# Patient Record
Sex: Female | Born: 1965
Health system: Southern US, Community
[De-identification: ages and names within clinical notes are randomized; demographics above are authoritative.]

## PROBLEM LIST (undated history)

## (undated) DIAGNOSIS — M4310 Spondylolisthesis, site unspecified: Secondary | ICD-10-CM

## (undated) DIAGNOSIS — F329 Major depressive disorder, single episode, unspecified: Secondary | ICD-10-CM

## (undated) DIAGNOSIS — F32A Depression, unspecified: Secondary | ICD-10-CM

## (undated) DIAGNOSIS — R197 Diarrhea, unspecified: Secondary | ICD-10-CM

## (undated) DIAGNOSIS — G8929 Other chronic pain: Secondary | ICD-10-CM

## (undated) DIAGNOSIS — M545 Low back pain, unspecified: Secondary | ICD-10-CM

## (undated) DIAGNOSIS — F419 Anxiety disorder, unspecified: Secondary | ICD-10-CM

## (undated) DIAGNOSIS — M79605 Pain in left leg: Secondary | ICD-10-CM

## (undated) DIAGNOSIS — R748 Abnormal levels of other serum enzymes: Secondary | ICD-10-CM

## (undated) DIAGNOSIS — K589 Irritable bowel syndrome without diarrhea: Secondary | ICD-10-CM

## (undated) DIAGNOSIS — M810 Age-related osteoporosis without current pathological fracture: Secondary | ICD-10-CM

## (undated) DIAGNOSIS — M51369 Other intervertebral disc degeneration, lumbar region without mention of lumbar back pain or lower extremity pain: Secondary | ICD-10-CM

## (undated) DIAGNOSIS — M5136 Other intervertebral disc degeneration, lumbar region: Secondary | ICD-10-CM

## (undated) HISTORY — DX: Low back pain, unspecified: M54.50

## (undated) HISTORY — DX: Diarrhea, unspecified: R19.7

## (undated) HISTORY — PX: TOTAL ABDOMINAL HYSTERECTOMY: SHX209

## (undated) HISTORY — PX: OOPHORECTOMY: SHX86

## (undated) HISTORY — DX: Other intervertebral disc degeneration, lumbar region: M51.36

## (undated) HISTORY — DX: Other intervertebral disc degeneration, lumbar region without mention of lumbar back pain or lower extremity pain: M51.369

## (undated) HISTORY — DX: Other chronic pain: G89.29

## (undated) HISTORY — PX: CHOLECYSTECTOMY: SHX55

## (undated) HISTORY — DX: Age-related osteoporosis without current pathological fracture: M81.0

## (undated) HISTORY — DX: Abnormal levels of other serum enzymes: R74.8

## (undated) HISTORY — DX: Spondylolisthesis, site unspecified: M43.10

## (undated) HISTORY — PX: BREAST LUMPECTOMY: SHX2

---

## 1998-11-16 HISTORY — PX: BACK SURGERY: SHX140

## 2001-06-24 ENCOUNTER — Encounter: Payer: Self-pay | Admitting: Neurology

## 2001-06-24 ENCOUNTER — Encounter: Admission: RE | Admit: 2001-06-24 | Discharge: 2001-06-24 | Payer: Self-pay | Admitting: Neurology

## 2001-06-28 ENCOUNTER — Encounter: Payer: Self-pay | Admitting: Neurology

## 2001-06-28 ENCOUNTER — Encounter: Admission: RE | Admit: 2001-06-28 | Discharge: 2001-06-28 | Payer: Self-pay | Admitting: Neurology

## 2004-07-12 ENCOUNTER — Emergency Department (HOSPITAL_COMMUNITY): Admission: EM | Admit: 2004-07-12 | Discharge: 2004-07-12 | Payer: Self-pay | Admitting: Emergency Medicine

## 2006-03-25 ENCOUNTER — Ambulatory Visit (HOSPITAL_COMMUNITY): Admission: RE | Admit: 2006-03-25 | Discharge: 2006-03-25 | Payer: Self-pay | Admitting: Neurosurgery

## 2008-02-15 ENCOUNTER — Encounter: Payer: Self-pay | Admitting: Obstetrics & Gynecology

## 2008-02-15 ENCOUNTER — Ambulatory Visit (HOSPITAL_COMMUNITY): Admission: RE | Admit: 2008-02-15 | Discharge: 2008-02-15 | Payer: Self-pay | Admitting: Obstetrics & Gynecology

## 2009-09-02 ENCOUNTER — Ambulatory Visit: Payer: Self-pay | Admitting: Occupational Medicine

## 2009-09-02 LAB — CONVERTED CEMR LAB
Basophils Absolute: 0 10*3/uL
Basophils Relative: 0 %
Eosinophils Absolute: 0.2 10*3/uL
Eosinophils Relative: 2 %
HCT: 47 % — ABNORMAL HIGH
Hemoglobin: 15.9 g/dL — ABNORMAL HIGH
Lymphocytes Relative: 35 %
Lymphs Abs: 3.6 10*3/uL
MCHC: 33.8 g/dL
MCV: 99 fL
Monocytes Absolute: 0.2 10*3/uL
Monocytes Relative: 2 % — ABNORMAL LOW
Neutro Abs: 6.2 10*3/uL
Neutrophils Relative %: 61 %
Platelets: 183 10*3/uL
RBC: 4.75 M/uL
RDW: 13.2 %
WBC: 10.2 10*3/uL

## 2009-11-05 ENCOUNTER — Ambulatory Visit (HOSPITAL_COMMUNITY): Admission: RE | Admit: 2009-11-05 | Discharge: 2009-11-05 | Payer: Self-pay | Admitting: Occupational Medicine

## 2009-12-17 ENCOUNTER — Ambulatory Visit (HOSPITAL_COMMUNITY): Admission: RE | Admit: 2009-12-17 | Discharge: 2009-12-17 | Payer: Self-pay | Admitting: Neurology

## 2009-12-24 ENCOUNTER — Emergency Department (HOSPITAL_COMMUNITY): Admission: EM | Admit: 2009-12-24 | Discharge: 2009-12-25 | Payer: Self-pay | Admitting: Emergency Medicine

## 2010-08-10 ENCOUNTER — Emergency Department (HOSPITAL_COMMUNITY): Admission: EM | Admit: 2010-08-10 | Discharge: 2010-08-10 | Payer: Self-pay | Admitting: Emergency Medicine

## 2010-10-15 LAB — HM MAMMOGRAPHY

## 2010-11-18 ENCOUNTER — Inpatient Hospital Stay (HOSPITAL_COMMUNITY): Admission: EM | Admit: 2010-11-18 | Discharge: 2010-11-22 | Payer: Self-pay | Source: Home / Self Care

## 2010-11-19 LAB — DIFFERENTIAL
Basophils Absolute: 0 10*3/uL (ref 0.0–0.1)
Basophils Relative: 0 % (ref 0–1)
Eosinophils Absolute: 0.1 10*3/uL (ref 0.0–0.7)
Eosinophils Relative: 3 % (ref 0–5)
Lymphocytes Relative: 9 % — ABNORMAL LOW (ref 12–46)
Lymphs Abs: 0.4 10*3/uL — ABNORMAL LOW (ref 0.7–4.0)
Monocytes Absolute: 0.7 10*3/uL (ref 0.1–1.0)
Monocytes Relative: 17 % — ABNORMAL HIGH (ref 3–12)
Neutro Abs: 3 10*3/uL (ref 1.7–7.7)
Neutrophils Relative %: 72 % (ref 43–77)

## 2010-11-19 LAB — CBC
HCT: 34.7 % — ABNORMAL LOW (ref 36.0–46.0)
Hemoglobin: 11.7 g/dL — ABNORMAL LOW (ref 12.0–15.0)
MCH: 31.7 pg (ref 26.0–34.0)
MCHC: 33.7 g/dL (ref 30.0–36.0)
MCV: 94 fL (ref 78.0–100.0)
Platelets: 132 10*3/uL — ABNORMAL LOW (ref 150–400)
RBC: 3.69 MIL/uL — ABNORMAL LOW (ref 3.87–5.11)
RDW: 13.6 % (ref 11.5–15.5)
WBC: 4.2 10*3/uL (ref 4.0–10.5)

## 2010-11-19 LAB — IRON AND TIBC
Iron: 35 ug/dL — ABNORMAL LOW (ref 42–135)
Saturation Ratios: 13 % — ABNORMAL LOW (ref 20–55)
TIBC: 269 ug/dL (ref 250–470)
UIBC: 234 ug/dL

## 2010-11-19 LAB — URINALYSIS, ROUTINE W REFLEX MICROSCOPIC
Bilirubin Urine: NEGATIVE
Hemoglobin, Urine: NEGATIVE
Ketones, ur: NEGATIVE mg/dL
Nitrite: NEGATIVE
Protein, ur: NEGATIVE mg/dL
Specific Gravity, Urine: 1.005 (ref 1.005–1.030)
Urine Glucose, Fasting: NEGATIVE mg/dL
Urobilinogen, UA: 0.2 mg/dL (ref 0.0–1.0)
pH: 6 (ref 5.0–8.0)

## 2010-11-19 LAB — COMPREHENSIVE METABOLIC PANEL
ALT: 946 U/L — ABNORMAL HIGH (ref 0–35)
AST: 833 U/L — ABNORMAL HIGH (ref 0–37)
Albumin: 2.8 g/dL — ABNORMAL LOW (ref 3.5–5.2)
Alkaline Phosphatase: 95 U/L (ref 39–117)
BUN: 5 mg/dL — ABNORMAL LOW (ref 6–23)
CO2: 24 mEq/L (ref 19–32)
Calcium: 7.5 mg/dL — ABNORMAL LOW (ref 8.4–10.5)
Chloride: 111 mEq/L (ref 96–112)
Creatinine, Ser: 0.62 mg/dL (ref 0.4–1.2)
GFR calc Af Amer: >60 mL/min (ref >60)
GFR calc non Af Amer: >60 mL/min (ref >60)
Glucose, Bld: 82 mg/dL (ref 70–99)
Potassium: 3.9 mEq/L (ref 3.5–5.1)
Sodium: 141 mEq/L (ref 135–145)
Total Bilirubin: 1.9 mg/dL — ABNORMAL HIGH (ref 0.3–1.2)
Total Protein: 4.9 g/dL — ABNORMAL LOW (ref 6.0–8.3)

## 2010-11-19 LAB — VITAMIN B12: Vitamin B-12: 1134 pg/mL — ABNORMAL HIGH (ref 211–911)

## 2010-11-19 LAB — FERRITIN: Ferritin: 272 ng/mL (ref 10–291)

## 2010-11-19 LAB — PROTIME-INR
INR: 1.29 (ref 0.00–1.49)
Prothrombin Time: 16.3 seconds — ABNORMAL HIGH (ref 11.6–15.2)

## 2010-11-19 LAB — PHOSPHORUS: Phosphorus: 2.5 mg/dL (ref 2.3–4.6)

## 2010-11-19 LAB — MAGNESIUM: Magnesium: 1.5 mg/dL (ref 1.5–2.5)

## 2010-11-20 ENCOUNTER — Other Ambulatory Visit: Payer: Self-pay | Admitting: Internal Medicine

## 2010-11-20 LAB — DIFFERENTIAL
Basophils Absolute: 0 10*3/uL (ref 0.0–0.1)
Basophils Relative: 0 % (ref 0–1)
Eosinophils Absolute: 0 10*3/uL (ref 0.0–0.7)
Eosinophils Relative: 0 % (ref 0–5)
Lymphocytes Relative: 9 % — ABNORMAL LOW (ref 12–46)
Lymphs Abs: 0.6 10*3/uL — ABNORMAL LOW (ref 0.7–4.0)
Monocytes Absolute: 0.4 10*3/uL (ref 0.1–1.0)
Monocytes Relative: 7 % (ref 3–12)
Neutro Abs: 5.3 10*3/uL (ref 1.7–7.7)
Neutrophils Relative %: 84 % — ABNORMAL HIGH (ref 43–77)

## 2010-11-20 LAB — VANCOMYCIN, TROUGH: Vancomycin Tr: 6 ug/mL — ABNORMAL LOW (ref 10.0–20.0)

## 2010-11-20 LAB — COMPREHENSIVE METABOLIC PANEL
ALT: 655 U/L — ABNORMAL HIGH (ref 0–35)
AST: 271 U/L — ABNORMAL HIGH (ref 0–37)
Albumin: 3.1 g/dL — ABNORMAL LOW (ref 3.5–5.2)
Alkaline Phosphatase: 95 U/L (ref 39–117)
BUN: 4 mg/dL — ABNORMAL LOW (ref 6–23)
CO2: 19 mEq/L (ref 19–32)
Calcium: 8.1 mg/dL — ABNORMAL LOW (ref 8.4–10.5)
Chloride: 113 mEq/L — ABNORMAL HIGH (ref 96–112)
Creatinine, Ser: 0.62 mg/dL (ref 0.4–1.2)
GFR calc Af Amer: >60 mL/min (ref >60)
GFR calc non Af Amer: >60 mL/min (ref >60)
Glucose, Bld: 119 mg/dL — ABNORMAL HIGH (ref 70–99)
Potassium: 3.6 mEq/L (ref 3.5–5.1)
Sodium: 139 mEq/L (ref 135–145)
Total Bilirubin: 0.7 mg/dL (ref 0.3–1.2)
Total Protein: 5.7 g/dL — ABNORMAL LOW (ref 6.0–8.3)

## 2010-11-20 LAB — CARDIAC PANEL(CRET KIN+CKTOT+MB+TROPI)
CK, MB: 1.6 ng/mL (ref 0.3–4.0)
CK, MB: 1.5 ng/mL (ref 0.3–4.0)
CK, MB: 3.4 ng/mL (ref 0.3–4.0)
Relative Index: INVALID (ref 0.0–2.5)
Relative Index: INVALID (ref 0.0–2.5)
Relative Index: INVALID (ref 0.0–2.5)
Total CK: 42 U/L (ref 7–177)
Total CK: 35 U/L (ref 7–177)
Total CK: 45 U/L (ref 7–177)
Troponin I: 0.02 ng/mL (ref 0.00–0.06)
Troponin I: 0.01 ng/mL (ref 0.00–0.06)
Troponin I: 0.03 ng/mL (ref 0.00–0.06)

## 2010-11-20 LAB — HEPATITIS PANEL, ACUTE
HCV Ab: NEGATIVE
Hep A IgM: NEGATIVE
Hep B C IgM: NEGATIVE
Hepatitis B Surface Ag: NEGATIVE

## 2010-11-20 LAB — CBC
HCT: 38.2 % (ref 36.0–46.0)
Hemoglobin: 13.1 g/dL (ref 12.0–15.0)
MCH: 31.8 pg (ref 26.0–34.0)
MCHC: 34.3 g/dL (ref 30.0–36.0)
MCV: 92.7 fL (ref 78.0–100.0)
Platelets: 172 10*3/uL (ref 150–400)
RBC: 4.12 MIL/uL (ref 3.87–5.11)
RDW: 13.9 % (ref 11.5–15.5)
WBC: 6.3 10*3/uL (ref 4.0–10.5)

## 2010-11-20 LAB — URINE CULTURE
Colony Count: NO GROWTH
Culture  Setup Time: 201201041340
Culture: NO GROWTH

## 2010-11-20 LAB — FOLATE: Folate: 5.8 ng/mL

## 2010-11-21 LAB — DIFFERENTIAL
Basophils Absolute: 0 10*3/uL (ref 0.0–0.1)
Basophils Relative: 0 % (ref 0–1)
Eosinophils Absolute: 0.1 10*3/uL (ref 0.0–0.7)
Eosinophils Relative: 1 % (ref 0–5)
Lymphocytes Relative: 11 % — ABNORMAL LOW (ref 12–46)
Lymphs Abs: 1.1 10*3/uL (ref 0.7–4.0)
Monocytes Absolute: 1 10*3/uL (ref 0.1–1.0)
Monocytes Relative: 10 % (ref 3–12)
Neutro Abs: 7.6 10*3/uL (ref 1.7–7.7)
Neutrophils Relative %: 77 % (ref 43–77)

## 2010-11-21 LAB — COMPREHENSIVE METABOLIC PANEL
ALT: 422 U/L — ABNORMAL HIGH (ref 0–35)
AST: 81 U/L — ABNORMAL HIGH (ref 0–37)
Albumin: 3.2 g/dL — ABNORMAL LOW (ref 3.5–5.2)
Alkaline Phosphatase: 88 U/L (ref 39–117)
BUN: 2 mg/dL — ABNORMAL LOW (ref 6–23)
CO2: 25 mEq/L (ref 19–32)
Calcium: 8.3 mg/dL — ABNORMAL LOW (ref 8.4–10.5)
Chloride: 113 mEq/L — ABNORMAL HIGH (ref 96–112)
Creatinine, Ser: 0.61 mg/dL (ref 0.4–1.2)
GFR calc Af Amer: >60 mL/min (ref >60)
GFR calc non Af Amer: >60 mL/min (ref >60)
Glucose, Bld: 94 mg/dL (ref 70–99)
Potassium: 3.1 mEq/L — ABNORMAL LOW (ref 3.5–5.1)
Sodium: 143 mEq/L (ref 135–145)
Total Bilirubin: 1.1 mg/dL (ref 0.3–1.2)
Total Protein: 5.7 g/dL — ABNORMAL LOW (ref 6.0–8.3)

## 2010-11-21 LAB — CBC
HCT: 37.9 % (ref 36.0–46.0)
Hemoglobin: 13.2 g/dL (ref 12.0–15.0)
MCH: 31.7 pg (ref 26.0–34.0)
MCHC: 34.8 g/dL (ref 30.0–36.0)
MCV: 91.1 fL (ref 78.0–100.0)
Platelets: 182 10*3/uL (ref 150–400)
RBC: 4.16 MIL/uL (ref 3.87–5.11)
RDW: 13.6 % (ref 11.5–15.5)
WBC: 9.9 10*3/uL (ref 4.0–10.5)

## 2010-11-21 LAB — MAGNESIUM: Magnesium: 1.5 mg/dL (ref 1.5–2.5)

## 2010-12-01 LAB — COMPREHENSIVE METABOLIC PANEL
ALT: 292 U/L — ABNORMAL HIGH (ref 0–35)
AST: 38 U/L — ABNORMAL HIGH (ref 0–37)
Albumin: 2.8 g/dL — ABNORMAL LOW (ref 3.5–5.2)
Alkaline Phosphatase: 77 U/L (ref 39–117)
BUN: 4 mg/dL — ABNORMAL LOW (ref 6–23)
CO2: 28 mEq/L (ref 19–32)
Calcium: 8.6 mg/dL (ref 8.4–10.5)
Chloride: 107 mEq/L (ref 96–112)
Creatinine, Ser: 0.59 mg/dL (ref 0.4–1.2)
GFR calc Af Amer: >60 mL/min (ref >60)
GFR calc non Af Amer: >60 mL/min (ref >60)
Glucose, Bld: 85 mg/dL (ref 70–99)
Potassium: 3.4 mEq/L — ABNORMAL LOW (ref 3.5–5.1)
Sodium: 139 mEq/L (ref 135–145)
Total Bilirubin: 0.8 mg/dL (ref 0.3–1.2)
Total Protein: 5.1 g/dL — ABNORMAL LOW (ref 6.0–8.3)

## 2010-12-01 LAB — CBC
HCT: 36.4 % (ref 36.0–46.0)
Hemoglobin: 12.7 g/dL (ref 12.0–15.0)
MCH: 31.8 pg (ref 26.0–34.0)
MCHC: 34.9 g/dL (ref 30.0–36.0)
MCV: 91 fL (ref 78.0–100.0)
Platelets: 171 10*3/uL (ref 150–400)
RBC: 4 MIL/uL (ref 3.87–5.11)
RDW: 13.5 % (ref 11.5–15.5)
WBC: 6.3 10*3/uL (ref 4.0–10.5)

## 2010-12-01 LAB — MAGNESIUM: Magnesium: 1.7 mg/dL (ref 1.5–2.5)

## 2010-12-01 LAB — DIFFERENTIAL
Basophils Absolute: 0 10*3/uL (ref 0.0–0.1)
Basophils Relative: 0 % (ref 0–1)
Eosinophils Absolute: 0.2 10*3/uL (ref 0.0–0.7)
Eosinophils Relative: 4 % (ref 0–5)
Lymphocytes Relative: 27 % (ref 12–46)
Lymphs Abs: 1.7 10*3/uL (ref 0.7–4.0)
Monocytes Absolute: 0.7 10*3/uL (ref 0.1–1.0)
Monocytes Relative: 11 % (ref 3–12)
Neutro Abs: 3.7 10*3/uL (ref 1.7–7.7)
Neutrophils Relative %: 58 % (ref 43–77)

## 2010-12-08 ENCOUNTER — Ambulatory Visit
Admission: RE | Admit: 2010-12-08 | Discharge: 2010-12-08 | Payer: Self-pay | Source: Home / Self Care | Attending: Internal Medicine | Admitting: Internal Medicine

## 2010-12-12 NOTE — Discharge Summary (Addendum)
NAME:  Shelia Snyder, Shelia Snyder NO.:  [id number]  MEDICAL RECORD NO.:  [id number]          PATIENT TYPE:  INP  LOCATION:  A201                          FACILITY:  [facility]  PHYSICIAN:  Shelia Harvest, MD    DATE OF BIRTH:  1966-08-22  DATE OF ADMISSION:  11/18/2010 DATE OF DISCHARGE:  01/07/2012LH                         DISCHARGE SUMMARY-REFERRING   PRIMARY CARE PHYSICIAN:  Dr. Loney Snyder of [facility]  GASTROENTEROLOGIST:  Dr. Lionel Snyder  DISCHARGE DIAGNOSES: 1. Hypotension resolved. 2. Probable sepsis resolved. 3. Transaminitis likely secondary to shock liver plus or minus     choledocholithiasis. 4. Hypokalemia repleted. 5. Iron-deficiency anemia stable. 6. Gastroesophageal reflux disease. 7. Chest pain resolved. 8. Anxiety. 9. Status post cholecystectomy. 10.Chronic back pain. 11.Status post hysterectomy. 12.Status post laparoscopy. 13.Status post ERCP for common bile duct stone done in 2011 at     [facility], Dr. Karilyn Snyder. 14.Status post back surgery on L4-5 secondary to a ruptured disk.  DISCHARGE MEDICATIONS: 1. Colace 100 mg p.o. b.i.d. 2. Levaquin 500 mg p.o. daily x4 days. 3. Oxycodone 5 mg p.o. q.4 hours p.r.n. 4. Urso 500 mg p.o. b.i.d. to start in 1 week. 5. Ibuprofen 200 mg p.o. q.4 hours p.r.n. 6. Neurontin 600 mg p.o. t.i.d. 7. Paxil 20 mg p.o. daily. 8. Xanax 1 mg 1/2 to 1 tablet p.o. t.i.d. p.r.n.  DISPOSITION AND FOLLOWUP:  The patient will be discharged home.  The patient is to follow up with PCP in 1-2 weeks.  On followup, the patient will need a repeat CMET done to follow up on her liver function enzymes as well as electrolytes and renal function.  The patient's hydrocodone/acetaminophen was discontinued secondary to elevated LFTs and the patient has now been placed on oxycodone.  We will defer further management to her PCP.  The patient is to follow up with Dr. Karilyn Snyder in 2 weeks to a follow up on her transaminitis at  which point in time repeat LFTs will be done and any further workup per Dr. Karilyn Snyder will be decided at that time.  CONSULTATIONS DONE:  A GI consultation was done.  The patient was seen by Dr. Karilyn Snyder on November 19, 2010.  PROCEDURES PERFORMED: 1. An abdominal x-ray was done November 18, 2010, that showed prominent     stool in the distal half of the colon.  No additional acute     findings. 2. CT of the abdomen and pelvis done on November 18, 2010, showed mild     periportal edema within the liver which is nonspecific, small right     ovarian cyst 1.9 x 1.4 cm.  No additional intra-abdominal or     intrapelvic abnormalities. 3. Abdominal ultrasound done on November 18, 2010, showed no acute     abnormalities, upper limits of normal common bile duct measuring 10     mm, cholecystectomy. 4. An MRCP was done on November 19, 2010, which showed apparent interval     decrease in caliber of the common duct without internal focal     filling defect identified.  This could reflect a recently passed  stone, mild periportal edema persists, trace effusions with     associated compressive atelectasis. 5. A 2-D echo was done on November 20, 2010, that showed a normal left     ventricular cavity size.  Wall thickness was normal.  Systolic     function was normal.  EF of 55% to 60%.  Wall motion was normal.     There were no regional wall motion abnormalities.  Mild mitral     valvular regurgitation.  Atrial septum with a minimal patent     foramen ovale.  In the pericardium, there was a small right pleural     effusion.  BRIEF ADMISSION HISTORY AND PHYSICAL:  Shelia Snyder is a pleasant 44-year Caucasian female with history of cholecystectomy, chronic back pain, anxiety disorder, gastroesophageal reflux disease status post ERCP early last year per Dr. Renae Snyder, status post sphincterotomy per patient.  Comes in on the day of admission with complaints of nausea, vomiting, diarrhea ongoing for the past 4 days  also associated with some dizziness.  The patient stated that she had acute attacks of her liver enzymes going up and it usually presents like this but this time it is different because she also had a fever.  She initially thought she had a viral infection. Diarrhea was watery with no blood.  Denied any cough.  Symptoms were associated with diffuse abdominal pain which she states is worse when she stretches.  The patient denies taking large amounts of Tylenol.  She denied any other medications.  When she presented to the ED, she was found to be hypotensive with systolic blood pressure in the 70s.  She also had a transaminitis with a AST of 1640 and an alk phosphatase of 169, [place] of [address].  Her lipase was normal.  White count was 2.7.  Of note is that her liver function tests were normal in September.  She subsequently had a CT of the abdomen and pelvis as well as abdominal ultrasound which showed mild periportal edema within the liver which was nonspecific and also showed a small right ovarian cyst 1.9 x 1.4 cm. Abdominal ultrasound showed no acute abnormalities.  Common bile duct was 10 mm.  She is status post cholecystectomy in the ED.  The patient had received 3 liters of normal saline with a blood pressure improving into the 90s systolic.  She had also been given some ciprofloxacin, Motrin, Flagyl, morphine sulfate, and Zofran, and felt slightly better. For the rest of admission history and physical, please see H and P dictated per Dr. Venetia Snyder, job number [id number].  HOSPITAL COURSE: 1. Hypotension.  The patient was admitted and was found to be     hypotensive.  She was initially placed in the ICU.  Her hypotension     was felt to be likely multifactorial secondary to hypovolemia and     probable sepsis/infection as the patient did have a fever, nausea     or vomiting, abdominal pain, and elevated LFTs.  There was concern     of a possible ascending cholangitis.  The patient was placed  on IV     fluids.  The blood cultures were obtained which had no growth to     date by the day of discharge.  Urinalysis was also obtained which     was negative.  Chest x-ray was obtained which was negative for any     infiltrate.  Stool studies were ordered; however, the patient did     not  have any further diarrhea during the hospitalization.  The     patient was placed on IV fluids, also placed on empiric IV     vancomycin and Zosyn, and followed.  She was also placed on stress-     dose steroids of Solu-Cortef for her hypotension.  The patient was     monitored.  The patient's blood pressures slowly improved.  The     patient improved clinically.  During the hospitalization, her blood     pressure stabilized with systolics greater than 120s.  The IV Solu-     Cortef was subsequently discontinued, IV fluids were decreased, and     subsequently saline locked.  Her blood pressure remained stable.     She was subsequently transitioned to oral Levaquin and the patient     was tolerating p.o.  Her hypotension had resolved by day of     discharge and the patient was discharged home in stable and     improved condition. 2. Probable sepsis.  On admission, there was a question of whether the     patient may likely have probable sepsis with her presenting     symptoms of nausea and vomiting, abdominal pain, fever, elevated     transaminitis.  Urinalysis which was obtained was negative.  Chest     x-ray which was obtained was negative for any infiltrate.  Blood     cultures were pending at the time of discharge; however, had no     growth to date.  The patient was empirically placed on IV     vancomycin and Zosyn.  There was a question of ascending     cholangitis and, as such, a GI consultation was done.  The patient     was seen in consultation by Dr. Karilyn Snyder on November 19, 2010, where an     MRCP was subsequently ordered with the results of the MRCP as     stated above.  There was a question  of whether the patient might     have passed a stone.  The patient remained afebrile during the     hospitalization and ended up having a normalized white count.     Intravenous antibiotics were subsequently de-escalated.  Vancomycin     was discontinued.  Zosyn was subsequently switched to oral Levaquin     which the patient tolerated well.  The patient will be discharged     home on 4 more days of oral Levaquin to complete a 1-week course of     antibiotic therapy.  The patient will be discharged home in stable     and improved condition. 3. Transaminitis.  On admission, the patient was noted to have a     transaminitis.  Her alkaline phosphatase on day of admission was     169, her AST was 1640, her ALT was 1160.  It was felt that the     patient's transaminitis could likely be multifactorial secondary to     shock liver, secondary to her nausea, vomiting, and hypotension, as     well as a question of a possible ascending cholangitis.  The     patient was hydrated with IV fluids.  A GI consultation was done.     The patient was seen in consultation by Dr. Renae Snyder on November 19, 2010, at which time an MRCP was ordered.  MRCP with results as     stated above showed an  interval decrease in the caliber of the     common bile duct stone. Common bile ducts are without internal     focal filling defect.  It was felt the patient may have passed a     stone.  Her transaminitis/LFTs trended down during the     hospitalization with IV fluids and empiric antibiotics for     questionable possible ascending cholangitis.  This patient remained     afebrile for the rest of the hospitalization.  Her LFTs trended     down.  Her alk phosphatase went down from 169 and normalized at 77.     Her AST went down from 1640 down to 38 by the day of discharge.     Her ALT went down from 1160 down to 292 on day of discharge.  The     patient improved clinically.  Her nausea, vomiting, and abdominal     pain  improved during the hospitalization.  She was initially placed     on clear liquids and subsequently advanced as tolerated.  The     patient was able to tolerate a regular diet by day of discharge.     Abdominal pain had resolved and nausea and vomiting had also     resolved.  It was recommended per GI to start the patient on Urso     500 mg twice daily 1 week post discharge with a close followup with     Dr. Karilyn Snyder in 2 weeks at which point in time a comprehensive     metabolic profile will be obtained to follow up on the patient's     LFTs.  The patient improved clinically and symptomatically during     the hospitalization and will be discharged home in stable and     improved condition. 4. Hypokalemia.  During the hospitalization, the patient was noted to     be hypokalemic.  Her magnesium level was checked secondary to the     patient's hypokalemia and came back at 1.5.  The patient was given     some magnesium sulfate during this hospitalization and her     potassium was repleted.  The patient will need a followup BMET done     to follow up on her electrolytes. 5. Chest pain.  During the hospitalization, the patient was noted to     have chest pain.  It was felt this was likely secondary to anxiety.     EKG which was done did not have any significant ST-T wave changes.     The patient was given some IV Ativan with resolution in her     symptoms.  Cardiac enzymes were cycled which were negative x3.  2-D     echo which was obtained with results as stated above had a normal     EF with no wall motion abnormalities.  The patient did not have any     further chest pain during the rest of the hospitalization and will     be discharged home in stable and improved condition.  The rest of the patient's chronic medical issues remained stable throughout the hospitalization.  On the day of discharge, vital signs: Temperature 97.8, pulse of 65, respirations 18, blood pressure 120/75, satting  94% on room air.  DISCHARGE LABS:  CBC:  White count 6.3, hemoglobin 12.7, hematocrit 36.4, platelet count of 171, with an ANC of 3.7, magnesium level of 1.7. CMET on day of discharge:  Sodium  139, potassium 3.4, chloride 107, bicarb 28, glucose 85, BUN 4, creatinine 0.59, bilirubin of 0.8, alk phosphatase 77, AST 38, ALT 292, protein of 5.1, albumin of 2.8, and a calcium of 8.6.  It has been a pleasure taking care of Ms. Calmes.     Shelia Harvest, MD     DT/MEDQ  D:  11/22/2010  T:  11/22/2010  Job:  [id number]  cc:   Shelia Snyder, M.D. Fax: [phone]  Dr. Loney Snyder, [facility]  Electronically Signed by Shelia Harvest MD on 12/12/2010 12:17:25 PM

## 2011-01-26 LAB — RAPID URINE DRUG SCREEN, HOSP PERFORMED
Amphetamines: NEGATIVE — AB
Barbiturates: NEGATIVE — AB
Benzodiazepines: POSITIVE — AB
Cocaine: NEGATIVE — AB
Opiates: POSITIVE — AB
Tetrahydrocannabinol: NEGATIVE — AB

## 2011-01-26 LAB — URINE CULTURE
Colony Count: NO GROWTH
Culture  Setup Time: 201201040118
Culture: NO GROWTH

## 2011-01-26 LAB — CULTURE, BLOOD (ROUTINE X 2)
Culture: NO GROWTH
Culture: NO GROWTH

## 2011-01-26 LAB — CBC
MCV: 91.2 fL (ref 78.0–100.0)
Platelets: 186 10*3/uL (ref 150–400)
RBC: 5.01 MIL/uL (ref 3.87–5.11)
RDW: 13 % (ref 11.5–15.5)
WBC: 2.7 10*3/uL — ABNORMAL LOW (ref 4.0–10.5)

## 2011-01-26 LAB — URINE MICROSCOPIC-ADD ON

## 2011-01-26 LAB — BLOOD GAS, ARTERIAL
Acid-base deficit: 2.6 mmol/L — ABNORMAL HIGH (ref 0.0–2.0)
Bicarbonate: 23.4 mEq/L (ref 20.0–24.0)
O2 Content: 3 L/min
O2 Saturation: 97.6 %
Patient temperature: 37
TCO2: 21.4 mmol/L (ref 0–100)
pCO2 arterial: 53.5 mmHg — ABNORMAL HIGH (ref 35.0–45.0)
pH, Arterial: 7.263 — ABNORMAL LOW (ref 7.350–7.400)
pO2, Arterial: 116 mmHg — ABNORMAL HIGH (ref 80.0–100.0)

## 2011-01-26 LAB — URINALYSIS, ROUTINE W REFLEX MICROSCOPIC
Glucose, UA: NEGATIVE mg/dL
Nitrite: NEGATIVE
Specific Gravity, Urine: 1.015 (ref 1.005–1.030)
Urobilinogen, UA: 0.2 mg/dL (ref 0.0–1.0)
pH: 8.5 — ABNORMAL HIGH (ref 5.0–8.0)

## 2011-01-26 LAB — BASIC METABOLIC PANEL
BUN: 11 mg/dL (ref 6–23)
Glucose, Bld: 93 mg/dL (ref 70–99)

## 2011-01-26 LAB — LACTIC ACID, PLASMA: Lactic Acid, Venous: 2.4 mmol/L — ABNORMAL HIGH (ref 0.5–2.2)

## 2011-01-26 LAB — APTT: aPTT: 27 seconds (ref 24–37)

## 2011-01-26 LAB — HEPATIC FUNCTION PANEL
Albumin: 4.2 g/dL (ref 3.5–5.2)
Alkaline Phosphatase: 169 U/L — ABNORMAL HIGH (ref 39–117)
Total Protein: 6.9 g/dL (ref 6.0–8.3)

## 2011-01-26 LAB — MRSA PCR SCREENING: MRSA by PCR: NEGATIVE

## 2011-01-26 LAB — PROTIME-INR
INR: 1.14 (ref 0.00–1.49)
Prothrombin Time: 14.8 seconds (ref 11.6–15.2)

## 2011-01-29 LAB — COMPREHENSIVE METABOLIC PANEL
Alkaline Phosphatase: 51 U/L (ref 39–117)
CO2: 28 mEq/L (ref 19–32)
Calcium: 9.2 mg/dL (ref 8.4–10.5)
Chloride: 104 mEq/L (ref 96–112)
Creatinine, Ser: 0.68 mg/dL (ref 0.4–1.2)
GFR calc Af Amer: >60 mL/min (ref >60)
GFR calc non Af Amer: >60 mL/min (ref >60)
Potassium: 4.1 mEq/L (ref 3.5–5.1)
Sodium: 138 mEq/L (ref 135–145)
Total Bilirubin: 0.4 mg/dL (ref 0.3–1.2)

## 2011-01-29 LAB — URINALYSIS, ROUTINE W REFLEX MICROSCOPIC
Bilirubin Urine: NEGATIVE
Glucose, UA: NEGATIVE mg/dL
Hgb urine dipstick: NEGATIVE
Nitrite: NEGATIVE
Specific Gravity, Urine: <1.005 — ABNORMAL LOW (ref 1.005–1.030)

## 2011-01-29 LAB — CBC
Hemoglobin: 14.1 g/dL (ref 12.0–15.0)
MCH: 32 pg (ref 26.0–34.0)
MCHC: 33.9 g/dL (ref 30.0–36.0)

## 2011-02-21 ENCOUNTER — Emergency Department (HOSPITAL_COMMUNITY)
Admission: EM | Admit: 2011-02-21 | Discharge: 2011-02-21 | Disposition: A | Discharge status: Home / Self Care | Payer: 59 | Attending: Emergency Medicine | Admitting: Emergency Medicine

## 2011-02-21 DIAGNOSIS — Z79899 Other long term (current) drug therapy: Secondary | ICD-10-CM | POA: Insufficient documentation

## 2011-02-21 DIAGNOSIS — L259 Unspecified contact dermatitis, unspecified cause: Secondary | ICD-10-CM | POA: Insufficient documentation

## 2011-02-21 DIAGNOSIS — L299 Pruritus, unspecified: Secondary | ICD-10-CM | POA: Insufficient documentation

## 2011-03-31 NOTE — Op Note (Signed)
NAME:  Shelia Snyder, Shelia Snyder                  ACCOUNT NO.:  [id number]   MEDICAL RECORD NO.:  [id number]          PATIENT TYPE:  AMB   LOCATION:  DAY                           FACILITY:  [facility]   PHYSICIAN:  Lazaro Arms, M.D.   DATE OF BIRTH:  November 29, 1965   DATE OF PROCEDURE:  02/15/2008  DATE OF DISCHARGE:                               OPERATIVE REPORT   PREOPERATIVE DIAGNOSIS:  Persistent right Bartholin cyst.   POSTOPERATIVE DIAGNOSIS:  Persistent right Bartholin cyst.   PROCEDURE:  Excision of right Bartholin cyst.   SURGEON:  Dr. Lazaro Arms.   ANESTHESIA:  General endotracheal anesthesia.   FINDINGS:  The patient has had a right Bartholin cyst for some time.  It  has been infected several times, treated with antibiotics.  It is  persistent.  It is not infected at the present time.  She wants it  excised.  This occasionally drains and causes her pain.   DESCRIPTION OF PROCEDURE:  The patient was taken to the operating room  and placed in the supine position where she underwent general  endotracheal anesthesia.  Placed in the lithotomy position.  Prepped and  draped in the usual sterile fashion.  An incision was made on the right  vulva.  It was a pretty deep Bartholin cyst.  It was not really pointing  towards the vagina at all, so I made the incision on the vulvar side.  I  dissected down to the cyst.  I used blunt dissection to get around the  cyst and remove it intact in total.  The course of the perineal artery  was severed and had to be sutured with two figure-of-eight sutures.  The  bed was essentially dry at this point.  The bed was then closed in three  layers, each layer being deep and bringing it more superficial.  The  skin was closed using #3-0 Monocryl on the vulvar side.  There was a  small rent in the vaginal side of the vulva and that was also closed  with #3-0 Monocryl.  It was hemostatic.  There was no swelling.  There  was no feeling of filling up of blood into  the retroperitoneal space for  the rectovaginal space.   The patient tolerated the procedure well.  She experienced about 150 mL  of blood loss.  She was taken to the recovery room in good stable  condition.  All counts were correct.      Lazaro Arms, M.D.  Electronically Signed     LHE/MEDQ  D:  02/15/2008  T:  02/15/2008  Job:  [id number]

## 2011-08-10 LAB — COMPREHENSIVE METABOLIC PANEL
ALT: 27
Albumin: 4.3
CO2: 31
Calcium: 9.7
Chloride: 101
Creatinine, Ser: 0.81
GFR calc non Af Amer: >60
Sodium: 138
Total Bilirubin: 0.5

## 2011-08-10 LAB — DIFFERENTIAL
Basophils Absolute: 0
Basophils Relative: 0
Eosinophils Relative: 3
Lymphocytes Relative: 14
Monocytes Absolute: 0.8
Monocytes Relative: 8
Neutro Abs: 7.5

## 2011-08-10 LAB — URINALYSIS, ROUTINE W REFLEX MICROSCOPIC
Ketones, ur: NEGATIVE
Protein, ur: NEGATIVE
Specific Gravity, Urine: <1.005 — ABNORMAL LOW
Urobilinogen, UA: 0.2

## 2011-08-10 LAB — CBC
MCHC: 34.7
MCV: 94.6

## 2011-09-01 ENCOUNTER — Encounter (INDEPENDENT_AMBULATORY_CARE_PROVIDER_SITE_OTHER): Payer: Self-pay | Admitting: Internal Medicine

## 2011-09-01 ENCOUNTER — Encounter (HOSPITAL_COMMUNITY): Payer: Self-pay | Admitting: *Deleted

## 2011-09-01 ENCOUNTER — Encounter (HOSPITAL_COMMUNITY): Payer: Self-pay

## 2011-09-01 ENCOUNTER — Ambulatory Visit (INDEPENDENT_AMBULATORY_CARE_PROVIDER_SITE_OTHER): Payer: 59 | Admitting: Internal Medicine

## 2011-09-01 ENCOUNTER — Inpatient Hospital Stay (HOSPITAL_COMMUNITY)
Admission: EM | Admit: 2011-09-01 | Discharge: 2011-09-05 | DRG: 440 | Disposition: A | Discharge status: Home / Self Care | Payer: 59 | Attending: Internal Medicine | Admitting: Internal Medicine

## 2011-09-01 VITALS — BP 106/60 | HR 96 | Temp 97.8°F | Ht 66.0 in | Wt 153.9 lb

## 2011-09-01 DIAGNOSIS — R197 Diarrhea, unspecified: Secondary | ICD-10-CM

## 2011-09-01 DIAGNOSIS — R945 Abnormal results of liver function studies: Secondary | ICD-10-CM

## 2011-09-01 DIAGNOSIS — R748 Abnormal levels of other serum enzymes: Secondary | ICD-10-CM

## 2011-09-01 DIAGNOSIS — R7402 Elevation of levels of lactic acid dehydrogenase (LDH): Secondary | ICD-10-CM | POA: Diagnosis present

## 2011-09-01 DIAGNOSIS — D696 Thrombocytopenia, unspecified: Secondary | ICD-10-CM | POA: Diagnosis not present

## 2011-09-01 DIAGNOSIS — E860 Dehydration: Secondary | ICD-10-CM | POA: Diagnosis present

## 2011-09-01 DIAGNOSIS — E876 Hypokalemia: Secondary | ICD-10-CM | POA: Diagnosis present

## 2011-09-01 DIAGNOSIS — F411 Generalized anxiety disorder: Secondary | ICD-10-CM | POA: Diagnosis present

## 2011-09-01 DIAGNOSIS — R109 Unspecified abdominal pain: Secondary | ICD-10-CM | POA: Diagnosis present

## 2011-09-01 DIAGNOSIS — R7401 Elevation of levels of liver transaminase levels: Secondary | ICD-10-CM | POA: Diagnosis present

## 2011-09-01 DIAGNOSIS — R031 Nonspecific low blood-pressure reading: Secondary | ICD-10-CM | POA: Diagnosis present

## 2011-09-01 DIAGNOSIS — K859 Acute pancreatitis without necrosis or infection, unspecified: Principal | ICD-10-CM | POA: Diagnosis present

## 2011-09-01 DIAGNOSIS — K589 Irritable bowel syndrome without diarrhea: Secondary | ICD-10-CM | POA: Diagnosis present

## 2011-09-01 DIAGNOSIS — R7989 Other specified abnormal findings of blood chemistry: Secondary | ICD-10-CM | POA: Diagnosis present

## 2011-09-01 DIAGNOSIS — F419 Anxiety disorder, unspecified: Secondary | ICD-10-CM | POA: Diagnosis present

## 2011-09-01 HISTORY — DX: Pain in left leg: M79.605

## 2011-09-01 HISTORY — DX: Anxiety disorder, unspecified: F41.9

## 2011-09-01 HISTORY — DX: Depression, unspecified: F32.A

## 2011-09-01 HISTORY — DX: Major depressive disorder, single episode, unspecified: F32.9

## 2011-09-01 HISTORY — DX: Irritable bowel syndrome without diarrhea: K58.9

## 2011-09-01 LAB — URINALYSIS, ROUTINE W REFLEX MICROSCOPIC
Leukocytes, UA: NEGATIVE
Nitrite: NEGATIVE
Specific Gravity, Urine: 1.025 (ref 1.005–1.030)
Urobilinogen, UA: 0.2 mg/dL (ref 0.0–1.0)

## 2011-09-01 LAB — PHOSPHORUS: Phosphorus: 2.6 mg/dL (ref 2.3–4.6)

## 2011-09-01 LAB — HEPATIC FUNCTION PANEL
ALT: 425 U/L — ABNORMAL HIGH (ref 0–35)
Bilirubin, Direct: 0.2 mg/dL (ref 0.0–0.3)
Indirect Bilirubin: 0.5 mg/dL (ref 0.3–0.9)
Total Bilirubin: 0.7 mg/dL (ref 0.3–1.2)

## 2011-09-01 LAB — CBC
Hemoglobin: 13.8 g/dL (ref 12.0–15.0)
MCHC: 32.9 g/dL (ref 30.0–36.0)
Platelets: 162 10*3/uL (ref 150–400)

## 2011-09-01 LAB — URINE MICROSCOPIC-ADD ON

## 2011-09-01 LAB — DIFFERENTIAL
Basophils Absolute: 0 10*3/uL (ref 0.0–0.1)
Basophils Relative: 0 % (ref 0–1)
Eosinophils Absolute: 0 10*3/uL (ref 0.0–0.7)
Monocytes Relative: 2 % — ABNORMAL LOW (ref 3–12)
Neutro Abs: 4.5 10*3/uL (ref 1.7–7.7)
Neutrophils Relative %: 95 % — ABNORMAL HIGH (ref 43–77)

## 2011-09-01 LAB — CREATININE, SERUM: GFR calc non Af Amer: >90 mL/min (ref >90)

## 2011-09-01 LAB — BASIC METABOLIC PANEL
BUN: 8 mg/dL (ref 6–23)
Calcium: 9.4 mg/dL (ref 8.4–10.5)
GFR calc Af Amer: >90 mL/min (ref >90)
GFR calc non Af Amer: >90 mL/min (ref >90)
Potassium: 3.1 mEq/L — ABNORMAL LOW (ref 3.5–5.1)
Sodium: 141 mEq/L (ref 135–145)

## 2011-09-01 MED ORDER — SODIUM CHLORIDE 0.9 % IV BOLUS (SEPSIS)
1000.0000 mL | Freq: Once | INTRAVENOUS | Status: AC
  Administered 2011-09-01: 1000 mL via INTRAVENOUS

## 2011-09-01 MED ORDER — PAROXETINE HCL 20 MG PO TABS
20.0000 mg | ORAL_TABLET | Freq: Every day | ORAL | Status: DC
  Administered 2011-09-01 – 2011-09-05 (×5): 20 mg via ORAL
  Filled 2011-09-01 (×5): qty 1

## 2011-09-01 MED ORDER — HYDROMORPHONE HCL 1 MG/ML IJ SOLN
1.0000 mg | INTRAMUSCULAR | Status: DC | PRN
  Filled 2011-09-01: qty 1

## 2011-09-01 MED ORDER — ONDANSETRON HCL 4 MG PO TABS: 4.0000 mg | ORAL_TABLET | Freq: Four times a day (QID) | ORAL | Status: DC | PRN

## 2011-09-01 MED ORDER — ONDANSETRON HCL 4 MG/2ML IJ SOLN: 4.0000 mg | Freq: Three times a day (TID) | INTRAMUSCULAR | Status: DC | PRN

## 2011-09-01 MED ORDER — HYDROMORPHONE HCL 1 MG/ML IJ SOLN
1.0000 mg | Freq: Once | INTRAMUSCULAR | Status: AC
  Administered 2011-09-01: 1 mg via INTRAVENOUS

## 2011-09-01 MED ORDER — HYDROMORPHONE HCL 1 MG/ML IJ SOLN
1.0000 mg | INTRAMUSCULAR | Status: DC | PRN
  Administered 2011-09-01 – 2011-09-02 (×3): 1 mg via INTRAVENOUS
  Administered 2011-09-03: 2 mg via INTRAVENOUS
  Administered 2011-09-04 (×2): 1 mg via INTRAVENOUS
  Filled 2011-09-01: qty 1
  Filled 2011-09-01: qty 2
  Filled 2011-09-01 (×4): qty 1

## 2011-09-01 MED ORDER — KCL IN DEXTROSE-NACL 40-5-0.9 MEQ/L-%-% IV SOLN
INTRAVENOUS | Status: DC
  Administered 2011-09-01: 1000 mL via INTRAVENOUS
  Filled 2011-09-01 (×5): qty 1000

## 2011-09-01 MED ORDER — ENOXAPARIN SODIUM 40 MG/0.4ML ~~LOC~~ SOLN
40.0000 mg | SUBCUTANEOUS | Status: DC
Start: 2011-09-01 — End: 2011-09-05
  Administered 2011-09-01 – 2011-09-04 (×4): 40 mg via SUBCUTANEOUS
  Filled 2011-09-01 (×4): qty 0.4

## 2011-09-01 MED ORDER — ONDANSETRON HCL 4 MG/2ML IJ SOLN
4.0000 mg | Freq: Four times a day (QID) | INTRAMUSCULAR | Status: DC | PRN
  Administered 2011-09-04: 4 mg via INTRAVENOUS
  Filled 2011-09-01: qty 2

## 2011-09-01 MED ORDER — DIPHENHYDRAMINE HCL 50 MG/ML IJ SOLN
25.0000 mg | Freq: Once | INTRAMUSCULAR | Status: AC
Start: 2011-09-01 — End: 2011-09-01
  Administered 2011-09-01: 25 mg via INTRAVENOUS
  Filled 2011-09-01: qty 1

## 2011-09-01 MED ORDER — HYDROMORPHONE HCL 1 MG/ML IJ SOLN
0.5000 mg | Freq: Once | INTRAMUSCULAR | Status: AC
  Administered 2011-09-01: 0.5 mg via INTRAVENOUS
  Filled 2011-09-01: qty 1

## 2011-09-01 MED ORDER — ONDANSETRON HCL 4 MG/2ML IJ SOLN
4.0000 mg | Freq: Once | INTRAMUSCULAR | Status: AC
  Administered 2011-09-01: 4 mg via INTRAVENOUS
  Filled 2011-09-01: qty 2

## 2011-09-01 MED ORDER — SODIUM CHLORIDE 0.9 % IV SOLN: 999.0000 mL | INTRAVENOUS | Status: AC

## 2011-09-01 NOTE — Progress Notes (Signed)
Subjective:     Patient ID: Shelia Snyder, female   DOB: 09-18-1966, 45 y.o.   MRN: [id number]  [facility] presents today with rt upper quadrant which started a couple of hours ago. She has had diarrhea for about 3 weeks.  She is having  3-6 stools a day.  Stools are loose and watery and  Brownish yellow in color.  She has not had an appetite for a couple of weeks. She c/o nausea.  She has been tomato soup and crackers. She has a history of elevated transaminases. Her visit was in January of this year. She was admitted to [facility] in January with nausea, vomiting, diarrhea and she also had elevated transaminases.   She had an [place] which was negative for choledocholithiasis. She has had 2 ERCPs in the past, most recently about a year ago and one about  6 1/2 yrs ago.  The first ERCP was done at [facility] and the second one was by Dr. Karilyn Cota at [facility]. Her stools studies were negative but she was empirically treated with Cipro and Flagyl. During her hospitalization her transaminases came down abut were not normal. She also underwent an MRCP w/wo CM in January of this year which revealed apparent interval decrease in caliber of the common duct without internal focal filing defect identified. This could reflect a recently passed stone. Mild periportal edema persists. 1/202/212 ALP 75, AAST 20, totl bil 0.6, ALT 26. Review of Systems see hpi     Objective:   Physical Exam Alert and oriented. Skin warm and dry. Oral mucosa is dry.  Natural teeth in good condition. Sclera anicteric, conjunctivae is pink. Thyroid not enlarged. No cervical lymphadenopathy. Lungs clear. Heart regular rate and rhythm.  Abdomen is soft. Bowel sounds are positive. No hepatomegaly. No abdominal masses felt. Tenderness rt upper quadrant. No edema to lower extremities. Patient is alert and oriented.     Assessment:    Diarrhea, possible dehydration. Her heart rate is up. She has hx of elevated transaminases when this occurs. I discussed this  case with Dr. Claudie Leach.     Plan:     Patient to go directly to the ED for evaluation.

## 2011-09-01 NOTE — ED Notes (Signed)
Pt states feeling better at this time. Advised of room assignment & will be calling report & moving her up to room asap.

## 2011-09-01 NOTE — ED Notes (Signed)
Pt c/o RUQ abdominal pain, nausea, diarrhea x 3 weeks. Pt has not urinated since this am at 0830. Pt has been unable to drink or eat much. Sent from Dr. Patty Sermons office.

## 2011-09-01 NOTE — Patient Instructions (Signed)
Go directly to the ED for evaluation. She will probably need admission thru the hospitalist.

## 2011-09-01 NOTE — ED Provider Notes (Signed)
History     CSN: [id number] Arrival date & time: 09/01/2011  4:29 PM   First MD Initiated Contact with Patient 09/01/11 1618      Chief Complaint  Patient presents with  . Abdominal Pain    (Consider location/radiation/quality/duration/timing/severity/associated sxs/prior treatment) Patient is a 45 y.o. female presenting with abdominal pain. The history is provided by the patient.  Abdominal Pain The primary symptoms of the illness include abdominal pain, fatigue, nausea and diarrhea. The primary symptoms of the illness do not include fever, shortness of breath, vomiting, dysuria, vaginal discharge or vaginal bleeding. The current episode started 2 days ago. The onset of the illness was gradual. The problem has been gradually worsening.  The abdominal pain began 2 days ago. The pain came on gradually. The abdominal pain has been gradually worsening since its onset. The abdominal pain is located in the RUQ. The abdominal pain radiates to the periumbilical region. The severity of the abdominal pain is 10/10. The abdominal pain is relieved by nothing.  The diarrhea began 6 to 7 days ago. The diarrhea is watery. The diarrhea occurs 5 to 10 times per day.  The patient states that she believes she is currently not pregnant. The patient has not had a change in bowel habit. Additional symptoms associated with the illness include anorexia. Symptoms associated with the illness do not include chills, diaphoresis, constipation, urgency, frequency or back pain. Significant associated medical issues include liver disease.    Past Medical History  Diagnosis Date  . Elevated liver enzymes   . Diarrhea     Past Surgical History  Procedure Date  . Total abdominal hysterectomy   . Cholecystectomy   . Back surgery   . Breast lumpectomy     bernign    History reviewed. No pertinent family history.  History  Substance Use Topics  . Smoking status: Never Smoker   . Smokeless tobacco: Not on file    . Alcohol Use: No    OB History    Grav Para Term Preterm Abortions TAB SAB Ect Mult Living                  Review of Systems  Constitutional: Positive for fatigue. Negative for fever, chills and diaphoresis.  Respiratory: Negative for cough, chest tightness and shortness of breath.   Cardiovascular: Negative for chest pain and palpitations.  Gastrointestinal: Positive for nausea, abdominal pain, diarrhea and anorexia. Negative for vomiting and constipation.  Genitourinary: Negative for dysuria, urgency, frequency, vaginal bleeding and vaginal discharge.  Musculoskeletal: Negative for back pain.  Neurological: Positive for dizziness, weakness and light-headedness.    Allergies  Sulfa antibiotics  Home Medications   Current Outpatient Rx  Name Route Sig Dispense Refill  . ALPRAZOLAM 1 MG PO TABS Oral Take 1 mg by mouth 3 (three) times daily as needed.      Marland Kitchen DICYCLOMINE HCL 10 MG PO CAPS Oral Take 10 mg by mouth 4 (four) times daily -  before meals and at bedtime.      Marland Kitchen DIPHENOXYLATE-ATROPINE 2.5-0.025 MG PO TABS Oral Take 1 tablet by mouth 4 (four) times daily as needed.      Marland Kitchen GABAPENTIN 600 MG PO TABS Oral Take 600 mg by mouth 3 (three) times daily.      Marland Kitchen HYDROCODONE-ACETAMINOPHEN 5-500 MG PO CAPS Oral Take 1 capsule by mouth every 6 (six) hours as needed.      Marland Kitchen PAROXETINE HCL 20 MG PO TABS Oral Take 20 mg by  mouth every morning.        BP 123/82  Pulse 117  Temp(Src) 97.5 F (36.4 C) (Oral)  Resp 18  Ht 5\' 6"  (1.676 m)  Wt 150 lb (68.04 kg)  BMI 24.21 kg/m2  SpO2 99%  Physical Exam  Constitutional: She is oriented to person, place, and time. She appears well-developed and well-nourished.       Uncomfortable appearng  HENT:  Mouth/Throat: Mucous membranes are dry.  Cardiovascular: Normal rate, regular rhythm and normal heart sounds.   Abdominal: Soft. Normal appearance. She exhibits no distension and no ascites. There is tenderness in the right upper quadrant  and periumbilical area. There is no rigidity, no rebound and no guarding.  Neurological: She is alert and oriented to person, place, and time.  Skin: Skin is warm, dry and intact.    ED Course  Procedures (including critical care time)  Labs Reviewed  DIFFERENTIAL - Abnormal; Notable for the following:    Neutrophils Relative 95 (*)    Lymphocytes Relative 2 (*)    Lymphs Abs 0.1 (*)    Monocytes Relative 2 (*)    All other components within normal limits  CBC  HEPATIC FUNCTION PANEL  LIPASE, BLOOD  BASIC METABOLIC PANEL  URINALYSIS, ROUTINE W REFLEX MICROSCOPIC   No results found.   No diagnosis found.  Results for orders placed during the hospital encounter of 09/01/11  HEPATIC FUNCTION PANEL      Component Value Range   Total Protein 6.3  6.0 - 8.3 (g/dL)   Albumin 3.8  3.5 - 5.2 (g/dL)   AST 409 (*) 0 - 37 (U/L)   ALT 425 (*) 0 - 35 (U/L)   Alkaline Phosphatase 91  39 - 117 (U/L)   Total Bilirubin 0.7  0.3 - 1.2 (mg/dL)   Bilirubin, Direct 0.2  0.0 - 0.3 (mg/dL)   Indirect Bilirubin 0.5  0.3 - 0.9 (mg/dL)  LIPASE, BLOOD      Component Value Range   Lipase 81 (*) 11 - 59 (U/L)  BASIC METABOLIC PANEL      Component Value Range   Sodium 141  135 - 145 (mEq/L)   Potassium 3.1 (*) 3.5 - 5.1 (mEq/L)   Chloride 105  96 - 112 (mEq/L)   CO2 26  19 - 32 (mEq/L)   Glucose, Bld 130 (*) 70 - 99 (mg/dL)   BUN 8  6 - 23 (mg/dL)   Creatinine, Ser 8.11  0.50 - 1.10 (mg/dL)   Calcium 9.4  8.4 - 91.4 (mg/dL)   GFR calc non Af Amer >90  >90 (mL/min)   GFR calc Af Amer >90  >90 (mL/min)  CBC      Component Value Range   WBC 4.8  4.0 - 10.5 (K/uL)   RBC 4.43  3.87 - 5.11 (MIL/uL)   Hemoglobin 13.8  12.0 - 15.0 (g/dL)   HCT 78.2  95.6 - 21.3 (%)   MCV 94.6  78.0 - 100.0 (fL)   MCH 31.2  26.0 - 34.0 (pg)   MCHC 32.9  30.0 - 36.0 (g/dL)   RDW 08.6  57.8 - 46.9 (%)   Platelets 162  150 - 400 (K/uL)  DIFFERENTIAL      Component Value Range   Neutrophils Relative 95 (*) 43 -  77 (%)   Neutro Abs 4.5  1.7 - 7.7 (K/uL)   Lymphocytes Relative 2 (*) 12 - 46 (%)   Lymphs Abs 0.1 (*) 0.7 - 4.0 (K/uL)  Monocytes Relative 2 (*) 3 - 12 (%)   Monocytes Absolute 0.1  0.1 - 1.0 (K/uL)   Eosinophils Relative 1  0 - 5 (%)   Eosinophils Absolute 0.0  0.0 - 0.7 (K/uL)   Basophils Relative 0  0 - 1 (%)   Basophils Absolute 0.0  0.0 - 0.1 (K/uL)   No results found.   IV fluids started, labs pending. Pt appears dehydrated, however, comfortable at the moment. Pain medications and anti emetics ordrered.  Spoke with hospitalist, will admit for rehydration and further evaluation. Pt feeling slightly improved, pain managemble. Refuses pain medications at the moment. Will admit to regular bed. US abdomen requested by the hospitalist, but unable to get it due to late int he day. Will admit.  6:16 PM   MDM          Lottie Mussel, PA 09/01/11 1817  I have discussed this patient with a mid-level provider. The patient will be admitted for rehydration and additional evaluation and management.  Gerhard Munch, MD 09/02/11 2013

## 2011-09-01 NOTE — H&P (Addendum)
PCP:   No primary provider on file.   Chief Complaint: Abdominal pain, diarrhea, and elevation of liver function tests   HPI: Shelia Snyder is an 45 y.o. female with history of elevated liver function tests of unclear etiology [facility]  Recommend[facility] of Dr Renae Fickle of GI  for abdominal pain. Her history dates to back to several episodes of abdominal pain.  She did have elevation of her liver function tests before in the thousands.  She had a couple of ERCPs, along with EGD and reportedly colonoscopy as well.  She has a cholecystectomy also. This time around she has diarrhea for about 3-4 days with lower t lower upper quadrant pain,  nausea and vomiting. She appears to be dehydrated, and she has elevation of her liver function tests to 700s.  She denies any black stool bloody stool, chest pain,  shortness of breath,  fever or chills. Past Medical History  Diagnosis Date  . Elevated liver enzymes   . Diarrhea     Past Surgical History  Procedure Date  . Total abdominal hysterectomy   . Cholecystectomy   . Back surgery   . Breast lumpectomy     bernign    Medications:  HOME MEDS: Prior to Admission medications   Medication Sig Start Date End Date Taking? Authorizing Provider  ALPRAZolam Prudy Feeler) 1 MG tablet Take 1 mg by mouth at bedtime as needed. For anxiety   Yes Historical Provider, MD  gabapentin (NEURONTIN) 600 MG tablet Take 600 mg by mouth 3 (three) times daily.     Yes Historical Provider, MD  hydrocodone-acetaminophen (LORCET-HD) 5-500 MG per capsule Take 1 capsule by mouth every 6 (six) hours as needed. For pain   Yes Historical Provider, MD  PARoxetine (PAXIL) 20 MG tablet Take 20 mg by mouth every morning.     Yes Historical Provider, MD  dicyclomine (BENTYL) 10 MG capsule Take 10 mg by mouth 4 (four) times daily -  before meals and at bedtime.      Historical Provider, MD  diphenoxylate-atropine (LOMOTIL) 2.5-0.025 MG per tablet Take 1 tablet by  mouth 4 (four) times daily as needed.     Historical Provider, MD    PRIOR TO [facility] Medications Prior to Admission  Medication Dose Route Frequency Provider Last Rate Last Dose  . 0.9 %  sodium chloride infusion  999 mL Intravenous Continuous Tatyana A Kirichenko, PA      . dextrose 5 % and 0.9 % NaCl with KCl 40 mEq/L infusion   Intravenous Continuous [place] Siren      . diphenhydrAMINE (BENADRYL) injection 25 mg  25 mg Intravenous Once Gerhard Munch, MD   25 mg at 09/01/11 1857  . enoxaparin (LOVENOX) injection 40 mg  40 mg Subcutaneous Q24H Houston Siren      . HYDROmorphone (DILAUDID) injection 0.5 mg  0.5 mg Intravenous Once Tatyana A Kirichenko, PA   0.5 mg at 09/01/11 1657  . HYDROmorphone (DILAUDID) injection 1 mg  1 mg Intravenous Once Tatyana A Kirichenko, PA   1 mg at 09/01/11 1828  . HYDROmorphone (DILAUDID) injection 1-2 mg  1-2 mg Intravenous Q3H PRN [place] Siren      . ondansetron (ZOFRAN) injection 4 mg  4 mg Intravenous Once Tatyana A Kirichenko, PA   4 mg at 09/01/11 1655  . ondansetron (ZOFRAN) tablet 4 mg  4 mg Oral Q6H PRN Houston Siren       Or  . ondansetron (  ZOFRAN) injection 4 mg  4 mg Intravenous Q6H PRN Houston Siren      . PARoxetine (PAXIL) tablet 20 mg  20 mg Oral Daily Houston Siren      . sodium chloride 0.9 % bolus 1,000 mL  1,000 mL Intravenous Once Tatyana A Kirichenko, PA   1,000 mL at 09/01/11 1658  . DISCONTD: HYDROmorphone (DILAUDID) injection 1 mg  1 mg Intravenous Q4H PRN Tatyana A Kirichenko, PA      . DISCONTD: ondansetron (ZOFRAN) injection 4 mg  4 mg Intravenous Q8H PRN Tatyana A Kirichenko, PA       No current outpatient prescriptions on file as of 09/01/2011.    Allergies:  Allergies  Allergen Reactions  . Sulfa Antibiotics     Social History:   reports that she has never smoked. She does not have any smokeless tobacco history on file. She reports that she drinks about 1.2 ounces of alcohol per week. She reports that she does not use illicit  drugs.  Family History: History reviewed. No pertinent family history.  Rewiew of Systems:  The patient denies anorexia, fever, weight loss,, vision loss, decreased hearing, hoarseness, chest pain, syncope, dyspnea on exertion, peripheral edema, balance deficits, hemoptysis,  melena, hematochezia, severe indigestion/heartburn, hematuria, incontinence, genital sores, muscle weakness, suspicious skin lesions, transient blindness, difficulty walking, depression, unusual weight change, abnormal bleeding, enlarged lymph nodes, angioedema, and breast masses.  Physical Exam: Filed Vitals:   09/01/11 1624 09/01/11 1821 09/01/11 2002  BP: 123/82 108/72 90/62  Pulse: 117 102 96  Temp: 97.5 F (36.4 C)  97.6 F (36.4 C)  TempSrc: Oral    Resp: 18 20 16   Height: 5\' 6"  (1.676 m)  5\' 6"  (1.676 m)  Weight: 68.04 kg (150 lb)  69.9 kg (154 lb 1.6 oz)  SpO2: 99% 95% 91%   Blood pressure 90/62, pulse 96, temperature 97.6 F (36.4 C), temperature source Oral, resp. rate 16, height 5\' 6"  (1.676 m), weight 69.9 kg (154 lb 1.6 oz), SpO2 91.00%.  GEN: Pleasant person lying in bed in no acute distress; cooperative with exam PSYCH: He is alert and oriented x4; does not appear anxious does not appear depressed; affect is normal HEENT: Mucous membranes pink and anicteric; PERRLA; EOM intact; no cervical lymphadenopathy nor thyromegaly or carotid bruit; no JVD; Breasts:: Not examined CHEST WALL: No tenderness CHEST: Normal respiration, clear to auscultation bilaterally HEART: Regular rate and rhythm; no murmurs rubs or gallops BACK: No kyphosis or scoliosis; no CVA tenderness ABDOMEN: Obese, tender in the epigastric area and RUQ ; no masses, no organomegaly, normal abdominal bowel sounds; no pannus; no intertriginous candida. Rectal Exam: Not done EXTREMITIES: No bone or joint deformity; age-appropriate arthropathy of the hands and knees; no edema; no ulcerations. Genitalia: not examined PULSES: 2+ and  symmetric SKIN: Normal hydration no rash or ulceration CNS: Cranial nerves 2-12 grossly intact no focal neurologic deficit   Labs & Imaging Results for orders placed during the hospital encounter of 09/01/11 (from the past 48 hour(s))  HEPATIC FUNCTION PANEL     Status: Abnormal   Collection Time   09/01/11  4:42 PM      Component Value Range Comment   Total Protein 6.3  6.0 - 8.3 (g/dL)    Albumin 3.8  3.5 - 5.2 (g/dL)    AST 782 (*) 0 - 37 (U/L)    ALT 425 (*) 0 - 35 (U/L)    Alkaline Phosphatase 91  39 - 117 (U/L)  Total Bilirubin 0.7  0.3 - 1.2 (mg/dL)    Bilirubin, Direct 0.2  0.0 - 0.3 (mg/dL)    Indirect Bilirubin 0.5  0.3 - 0.9 (mg/dL)   LIPASE, BLOOD     Status: Abnormal   Collection Time   09/01/11  4:42 PM      Component Value Range Comment   Lipase 81 (*) 11 - 59 (U/L)   BASIC METABOLIC PANEL     Status: Abnormal   Collection Time   09/01/11  4:42 PM      Component Value Range Comment   Sodium 141  135 - 145 (mEq/L)    Potassium 3.1 (*) 3.5 - 5.1 (mEq/L)    Chloride 105  96 - 112 (mEq/L)    CO2 26  19 - 32 (mEq/L)    Glucose, Bld 130 (*) 70 - 99 (mg/dL)    BUN 8  6 - 23 (mg/dL)    Creatinine, Ser 1.61  0.50 - 1.10 (mg/dL)    Calcium 9.4  8.4 - 10.5 (mg/dL)    GFR calc non Af Amer >90  >90 (mL/min)    GFR calc Af Amer >90  >90 (mL/min)   CBC     Status: Normal   Collection Time   09/01/11  4:42 PM      Component Value Range Comment   WBC 4.8  4.0 - 10.5 (K/uL)    RBC 4.43  3.87 - 5.11 (MIL/uL)    Hemoglobin 13.8  12.0 - 15.0 (g/dL)    HCT 09.6  04.5 - 40.9 (%)    MCV 94.6  78.0 - 100.0 (fL)    MCH 31.2  26.0 - 34.0 (pg)    MCHC 32.9  30.0 - 36.0 (g/dL)    RDW 81.1  91.4 - 78.2 (%)    Platelets 162  150 - 400 (K/uL)   DIFFERENTIAL     Status: Abnormal   Collection Time   09/01/11  4:42 PM      Component Value Range Comment   Neutrophils Relative 95 (*) 43 - 77 (%)    Neutro Abs 4.5  1.7 - 7.7 (K/uL)    Lymphocytes Relative 2 (*) 12 - 46 (%)     Lymphs Abs 0.1 (*) 0.7 - 4.0 (K/uL)    Monocytes Relative 2 (*) 3 - 12 (%)    Monocytes Absolute 0.1  0.1 - 1.0 (K/uL)    Eosinophils Relative 1  0 - 5 (%)    Eosinophils Absolute 0.0  0.0 - 0.7 (K/uL)    Basophils Relative 0  0 - 1 (%)    Basophils Absolute 0.0  0.0 - 0.1 (K/uL)   URINALYSIS, ROUTINE W REFLEX MICROSCOPIC     Status: Abnormal   Collection Time   09/01/11  6:25 PM      Component Value Range Comment   Color, Urine AMBER (*) YELLOW  BIOCHEMICALS MAY BE AFFECTED BY COLOR   Appearance CLEAR  CLEAR     Specific Gravity, Urine 1.025  1.005 - 1.030     pH 5.5  5.0 - 8.0     Glucose, UA NEGATIVE  NEGATIVE (mg/dL)    Hgb urine dipstick TRACE (*) NEGATIVE     Bilirubin Urine NEGATIVE  NEGATIVE     Ketones, ur NEGATIVE  NEGATIVE (mg/dL)    Protein, ur TRACE (*) NEGATIVE (mg/dL)    Urobilinogen, UA 0.2  0.0 - 1.0 (mg/dL)    Nitrite NEGATIVE  NEGATIVE  Leukocytes, UA NEGATIVE  NEGATIVE    URINE MICROSCOPIC-ADD ON     Status: Abnormal   Collection Time   09/01/11  6:25 PM      Component Value Range Comment   Squamous Epithelial / LPF FEW (*) RARE     WBC, UA 0-2  <3 (WBC/hpf)    RBC / HPF 0-2  <3 (RBC/hpf)    Bacteria, UA FEW (*) RARE     No results found.    Assessment Present on Admission:  .Diarrhea Abdominal pain Elevated liver function tests Dehydration  PLAN: #1 diarrhea:   unclear as to etiology but we'll make her n.p.o. and give her intravenous fluids. We'll send stool for Giardia and O&P C&S.  Though less likely will send her stool for C. difficile also.  #2 abdominal pain: GI has already known of her admission and will see her in consultation. I wonder whether or not she has sphincter of Oddi dysfunction.  Will give her Dilaudid for her pain at this time.  #3 elevation of liver function tests: We'll defer further workup to GI.  #4 dehydration she also has low potassium and we will give her potassium supplement and her IV fluid as well.    Other  plans as per orders.   Ginia Rudell 09/01/2011, 9:47 PM    She also has a murmur, and I will get an ECHO of her heart.

## 2011-09-01 NOTE — ED Notes (Signed)
Patient c/o itching. Patient states "I usually itch when I have pain medication." EDP made aware, order given.

## 2011-09-02 ENCOUNTER — Inpatient Hospital Stay (HOSPITAL_COMMUNITY): Payer: 59

## 2011-09-02 DIAGNOSIS — R011 Cardiac murmur, unspecified: Secondary | ICD-10-CM

## 2011-09-02 DIAGNOSIS — F419 Anxiety disorder, unspecified: Secondary | ICD-10-CM | POA: Diagnosis present

## 2011-09-02 LAB — BASIC METABOLIC PANEL
BUN: 13 mg/dL (ref 6–23)
Chloride: 104 mEq/L (ref 96–112)
GFR calc Af Amer: >90 mL/min (ref >90)
Potassium: 4 mEq/L (ref 3.5–5.1)
Sodium: 140 mEq/L (ref 135–145)

## 2011-09-02 LAB — HEPATIC FUNCTION PANEL
AST: 1107 U/L — ABNORMAL HIGH (ref 0–37)
Albumin: 3.3 g/dL — ABNORMAL LOW (ref 3.5–5.2)
Total Bilirubin: 2 mg/dL — ABNORMAL HIGH (ref 0.3–1.2)
Total Protein: 5.6 g/dL — ABNORMAL LOW (ref 6.0–8.3)

## 2011-09-02 LAB — CBC
HCT: 40.4 % (ref 36.0–46.0)
Hemoglobin: 13.4 g/dL (ref 12.0–15.0)
MCHC: 33.2 g/dL (ref 30.0–36.0)
RBC: 4.22 MIL/uL (ref 3.87–5.11)

## 2011-09-02 LAB — AMYLASE: Amylase: 61 U/L (ref 0–105)

## 2011-09-02 MED ORDER — DIPHENHYDRAMINE HCL 25 MG PO CAPS
50.0000 mg | ORAL_CAPSULE | Freq: Four times a day (QID) | ORAL | Status: DC | PRN
  Administered 2011-09-02 – 2011-09-03 (×2): 50 mg via ORAL
  Filled 2011-09-02 (×3): qty 2

## 2011-09-02 MED ORDER — SODIUM CHLORIDE 0.9 % IV SOLN
INTRAVENOUS | Status: DC
  Administered 2011-09-02: 17:00:00 via INTRAVENOUS
  Administered 2011-09-03: 1000 mL via INTRAVENOUS

## 2011-09-02 MED ORDER — MAGNESIUM SULFATE 40 MG/ML IJ SOLN
2.0000 g | Freq: Once | INTRAMUSCULAR | Status: AC
  Administered 2011-09-02: 2 g via INTRAVENOUS
  Filled 2011-09-02: qty 50

## 2011-09-02 MED ORDER — KCL IN DEXTROSE-NACL 40-5-0.45 MEQ/L-%-% IV SOLN
INTRAVENOUS | Status: AC
  Filled 2011-09-02: qty 1000

## 2011-09-02 MED ORDER — GADOBENATE DIMEGLUMINE 529 MG/ML IV SOLN
14.0000 mL | Freq: Once | INTRAVENOUS | Status: AC | PRN
  Administered 2011-09-02: 14 mL via INTRAVENOUS

## 2011-09-02 MED ORDER — LORAZEPAM 2 MG/ML IJ SOLN
1.0000 mg | Freq: Four times a day (QID) | INTRAMUSCULAR | Status: DC
  Administered 2011-09-02 – 2011-09-03 (×3): 1 mg via INTRAVENOUS
  Filled 2011-09-02 (×3): qty 1

## 2011-09-02 MED ORDER — LORAZEPAM 2 MG/ML IJ SOLN
1.0000 mg | Freq: Once | INTRAMUSCULAR | Status: AC
  Administered 2011-09-02: 1 mg via INTRAVENOUS
  Filled 2011-09-02: qty 1

## 2011-09-02 NOTE — Progress Notes (Signed)
Chart reviewed.  Subjective: Feels no better. Complains of headache/frontal sinus pressure. Nausea. Right upper quadrant pain. Has not had any diarrhea since being here. Feels anxious. Her husband is wondering whether she may be withdrawing from Xanax. She has been unable to take her Neurontin, Paxil, or Xanax for several days due to the nausea.  Objective: Vital signs in last 24 hours: Filed Vitals:   09/01/11 1624 09/01/11 1821 09/01/11 2002 09/02/11 0537  BP: 123/82 108/72 90/62 95/63   Pulse: 117 102 96 103  Temp: 97.5 F (36.4 C)  97.6 F (36.4 C) 98.4 F (36.9 C)  TempSrc: Oral  Oral   Resp: 18 20 16 16   Height: 5\' 6"  (1.676 m)  5\' 6"  (1.676 m)   Weight: 68.04 kg (150 lb)  69.9 kg (154 lb 1.6 oz)   SpO2: 99% 95% 91% 90%   Weight change:   Intake/Output Summary (Last 24 hours) at 09/02/11 1541 Last data filed at 09/02/11 0857  Gross per 24 hour  Intake      0 ml  Output      0 ml  Net      0 ml   General: Uncomfortable appearing. Groggy. HEENT: No sinus tenderness. Dry mucous membranes. Lungs clear to auscultation bilaterally without wheeze rhonchi or rales Abdomen soft, nondistended, normal bowel sounds, right upper quadrant is tender Extremities no clubbing cyanosis or edema  Lab Results: Basic Metabolic Panel:  Lab 09/02/11 1610 09/01/11 2114 09/01/11 1642  NA 140 -- 141  K 4.0 -- 3.1*  CL 104 -- 105  CO2 31 -- 26  GLUCOSE 117* -- 130*  BUN 13 -- 8  CREATININE 0.68 0.63 --  CALCIUM 8.4 -- 9.4  MG -- 1.4* --  PHOS -- 2.6 --   Liver Function Tests:  Lab 09/01/11 1642  AST 750*  ALT 425*  ALKPHOS 91  BILITOT 0.7  PROT 6.3  ALBUMIN 3.8    Lab 09/02/11 1208 09/01/11 1642  LIPASE -- 81*  AMYLASE 61 --   No results found for this basename: AMMONIA:2 in the last 168 hours CBC:  Lab 09/02/11 0547 09/01/11 1642  WBC 8.7 4.8  NEUTROABS -- 4.5  HGB 13.4 13.8  HCT 40.4 41.9  MCV 95.7 94.6  PLT 148* 162   Cardiac Enzymes: No results found for  this basename: CKTOTAL:3,CKMB:3,CKMBINDEX:3,TROPONINI:3 in the last 168 hours BNP: No results found for this basename: POCBNP:3 in the last 168 hours D-Dimer: No results found for this basename: DDIMER:2 in the last 168 hours CBG: No results found for this basename: GLUCAP:6 in the last 168 hours Hemoglobin A1C: No results found for this basename: HGBA1C in the last 168 hours Fasting Lipid Panel: No results found for this basename: CHOL,HDL,LDLCALC,TRIG,CHOLHDL,LDLDIRECT in the last 960 hours Thyroid Function Tests: No results found for this basename: TSH,T4TOTAL,FREET4,T3FREE,THYROIDAB in the last 168 hours Anemia Panel: No results found for this basename: VITAMINB12,FOLATE,FERRITIN,TIBC,IRON,RETICCTPCT in the last 168 hours   Micro Results: No results found for this or any previous visit (from the past 240 hour(s)). Studies/Results: US Abdomen Complete  09/02/2011  *RADIOLOGY REPORT*  Clinical Data: Elevated LFTs.  Abdominal pain.  ABDOMEN ULTRASOUND  Technique:  Complete abdominal ultrasound examination was performed including evaluation of the liver, gallbladder, bile ducts, pancreas, kidneys, spleen, IVC, and abdominal aorta.  Comparison: Abdominal MRI from 11/19/2010.  Abdominal ultrasound from 11/18/2010.  Findings:  Gallbladder:  Surgically absent  Common Bile Duct:  9 mm in diameter.  This is stable comparing back  to the previous ultrasound exam.  Liver:  Normal.  No focal parenchymal abnormality.  No biliary dilation.  IVC:  Normal.  Pancreas:  Normal.  Spleen:  Normal.  Right kidney:  11.1 cm in long axis.  Normal.  Left kidney:  11.6 cm in long axis.  Normal.  Abdominal Aorta:  No aneurysm.  IMPRESSION: Stable exam.  Status post cholecystectomy.  There is mild distention of the extrahepatic common duct which is commonly seen after cholecystectomy.  The appearance is stable since 11/18/2010.  Original Report Authenticated By: ERIC A. MANSELL, M.D.   Scheduled Meds:   .  diphenhydrAMINE  25 mg Intravenous Once  . enoxaparin  40 mg Subcutaneous Q24H  . HYDROmorphone  0.5 mg Intravenous Once  .  HYDROmorphone (DILAUDID) injection  1 mg Intravenous Once  . LORazepam  1 mg Intravenous Once  . LORazepam  1 mg Intravenous Q6H  . ondansetron  4 mg Intravenous Once  . PARoxetine  20 mg Oral Daily  . sodium chloride  1,000 mL Intravenous Once   Continuous Infusions:   . sodium chloride    . dextrose 5 % and 0.9 % NaCl with KCl 40 mEq/L 1,000 mL (09/01/11 2200)   PRN Meds:.diphenhydrAMINE, gadobenate dimeglumine, HYDROmorphone, ondansetron (ZOFRAN) IV, ondansetron, DISCONTD:  HYDROmorphone (DILAUDID) injection, DISCONTD: ondansetron (ZOFRAN) IV  Assessment/Plan: Principal Problem:  *Abdominal pain Active Problems:  LFT elevation  Dehydration  Diarrhea  Anxiety  hypomagnesemia Resolved hypokalemia  MRCP is pending. I will repeat her liver function tests and lipase. Replete magnesium intravenously. Try sips of liquids. Rule out viral hepatitis. Will give IV Ativan as needed.  LOS: 1 day   Lourdes Manning L 09/02/2011, 3:41 PM

## 2011-09-02 NOTE — Consult Note (Addendum)
Reason for Consult nausea, diarrhe Referring Physician: Hospitalist Shelia Snyder is an 45 y.o. female.  HPI Shelia Snyder is a 45 yr old white female newly admitted with diarrhea and rt upper quadrant pain. She was seen in our office yesterday for rt upper quadrant pain which started a few hours before coming in.  She had had diarrhea for about 3 weeks. She says she is having 3-6 stools a day. Her stools are loose and watery. Stools are brownish yellow in color.  She tells me she has not had an appetite for a couple of weeks.  She also c/o nausea. Her husband stated that she has been eating tomato soup and crackers. Shelia Snyder has a hx of elevated transaminases.  In January of this year she was admitted to [facility] with nausea, vomiting, diarrhea and elevated transaminasess.  She underwent an [place] which was negative for choledocholithiasis.  She has had 2 ERCP's in the past per Dr. Patty Sermons notes. One was done at [facility] and the second one was by Dr. Karilyn Cota at [facility]. Also in January she underwent an MRCP w/wo contrast which revealed apparent interval decrease in caliber of the common duct without internal focal filling defect identified. This could reflect a recently passes stone. Mild periportal edema persists.   Today, she continues to have rt upper quadrant pain. She is receiving pain meds IV q 8 hrs. She has not had a BM since admission.   Her AST 750 and ALT 425.  She is scheduled for an US abdomen and MRCP this pm.   Past Medical History  Diagnosis Date  . Elevated liver enzymes   . Diarrhea     Past Surgical History  Procedure Date  . Total abdominal hysterectomy   . Cholecystectomy   . Back surgery   . Breast lumpectomy     bernign    History reviewed. No pertinent family history.  Social History:  reports that she has never smoked. She does not have any smokeless tobacco history on file. She reports that she drinks about 1.2 ounces of alcohol per week. She reports that she does not use illicit  drugs.  Allergies:  Allergies  Allergen Reactions  . Sulfa Antibiotics     Medications: I have reviewed the patient's current medications.  Results for orders placed during the hospital encounter of 09/01/11 (from the past 48 hour(s))  HEPATIC FUNCTION PANEL     Status: Abnormal   Collection Time   09/01/11  4:42 PM      Component Value Range Comment   Total Protein 6.3  6.0 - 8.3 (g/dL)    Albumin 3.8  3.5 - 5.2 (g/dL)    AST 409 (*) 0 - 37 (U/L)    ALT 425 (*) 0 - 35 (U/L)    Alkaline Phosphatase 91  39 - 117 (U/L)    Total Bilirubin 0.7  0.3 - 1.2 (mg/dL)    Bilirubin, Direct 0.2  0.0 - 0.3 (mg/dL)    Indirect Bilirubin 0.5  0.3 - 0.9 (mg/dL)   LIPASE, BLOOD     Status: Abnormal   Collection Time   09/01/11  4:42 PM      Component Value Range Comment   Lipase 81 (*) 11 - 59 (U/L)   BASIC METABOLIC PANEL     Status: Abnormal   Collection Time   09/01/11  4:42 PM      Component Value Range Comment   Sodium 141  135 - 145 (mEq/L)    Potassium  3.1 (*) 3.5 - 5.1 (mEq/L)    Chloride 105  96 - 112 (mEq/L)    CO2 26  19 - 32 (mEq/L)    Glucose, Bld 130 (*) 70 - 99 (mg/dL)    BUN 8  6 - 23 (mg/dL)    Creatinine, Ser 9.81  0.50 - 1.10 (mg/dL)    Calcium 9.4  8.4 - 10.5 (mg/dL)    GFR calc non Af Amer >90  >90 (mL/min)    GFR calc Af Amer >90  >90 (mL/min)   CBC     Status: Normal   Collection Time   09/01/11  4:42 PM      Component Value Range Comment   WBC 4.8  4.0 - 10.5 (K/uL)    RBC 4.43  3.87 - 5.11 (MIL/uL)    Hemoglobin 13.8  12.0 - 15.0 (g/dL)    HCT 19.1  47.8 - 29.5 (%)    MCV 94.6  78.0 - 100.0 (fL)    MCH 31.2  26.0 - 34.0 (pg)    MCHC 32.9  30.0 - 36.0 (g/dL)    RDW 62.1  30.8 - 65.7 (%)    Platelets 162  150 - 400 (K/uL)   DIFFERENTIAL     Status: Abnormal   Collection Time   09/01/11  4:42 PM      Component Value Range Comment   Neutrophils Relative 95 (*) 43 - 77 (%)    Neutro Abs 4.5  1.7 - 7.7 (K/uL)    Lymphocytes Relative 2 (*) 12 - 46 (%)     Lymphs Abs 0.1 (*) 0.7 - 4.0 (K/uL)    Monocytes Relative 2 (*) 3 - 12 (%)    Monocytes Absolute 0.1  0.1 - 1.0 (K/uL)    Eosinophils Relative 1  0 - 5 (%)    Eosinophils Absolute 0.0  0.0 - 0.7 (K/uL)    Basophils Relative 0  0 - 1 (%)    Basophils Absolute 0.0  0.0 - 0.1 (K/uL)   URINALYSIS, ROUTINE W REFLEX MICROSCOPIC     Status: Abnormal   Collection Time   09/01/11  6:25 PM      Component Value Range Comment   Color, Urine AMBER (*) YELLOW  BIOCHEMICALS MAY BE AFFECTED BY COLOR   Appearance CLEAR  CLEAR     Specific Gravity, Urine 1.025  1.005 - 1.030     pH 5.5  5.0 - 8.0     Glucose, UA NEGATIVE  NEGATIVE (mg/dL)    Hgb urine dipstick TRACE (*) NEGATIVE     Bilirubin Urine NEGATIVE  NEGATIVE     Ketones, ur NEGATIVE  NEGATIVE (mg/dL)    Protein, ur TRACE (*) NEGATIVE (mg/dL)    Urobilinogen, UA 0.2  0.0 - 1.0 (mg/dL)    Nitrite NEGATIVE  NEGATIVE     Leukocytes, UA NEGATIVE  NEGATIVE    URINE MICROSCOPIC-ADD ON     Status: Abnormal   Collection Time   09/01/11  6:25 PM      Component Value Range Comment   Squamous Epithelial / LPF FEW (*) RARE     WBC, UA 0-2  <3 (WBC/hpf)    RBC / HPF 0-2  <3 (RBC/hpf)    Bacteria, UA FEW (*) RARE    CREATININE, SERUM     Status: Normal   Collection Time   09/01/11  9:14 PM      Component Value Range Comment   Creatinine, Ser 0.63  0.50 -  1.10 (mg/dL)    GFR calc non Af Amer >90  >90 (mL/min)    GFR calc Af Amer >90  >90 (mL/min)   MAGNESIUM     Status: Abnormal   Collection Time   09/01/11  9:14 PM      Component Value Range Comment   Magnesium 1.4 (*) 1.5 - 2.5 (mg/dL)   PHOSPHORUS     Status: Normal   Collection Time   09/01/11  9:14 PM      Component Value Range Comment   Phosphorus 2.6  2.3 - 4.6 (mg/dL)   BASIC METABOLIC PANEL     Status: Abnormal   Collection Time   09/02/11  5:47 AM      Component Value Range Comment   Sodium 140  135 - 145 (mEq/L)    Potassium 4.0  3.5 - 5.1 (mEq/L) DELTA CHECK NOTED   Chloride  104  96 - 112 (mEq/L)    CO2 31  19 - 32 (mEq/L)    Glucose, Bld 117 (*) 70 - 99 (mg/dL)    BUN 13  6 - 23 (mg/dL)    Creatinine, Ser 9.60  0.50 - 1.10 (mg/dL)    Calcium 8.4  8.4 - 10.5 (mg/dL)    GFR calc non Af Amer >90  >90 (mL/min)    GFR calc Af Amer >90  >90 (mL/min)   CBC     Status: Abnormal   Collection Time   09/02/11  5:47 AM      Component Value Range Comment   WBC 8.7  4.0 - 10.5 (K/uL)    RBC 4.22  3.87 - 5.11 (MIL/uL)    Hemoglobin 13.4  12.0 - 15.0 (g/dL)    HCT 45.4  09.8 - 11.9 (%)    MCV 95.7  78.0 - 100.0 (fL)    MCH 31.8  26.0 - 34.0 (pg)    MCHC 33.2  30.0 - 36.0 (g/dL)    RDW 14.7  82.9 - 56.2 (%)    Platelets 148 (*) 150 - 400 (K/uL)     No results found.  ROS see hpi  Blood pressure 95/63, pulse 103, temperature 98.4 F (36.9 C), temperature source Oral, resp. rate 16, height 5\' 6"  (1.676 m), weight 154 lb 1.6 oz (69.9 kg), SpO2 90.00%. Physical ExamAlert and oriented.  Easily arousable.  Skin warm and dry. Oral mucosa is moist. Natural teeth in good condition. Sclera anicteric, conjunctivae is pink. Thyroid not enlarged. No cervical lymphadenopathy. Lungs clear. Heart regular rate and rhythm.  Abdomen is soft. Bowel sounds are positive. No hepatomegaly. No abdominal masses felt.. Tenderness rt upper and lower quadrant at the umblicus.  No edema to lower extremities. Patient is alert and oriented.   Assessment/Plan: Nausea, diarrhea, elevated transaminases.  US abdomen and MRCP are pending for today.  Possible stone in CBD.  Will discuss with Dr. Karilyn Cota.  She may possible need an ERCP with Spincterotomy.   SETZER,TERRI W 09/02/2011, 11:54 AM     Patient interviewed and examined;details in Ms. Setzer,s note. Patient known to me from evaluation in 2011 when she had her 2nd ERCP with sphincterotomy. First ERCP was 7 years ago at [facility]. More recently she was admitted past January of acute illness with hypotension fever or  diarrhea as well as elevated transaminases. She was felt to have sepsis. There was also question of recurrent choledocholithiasis. She had ultrasound which revealed CBD of 10 mm followed by MRCP which was negative for  choledocholithiasis. It was felt that she could have had ischemic liver injury or may have passed a small stone. She was seen in the office in her LFTs on 12/05/2010 are within normal limits. Now she comes in with three-week history of nonbloody diarrhea, 2 day history of right-sided abdominal pain; when seen in the office yesterday she was complaining of right upper quadrant pain this evening she complains of right lower quadrant abdominal pain. She says she was doing quite well up until her symptoms started 3 weeks ago. No history of travel or recent antibiotic use. She was hypotensive yesterday blood pressure has been normal today. Her transaminases have gone up. Dr. Lendell Caprice has requested the water markers for acute hepatitis. Serum lipase was elevated She had ultrasound yesterday which revealed CBD diameter of 9 mm essentially unchanged from January 2012 study. She had MRCP today which is negative for choledocholithiasis. This study does suggest mild changes of pancreatitis. MRCP reveals heterogeneous appearance to liver Assessment Patient's acute illness cannot be explained on the basis of biliary colic or biliary pancreatitis. Her diarrhea preceded abdominal pain for several days. Given her presentation I would be more concerned ischemic hepatic injury and heterogeneous appearance on MRCP may favor this possibility. Since she does not have choledocholithiasis and her illness does not suggest cholangitis there is no indication or ERCP at the present time. Our approach may change depending on her clinical course. Recommendations Stool studies for fecal lactoferrin, bacterial cultures, C. difficile by PCR and O&P. Repeat LFTs in a.m. Patient will be reevaluated on 09/03/2011. Patient,s  condition was reviewed with Dr. Lendell Caprice earlier today.

## 2011-09-02 NOTE — Progress Notes (Signed)
*  PRELIMINARY RESULTS* Echocardiogram has been performed.  Conrad Sibley 09/02/2011, 10:34 AM

## 2011-09-03 LAB — COMPREHENSIVE METABOLIC PANEL
ALT: 571 U/L — ABNORMAL HIGH (ref 0–35)
Alkaline Phosphatase: 137 U/L — ABNORMAL HIGH (ref 39–117)
BUN: 13 mg/dL (ref 6–23)
CO2: 27 mEq/L (ref 19–32)
Chloride: 105 mEq/L (ref 96–112)
GFR calc Af Amer: >90 mL/min (ref >90)
GFR calc non Af Amer: >90 mL/min (ref >90)
Glucose, Bld: 84 mg/dL (ref 70–99)
Potassium: 4 mEq/L (ref 3.5–5.1)
Sodium: 138 mEq/L (ref 135–145)
Total Bilirubin: 2.1 mg/dL — ABNORMAL HIGH (ref 0.3–1.2)

## 2011-09-03 LAB — LIPASE, BLOOD: Lipase: 17 U/L (ref 11–59)

## 2011-09-03 MED ORDER — SODIUM CHLORIDE 0.9 % IJ SOLN
INTRAMUSCULAR | Status: AC
  Administered 2011-09-03: 10:00:00
  Filled 2011-09-03: qty 10

## 2011-09-03 MED ORDER — ALPRAZOLAM 1 MG PO TABS
1.0000 mg | ORAL_TABLET | Freq: Two times a day (BID) | ORAL | Status: DC | PRN
  Administered 2011-09-04: 1 mg via ORAL
  Filled 2011-09-03: qty 1

## 2011-09-03 MED ORDER — GABAPENTIN 600 MG PO TABS
600.0000 mg | ORAL_TABLET | Freq: Three times a day (TID) | ORAL | Status: DC
  Filled 2011-09-03 (×3): qty 1

## 2011-09-03 MED ORDER — GABAPENTIN 300 MG PO CAPS
600.0000 mg | ORAL_CAPSULE | Freq: Three times a day (TID) | ORAL | Status: DC
  Administered 2011-09-03 – 2011-09-05 (×6): 600 mg via ORAL
  Filled 2011-09-03 (×2): qty 2
  Filled 2011-09-03: qty 1
  Filled 2011-09-03 (×3): qty 2

## 2011-09-03 MED ORDER — DICYCLOMINE HCL 10 MG PO CAPS
10.0000 mg | ORAL_CAPSULE | Freq: Three times a day (TID) | ORAL | Status: DC
  Administered 2011-09-03 – 2011-09-05 (×7): 10 mg via ORAL
  Filled 2011-09-03 (×7): qty 1

## 2011-09-03 NOTE — Progress Notes (Signed)
Subjective; patient feels much better; she has not experienced diarrhea today therefore stool studies have not been done. She complains of mild pain in right upper and right lower quadrant. She states she has diarrhea every day unless she took her Lomotil; while on the Motrin and her pain medications she gets constipated and may go 3 days without a bowel movement. Her appetite is back. She denies recent travel or antibiotic use. Objective BP 131/80  Pulse 73  Temp(Src) 98.1 F (36.7 C) (Oral)  Resp 18  Ht 5\' 6"  (1.676 m)  Wt 163 lb 9.3 oz (74.2 kg)  BMI 26.40 kg/m2  SpO2 97% She appears to be in no acute distress. Her abdomen is symmetrical bowel sounds are normal on palpation soft abdomen with mild tenderness in right lower and upper quadrant. No organomegaly or masses. Lab data Total bilirubin is 2.1, AP 137, AST 322, ALT 571, albumin is 3.0 Lipase 17. Viral markers are pending. Assessment #1. Acute on chronic diarrhea. She hasn't had any bowel movements today. Via a chronic diarrhea may be due to IBS but her acute episodes which appear to be incapacitating cannot be blamed on IBS. She had similar episode back in January when she was hospitalized but she also had fever and other issues. She may need colonoscopy at some point would be nice to document his stool studies are negative. #2. Elevated transaminases. Significant drop in AST and ALT in the last 24 hours. Negative ultrasound and MRCP for choledocholithiasis or dilated biliary system. Last ERCP was in August 2010 and no stone was found in the bile duct it was presumed that she had passed a stone she had same scenario 7 years ago when she was at [facility]. At this point I'm not convinced that she needs ERCP. It is quite possible that episode in January and this one are secondary secondary to dehydration and ischemic injury. Only thing that would be difficult to explain would be pancreatitis. Recommendations Agree  with advancing her diet. Repeat LFTs in a.m. Colonoscopy either during this admission or as an outpatient.

## 2011-09-03 NOTE — Progress Notes (Signed)
Subjective: Feels better today. Headache is gone. Pain is improved. Has had no further diarrhea. No nausea. Tolerating clear liquids. Has not had pain or nausea medication since yesterday.  Objective: Vital signs in last 24 hours: Filed Vitals:   09/02/11 1553 09/02/11 2159 09/03/11 0602 09/03/11 0654  BP: 116/77 111/69 126/79   Pulse: 88 85 75   Temp: 98.1 F (36.7 C) 98.4 F (36.9 C) 99 F (37.2 C)   TempSrc: Oral Oral Oral   Resp: 20 18 18    Height:      Weight:    74.2 kg (163 lb 9.3 oz)  SpO2: 96% 94% 94%    Weight change: 6.16 kg (13 lb 9.3 oz)  Intake/Output Summary (Last 24 hours) at 09/03/11 1408 Last data filed at 09/03/11 0900  Gross per 24 hour  Intake    610 ml  Output      0 ml  Net    610 ml   General: Asleep. Arousable. More comfortable. HEENT: Moist mucous membranes. Lungs clear to auscultation bilaterally without wheeze rhonchi or rales Abdomen soft, nondistended, normal bowel sounds, right upper quadrant is tender Extremities no clubbing cyanosis or edema  Lab Results: Basic Metabolic Panel:  Lab 09/03/11 1610 09/02/11 0547 09/01/11 2114  NA 138 140 --  K 4.0 4.0 --  CL 105 104 --  CO2 27 31 --  GLUCOSE 84 117* --  BUN 13 13 --  CREATININE 0.61 0.68 --  CALCIUM 8.5 8.4 --  MG -- -- 1.4*  PHOS -- -- 2.6   Liver Function Tests:  Lab 09/03/11 0528 09/02/11 0540  AST 322* 1107*  ALT 571* 995*  ALKPHOS 137* 124*  BILITOT 2.1* 2.0*  PROT 5.6* 5.6*  ALBUMIN 3.0* 3.3*    Lab 09/03/11 0528 09/02/11 1208 09/01/11 1642  LIPASE 17 -- 81*  AMYLASE -- 61 --   No results found for this basename: AMMONIA:2 in the last 168 hours CBC:  Lab 09/02/11 0547 09/01/11 1642  WBC 8.7 4.8  NEUTROABS -- 4.5  HGB 13.4 13.8  HCT 40.4 41.9  MCV 95.7 94.6  PLT 148* 162   Cardiac Enzymes: No results found for this basename: CKTOTAL:3,CKMB:3,CKMBINDEX:3,TROPONINI:3 in the last 168 hours BNP: No results found for this basename: POCBNP:3 in the last  168 hours D-Dimer: No results found for this basename: DDIMER:2 in the last 168 hours CBG: No results found for this basename: GLUCAP:6 in the last 168 hours Hemoglobin A1C: No results found for this basename: HGBA1C in the last 168 hours Fasting Lipid Panel: No results found for this basename: CHOL,HDL,LDLCALC,TRIG,CHOLHDL,LDLDIRECT in the last 960 hours Thyroid Function Tests: No results found for this basename: TSH,T4TOTAL,FREET4,T3FREE,THYROIDAB in the last 168 hours Anemia Panel: No results found for this basename: VITAMINB12,FOLATE,FERRITIN,TIBC,IRON,RETICCTPCT in the last 168 hours   Micro Results: No results found for this or any previous visit (from the past 240 hour(s)). Studies/Results: US Abdomen Complete  09/02/2011  *RADIOLOGY REPORT*  Clinical Data: Elevated LFTs.  Abdominal pain.  ABDOMEN ULTRASOUND  Technique:  Complete abdominal ultrasound examination was performed including evaluation of the liver, gallbladder, bile ducts, pancreas, kidneys, spleen, IVC, and abdominal aorta.  Comparison: Abdominal MRI from 11/19/2010.  Abdominal ultrasound from 11/18/2010.  Findings:  Gallbladder:  Surgically absent  Common Bile Duct:  9 mm in diameter.  This is stable comparing back to the previous ultrasound exam.  Liver:  Normal.  No focal parenchymal abnormality.  No biliary dilation.  IVC:  Normal.  Pancreas:  Normal.  Spleen:  Normal.  Right kidney:  11.1 cm in long axis.  Normal.  Left kidney:  11.6 cm in long axis.  Normal.  Abdominal Aorta:  No aneurysm.  IMPRESSION: Stable exam.  Status post cholecystectomy.  There is mild distention of the extrahepatic common duct which is commonly seen after cholecystectomy.  The appearance is stable since 11/18/2010.  Original Report Authenticated By: ERIC A. MANSELL, M.D.   Mr 3d Recon At Scanner  09/02/2011  *RADIOLOGY REPORT*  Clinical Data:  Biliary colic.  Elevated liver function tests. Prior cholecystectomy 11/19/2010.  MRI ABDOMEN  WITHOUT AND WITH CONTRAST (INCLUDING MRCP)  Technique:  Multiplanar multisequence MR imaging of the abdomen was performed both before and after the administration of intravenous contrast. Heavily T2-weighted images of the biliary and pancreatic ducts were obtained, and three-dimensional MRCP images were rendered by post processing.  Contrast: 14mL MULTIHANCE GADOBENATE DIMEGLUMINE 529 MG/ML IV SOLN  Comparison:  None  Findings:  Likely physiologic trace bilateral pleural fluid. Normal heart size.  Hepatomegaly, 19.3 cm cranial caudal.  No steatosis.  No intrahepatic biliary ductal dilatation.  Arterial phase images demonstrate heterogeneous enhancement throughout the liver .  Portal venous and delayed enhancement is symmetric.  Normal spleen, stomach, adrenal glands, kidneys.  Mild edema surrounds the pancreatic head and uncinate process as well as the adjacent descending.  Example image 31 of series 5.  Pancreas enhances normally, without pancreatic ductal dilatation. Surrounding vasculature is patent.  Cholecystectomy without biliary ductal dilatation.  The common duct measures maximally 6 mm on image 25 of series 7.  Normal tapering distally.  No post cholecystectomy fluid collection.  No abdominal adenopathy or ascites.  IMPRESSION:  1.  No biliary ductal dilatation or common duct stone. 2.  Edema adjacent the pancreatic head and adjacent second portion of the duodenum.  Given elevated and amylase level yesterday, likely indicative of uncomplicated pancreatitis.  A component of duodenitis cannot be excluded. 3.  Heterogeneous arterial enhancement within the liver.  This is nonspecific.  Given the elevated liver function tests, acute or subacute hepatitis would be a consideration.  Original Report Authenticated By: Consuello Bossier, M.D.   Mr Abd W/wo Cm/mrcp  09/02/2011  *RADIOLOGY REPORT*  Clinical Data:  Biliary colic.  Elevated liver function tests. Prior cholecystectomy 11/19/2010.  MRI ABDOMEN WITHOUT AND  WITH CONTRAST (INCLUDING MRCP)  Technique:  Multiplanar multisequence MR imaging of the abdomen was performed both before and after the administration of intravenous contrast. Heavily T2-weighted images of the biliary and pancreatic ducts were obtained, and three-dimensional MRCP images were rendered by post processing.  Contrast: 14mL MULTIHANCE GADOBENATE DIMEGLUMINE 529 MG/ML IV SOLN  Comparison:  None  Findings:  Likely physiologic trace bilateral pleural fluid. Normal heart size.  Hepatomegaly, 19.3 cm cranial caudal.  No steatosis.  No intrahepatic biliary ductal dilatation.  Arterial phase images demonstrate heterogeneous enhancement throughout the liver .  Portal venous and delayed enhancement is symmetric.  Normal spleen, stomach, adrenal glands, kidneys.  Mild edema surrounds the pancreatic head and uncinate process as well as the adjacent descending.  Example image 31 of series 5.  Pancreas enhances normally, without pancreatic ductal dilatation. Surrounding vasculature is patent.  Cholecystectomy without biliary ductal dilatation.  The common duct measures maximally 6 mm on image 25 of series 7.  Normal tapering distally.  No post cholecystectomy fluid collection.  No abdominal adenopathy or ascites.  IMPRESSION:  1.  No biliary ductal dilatation or common duct stone. 2.  Edema adjacent the pancreatic head and adjacent second portion of the duodenum.  Given elevated and amylase level yesterday, likely indicative of uncomplicated pancreatitis.  A component of duodenitis cannot be excluded. 3.  Heterogeneous arterial enhancement within the liver.  This is nonspecific.  Given the elevated liver function tests, acute or subacute hepatitis would be a consideration.  Original Report Authenticated By: Consuello Bossier, M.D.   Scheduled Meds:    . dicyclomine  10 mg Oral TID AC & HS  . enoxaparin  40 mg Subcutaneous Q24H  . gabapentin  600 mg Oral TID  . LORazepam  1 mg Intravenous Once  . magnesium  sulfate infusion  2 g Intravenous Once  . PARoxetine  20 mg Oral Daily  . sodium chloride      . DISCONTD: LORazepam  1 mg Intravenous Q6H   Continuous Infusions:    . sodium chloride 1,000 mL (09/03/11 0249)  . DISCONTD: dextrose 5 % and 0.9 % NaCl with KCl 40 mEq/L 1,000 mL (09/01/11 2200)   PRN Meds:.ALPRAZolam, diphenhydrAMINE, gadobenate dimeglumine, HYDROmorphone, ondansetron (ZOFRAN) IV, ondansetron  Assessment/Plan: Principal Problem:  *Abdominal pain Active Problems:  LFT elevation  Dehydration  Diarrhea  Anxiety   The patient's symptoms and liver function tests are resolving. Appreciate GI input. I will advance her diet. Resume home medications. Decrease IV fluids. Repeat liver function tests in the morning.   LOS: 2 days   Amirra Herling L 09/03/2011, 2:08 PM

## 2011-09-04 ENCOUNTER — Encounter (HOSPITAL_COMMUNITY): Payer: Self-pay | Admitting: Internal Medicine

## 2011-09-04 DIAGNOSIS — E876 Hypokalemia: Secondary | ICD-10-CM | POA: Diagnosis present

## 2011-09-04 DIAGNOSIS — K589 Irritable bowel syndrome without diarrhea: Secondary | ICD-10-CM

## 2011-09-04 HISTORY — DX: Irritable bowel syndrome without diarrhea: K58.9

## 2011-09-04 LAB — HEPATIC FUNCTION PANEL
AST: 93 U/L — ABNORMAL HIGH (ref 0–37)
Alkaline Phosphatase: 132 U/L — ABNORMAL HIGH (ref 39–117)
Bilirubin, Direct: 0.4 mg/dL — ABNORMAL HIGH (ref 0.0–0.3)
Total Bilirubin: 0.7 mg/dL (ref 0.3–1.2)

## 2011-09-04 LAB — CLOSTRIDIUM DIFFICILE BY PCR: Toxigenic C. Difficile by PCR: NEGATIVE

## 2011-09-04 LAB — HEPATITIS PANEL, ACUTE
HCV Ab: NEGATIVE
Hep A IgM: NEGATIVE

## 2011-09-04 MED ORDER — PEG 3350-KCL-NABCB-NACL-NASULF 236 G PO SOLR
4000.0000 mL | Freq: Once | ORAL | Status: AC
  Administered 2011-09-04: 4000 mL via ORAL
  Filled 2011-09-04: qty 4000

## 2011-09-04 NOTE — Progress Notes (Signed)
Patient states she feels much better. She had one stool during the night which was unformed and mushy. She has soreness in right side of her abdomen but no sharp pain. She states she has a good appetite and a meal tray is clean. She would like to have a colonoscopy during this admission. Stool studies are still incomplete but I doubt that she has C. difficile colitis. She is afebrile Her abdomen is flat and soft with mild tenderness in right upper quadrant. She is not tender at RLQ this  Morning. Lab data Bilirubin 0.4, AP 132, AST 93, ALT 371, and albumin is 2.9. Assessment 1. acute on chronic diarrhea much improved. She is presumed to have irritable bowel syndrome this diagnosis in the explain her chronic symptoms and not acute or recurrent flareups. Need to rule out collagenous or microscopic colitis 2. elevated transaminases with continued improvement. No evidence of biliary obstruction or choledocholithiasis. This abnormality may well be due to ischemic hepatitis in the setting of dehydration and hypotension. Recommendations Diagnostic colonoscopy to be performed in a.m. She will be prepped with [facility] this afternoon. Discussed with Dr. Sherrie Mustache who is her hospitalist for the weekend.

## 2011-09-04 NOTE — Progress Notes (Signed)
Subjective: She had a soft stool last night. She has less abdominal pain.  Objective: Vital signs in last 24 hours: Filed Vitals:   09/03/11 0654 09/03/11 1446 09/03/11 2221 09/04/11 0500  BP:  131/80 124/80 150/101  Pulse:  73 62 79  Temp:  98.1 F (36.7 C) 98.2 F (36.8 C) 97.6 F (36.4 C)  TempSrc:   Oral Oral  Resp:  18 20 20   Height:      Weight: 74.2 kg (163 lb 9.3 oz)     SpO2:  97% 97% 93%    Intake/Output Summary (Last 24 hours) at 09/04/11 1557 Last data filed at 09/04/11 1500  Gross per 24 hour  Intake   1700 ml  Output   1900 ml  Net   -200 ml    Weight change:   Exam: Lungs: CTA bilaterally. Heart: S1 S2, no M/G/R.\ Abdomen:Positive bowel sounds, nontender, nondistended. Extremities: No pedal edema.  Lab Results: Basic Metabolic Panel:  Basename 09/03/11 0528 09/02/11 0547 09/01/11 2114  NA 138 140 --  K 4.0 4.0 --  CL 105 104 --  CO2 27 31 --  GLUCOSE 84 117* --  BUN 13 13 --  CREATININE 0.61 0.68 --  CALCIUM 8.5 8.4 --  MG -- -- 1.4*  PHOS -- -- 2.6   Liver Function Tests:  [facility] 09/04/11 0429 09/03/11 0528  AST 93* 322*  ALT 371* 571*  ALKPHOS 132* 137*  BILITOT 0.7 2.1*  PROT 5.1* 5.6*  ALBUMIN 2.9* 3.0*    Basename 09/03/11 0528 09/02/11 1208 09/01/11 1642  LIPASE 17 -- 81*  AMYLASE -- 61 --   No results found for this basename: AMMONIA:2 in the last 72 hours CBC:  Basename 09/02/11 0547 09/01/11 1642  WBC 8.7 4.8  NEUTROABS -- 4.5  HGB 13.4 13.8  HCT 40.4 41.9  MCV 95.7 94.6  PLT 148* 162   Cardiac Enzymes: No results found for this basename: CKTOTAL:3,CKMB:3,CKMBINDEX:3,TROPONINI:3 in the last 72 hours BNP: No results found for this basename: POCBNP:3 in the last 72 hours D-Dimer: No results found for this basename: DDIMER:2 in the last 72 hours CBG: No results found for this basename: GLUCAP:6 in the last 72 hours Hemoglobin A1C: No results found for this basename: HGBA1C in the last 72 hours Fasting Lipid  Panel: No results found for this basename: CHOL,HDL,LDLCALC,TRIG,CHOLHDL,LDLDIRECT in the last 72 hours Thyroid Function Tests: No results found for this basename: TSH,T4TOTAL,FREET4,T3FREE,THYROIDAB in the last 72 hours Anemia Panel: No results found for this basename: VITAMINB12,FOLATE,FERRITIN,TIBC,IRON,RETICCTPCT in the last 72 hours   Alcohol Level: No results found for this basename: ETH:2 in the last 72 hours     Micro: No results found for this or any previous visit (from the past 240 hour(s)).  Studies/Results: No results found.  Medications: I have reviewed the patient's current medications.  Assessment: Principal Problem:  *Abdominal pain Active Problems:  Diarrhea  Dehydration  LFT elevation  Anxiety  IBS (irritable bowel syndrome)  Hypokalemia  The patient is symptomatically better. Dr. Karilyn Cota believes the patient's elevated LFTs may be secondary to ischemic hepatitis in the setting of dehydration and hypotension. Her LFTs are coming down. Her viral hepatitis panel is negative. Her MRCP showed no ductal stones, but there was a suggestion of pancreatitis. Her lipase has normalized. Stool studies are pending. Hypokalemia has been corrected. Her blood pressure is up today; this will be monitored.  Plan:  Colonoscopy planned tomorrow.    LOS: 3 days   Chrishawn Boley 09/04/2011,  3:57 PM

## 2011-09-05 ENCOUNTER — Encounter (HOSPITAL_COMMUNITY)
Admission: EM | Disposition: A | Discharge status: Home / Self Care | Payer: Self-pay | Source: Home / Self Care | Attending: Internal Medicine

## 2011-09-05 ENCOUNTER — Other Ambulatory Visit (INDEPENDENT_AMBULATORY_CARE_PROVIDER_SITE_OTHER): Payer: Self-pay | Admitting: Internal Medicine

## 2011-09-05 ENCOUNTER — Encounter (HOSPITAL_COMMUNITY): Payer: Self-pay | Admitting: *Deleted

## 2011-09-05 HISTORY — PX: COLONOSCOPY: SHX5424

## 2011-09-05 SURGERY — COLONOSCOPY
Anesthesia: Moderate Sedation

## 2011-09-05 MED ORDER — ONDANSETRON HCL 4 MG PO TABS: 4.0000 mg | ORAL_TABLET | Freq: Three times a day (TID) | ORAL | Status: AC | PRN

## 2011-09-05 MED ORDER — SODIUM CHLORIDE 0.9 % IJ SOLN
INTRAMUSCULAR | Status: AC
  Filled 2011-09-05: qty 10

## 2011-09-05 MED ORDER — MEPERIDINE HCL 50 MG/ML IJ SOLN
INTRAMUSCULAR | Status: DC | PRN
  Administered 2011-09-05 (×3): 25 mg

## 2011-09-05 MED ORDER — HYDROCODONE-ACETAMINOPHEN 5-500 MG PO CAPS: 1.0000 | ORAL_CAPSULE | Freq: Four times a day (QID) | ORAL | Status: DC | PRN

## 2011-09-05 MED ORDER — MEPERIDINE HCL 50 MG/ML IJ SOLN
INTRAMUSCULAR | Status: AC
  Filled 2011-09-05: qty 1

## 2011-09-05 MED ORDER — MIDAZOLAM HCL 5 MG/5ML IJ SOLN
INTRAMUSCULAR | Status: AC
  Filled 2011-09-05: qty 5

## 2011-09-05 MED ORDER — DICYCLOMINE HCL 10 MG PO CAPS: ORAL_CAPSULE | ORAL | Status: DC

## 2011-09-05 MED ORDER — MIDAZOLAM HCL 5 MG/5ML IJ SOLN
INTRAMUSCULAR | Status: DC | PRN
  Administered 2011-09-05 (×2): 2 mg via INTRAVENOUS
  Administered 2011-09-05: 3 mg via INTRAVENOUS
  Administered 2011-09-05 (×4): 2 mg via INTRAVENOUS

## 2011-09-05 MED ORDER — DIPHENOXYLATE-ATROPINE 2.5-0.025 MG PO TABS: 1.0000 | ORAL_TABLET | Freq: Four times a day (QID) | ORAL | Status: DC | PRN

## 2011-09-05 MED ORDER — PROMETHAZINE HCL 25 MG/ML IJ SOLN
INTRAMUSCULAR | Status: DC | PRN
  Administered 2011-09-05 (×2): 12.5 mg via INTRAVENOUS

## 2011-09-05 MED ORDER — MIDAZOLAM HCL 5 MG/5ML IJ SOLN
INTRAMUSCULAR | Status: AC
  Filled 2011-09-05: qty 10

## 2011-09-05 MED ORDER — PROMETHAZINE HCL 25 MG/ML IJ SOLN
INTRAMUSCULAR | Status: AC
  Filled 2011-09-05: qty 1

## 2011-09-05 NOTE — Op Note (Signed)
COLONOSCOPY PROCEDURE REPORT  PATIENT:  Shelia Snyder  MR#:  [id number] Birthdate:  February 11, 1966, 45 y.o., female Endoscopist:  Dr. Malissa Hippo, MD Referred By:  Dr. Ernestine Conrad, M.D. Procedure Date: 09/05/2011  Procedure:   Colonoscopy  Indications:  Patient is 45 year old Caucasian female with acute on chronic diarrhea. Her stool C. difficile by PCR is negative. She is undergoing diagnostic colonoscopy. Patient has been using Lomotil for over 6 months.  Informed Consent: Procedure and risks were reviewed with the patient and informed consent was obtained. Medications:  Demerol 75 mg IV Versed 15 mg IV Promethazine when he 5 mg IV in diluted form.  Description of procedure:  After a digital rectal exam was performed, that colonoscope was advanced from the anus through the rectum and colon to the area of the cecum, ileocecal valve and appendiceal orifice. The cecum was deeply intubated. These structures were well-seen and photographed for the record. From the level of the cecum and ileocecal valve, the scope was slowly and cautiously withdrawn. The mucosal surfaces were carefully surveyed utilizing scope tip to flexion to facilitate fold flattening as needed. The scope was pulled down into the rectum where a thorough exam including retroflexion was performed. Terminal ileum was also examined at least 15 cm  Findings:  Prep was satisfactory  Normal terminal ileal mucosa. Normal mucosa of the colon and rectum. Normal anorectal junction. Random  biopsies taken from sigmoid colon looking for microscopic or collagenous colitis   Therapeutic/Diagnostic Maneuvers Performed:  See above  Complications:  None  Cecal Withdrawal Time:  12 minutes  Impression:  Normal terminal ileoscopy. Normal colonoscopy. Random biopsies taken from mucosa of sigmoid colon looking for collagenous or microscopic colitis.  Recommendations:  Resume usual medications and diet. She can go home this afternoon  and we'll plan to see her back in 2 weeks at which time she'll have LFTs repeated.  Shelia Snyder U  09/05/2011 8:47 AM  CC: Dr. Ernestine Conrad, MD,.

## 2011-09-05 NOTE — Progress Notes (Signed)
Discharge summary: a/o.vss. Saline lock removed. Up ad lib. No complaints of distress. Discharge instuctions given. Prescriptions given. Pt verbalized understanding of instructions. Left floor via wheelchair with nursing staff and family member.

## 2011-09-06 LAB — FECAL LACTOFERRIN, QUANT

## 2011-09-07 DIAGNOSIS — D696 Thrombocytopenia, unspecified: Secondary | ICD-10-CM | POA: Diagnosis not present

## 2011-09-07 NOTE — Discharge Summary (Signed)
Physician Discharge Summary  Shelia Snyder MRN: [id number] DOB/AGE: [birth year] 45 y.o.  PCP:  GILionel December, MD   Admit date: 09/01/2011 Discharge date: 09/05/2011  Discharge Diagnoses:  1. Abdominal pain and acute on chronic diarrhea. Etiology possibly secondary to acute pancreatitis and irritable bowel syndrome. 2. Elevated liver transaminases. Her AST was 1107 on admission and 93 at the time of discharge. Her ALT was 995 on admission and 371 at the time of discharge. Her direct bilirubin was 1.1 on admission and 0.4 at the time of discharge. Her total bilirubin was 2.0 on admission and 0.7 at the time of discharge. The etiology of her elevated liver transaminases was possibly secondary to a combination of dehydration, transient hypotension, and ischemic injury. The ultrasound of her abdomen and MRCP were negative for choledocholithiasis. 3. History of ERCP in August of 2010 at [facility] which did not reveal any stones in the bile duct, however, it was presumed that there was a past stone. The same scenario occurred 7 years prior at [facility] per evaluation by ERCP then. 4. Colonoscopy per Dr. Karilyn Cota on 09/05/2011. The results revealed a normal exam. Biopsies were taken from the sigmoid colon looking for microscopic or collagenous colitis. 5. Hypomagnesemia, status post IV repletion. 6. Hypokalemia, status post repletion. 7. Mild thrombocytopenia. Her platelet count was 148 on admission. 8. Chronic anxiety.    Discharge Medication List as of 09/05/2011  2:05 PM    START taking these medications   Details  ondansetron (ZOFRAN) 4 MG tablet Take 1 tablet (4 mg total) by mouth every 8 (eight) hours as needed for nausea., Starting 09/05/2011, Until Sat 09/12/11, Print      CONTINUE these medications which have CHANGED   Details  dicyclomine (BENTYL) 10 MG capsule TAKE ONE TABLET 3 TIMES DAILY BEFORE BREAKFAST, LUNCH, AND DINNER., Print    diphenoxylate-atropine  (LOMOTIL) 2.5-0.025 MG per tablet Take 1 tablet by mouth 4 (four) times daily as needed. For diarrhea, Starting 09/05/2011, Until Discontinued, Print    hydrocodone-acetaminophen (LORCET-HD) 5-500 MG per capsule Take 1 capsule by mouth every 6 (six) hours as needed. For pain, Starting 09/05/2011, Until Discontinued, Print      CONTINUE these medications which have NOT CHANGED   Details  ALPRAZolam (XANAX) 1 MG tablet Take 1 mg by mouth at bedtime as needed. For anxiety, Until Discontinued, Historical Med    gabapentin (NEURONTIN) 600 MG tablet Take 600 mg by mouth 3 (three) times daily.  , Until Discontinued, Historical Med    PARoxetine (PAXIL) 20 MG tablet Take 20 mg by mouth every morning.  , Until Discontinued, Historical Med        Discharge Condition: Stable and improved.  Disposition: Home or Self Care   Consults:Najeeb Rehman, MD   Significant Diagnostic Studies: US Abdomen Complete  09/02/2011  *RADIOLOGY REPORT*  Clinical Data: Elevated LFTs.  Abdominal pain.  ABDOMEN ULTRASOUND  Technique:  Complete abdominal ultrasound examination was performed including evaluation of the liver, gallbladder, bile ducts, pancreas, kidneys, spleen, IVC, and abdominal aorta.  Comparison: Abdominal MRI from 11/19/2010.  Abdominal ultrasound from 11/18/2010.  Findings:  Gallbladder:  Surgically absent  Common Bile Duct:  9 mm in diameter.  This is stable comparing back to the previous ultrasound exam.  Liver:  Normal.  No focal parenchymal abnormality.  No biliary dilation.  IVC:  Normal.  Pancreas:  Normal.  Spleen:  Normal.  Right kidney:  11.1 cm in long axis.  Normal.  Left kidney:  11.6 cm in long axis.  Normal.  Abdominal Aorta:  No aneurysm.  IMPRESSION: Stable exam.  Status post cholecystectomy.  There is mild distention of the extrahepatic common duct which is commonly seen after cholecystectomy.  The appearance is stable since 11/18/2010.  Original Report Authenticated By: ERIC A. MANSELL,  M.D.   Mr 3d Recon At Scanner  09/02/2011  *RADIOLOGY REPORT*  Clinical Data:  Biliary colic.  Elevated liver function tests. Prior cholecystectomy 11/19/2010.  MRI ABDOMEN WITHOUT AND WITH CONTRAST (INCLUDING MRCP)  Technique:  Multiplanar multisequence MR imaging of the abdomen was performed both before and after the administration of intravenous contrast. Heavily T2-weighted images of the biliary and pancreatic ducts were obtained, and three-dimensional MRCP images were rendered by post processing.  Contrast: 14mL MULTIHANCE GADOBENATE DIMEGLUMINE 529 MG/ML IV SOLN  Comparison:  None  Findings:  Likely physiologic trace bilateral pleural fluid. Normal heart size.  Hepatomegaly, 19.3 cm cranial caudal.  No steatosis.  No intrahepatic biliary ductal dilatation.  Arterial phase images demonstrate heterogeneous enhancement throughout the liver .  Portal venous and delayed enhancement is symmetric.  Normal spleen, stomach, adrenal glands, kidneys.  Mild edema surrounds the pancreatic head and uncinate process as well as the adjacent descending.  Example image 31 of series 5.  Pancreas enhances normally, without pancreatic ductal dilatation. Surrounding vasculature is patent.  Cholecystectomy without biliary ductal dilatation.  The common duct measures maximally 6 mm on image 25 of series 7.  Normal tapering distally.  No post cholecystectomy fluid collection.  No abdominal adenopathy or ascites.  IMPRESSION:  1.  No biliary ductal dilatation or common duct stone. 2.  Edema adjacent the pancreatic head and adjacent second portion of the duodenum.  Given elevated and amylase level yesterday, likely indicative of uncomplicated pancreatitis.  A component of duodenitis cannot be excluded. 3.  Heterogeneous arterial enhancement within the liver.  This is nonspecific.  Given the elevated liver function tests, acute or subacute hepatitis would be a consideration.  Original Report Authenticated By: Consuello Bossier, M.D.    Mr Abd W/wo Cm/mrcp  09/02/2011  *RADIOLOGY REPORT*  Clinical Data:  Biliary colic.  Elevated liver function tests. Prior cholecystectomy 11/19/2010.  MRI ABDOMEN WITHOUT AND WITH CONTRAST (INCLUDING MRCP)  Technique:  Multiplanar multisequence MR imaging of the abdomen was performed both before and after the administration of intravenous contrast. Heavily T2-weighted images of the biliary and pancreatic ducts were obtained, and three-dimensional MRCP images were rendered by post processing.  Contrast: 14mL MULTIHANCE GADOBENATE DIMEGLUMINE 529 MG/ML IV SOLN  Comparison:  None  Findings:  Likely physiologic trace bilateral pleural fluid. Normal heart size.  Hepatomegaly, 19.3 cm cranial caudal.  No steatosis.  No intrahepatic biliary ductal dilatation.  Arterial phase images demonstrate heterogeneous enhancement throughout the liver .  Portal venous and delayed enhancement is symmetric.  Normal spleen, stomach, adrenal glands, kidneys.  Mild edema surrounds the pancreatic head and uncinate process as well as the adjacent descending.  Example image 31 of series 5.  Pancreas enhances normally, without pancreatic ductal dilatation. Surrounding vasculature is patent.  Cholecystectomy without biliary ductal dilatation.  The common duct measures maximally 6 mm on image 25 of series 7.  Normal tapering distally.  No post cholecystectomy fluid collection.  No abdominal adenopathy or ascites.  IMPRESSION:  1.  No biliary ductal dilatation or common duct stone. 2.  Edema adjacent the pancreatic head and adjacent second portion of the duodenum.  Given elevated and amylase  level yesterday, likely indicative of uncomplicated pancreatitis.  A component of duodenitis cannot be excluded. 3.  Heterogeneous arterial enhancement within the liver.  This is nonspecific.  Given the elevated liver function tests, acute or subacute hepatitis would be a consideration.  Original Report Authenticated By: Consuello Bossier, M.D.    ECHO:Study Conclusions  - Left ventricle: The cavity size was normal. Wall thickness was normal. Systolic function was normal. The estimated ejection fraction was in the range of 55% to 60%. Wall motion was normal; there were no regional wall motion abnormalities. The study is not technically sufficient to allow evaluation of LV diastolic function. - Mitral valve: Trivial regurgitation. - Atrial septum: The septum was thickened. No definite defect or patent foramen ovale was identified. - Tricuspid valve: Trivial regurgitation. - Pericardium, extracardiac: There was no pericardial effusion. Transthoracic echocardiography. M-mode, complete 2D, spectral Doppler, and color Doppler. Height: Height: 167.6cm. Height: 66in. Weight: Weight: 69.9kg. Weight: 153.7lb. Body mass index: BMI: 24.9kg/m^2. Body surface area: BSA: 1.33m^2. Patient status: Inpatient.     Microbiology: Recent Results (from the past 240 hour(s))  CLOSTRIDIUM DIFFICILE BY PCR     Status: Normal   Collection Time   09/04/11 10:00 PM      Component Value Range Status Comment   C difficile by pcr NEGATIVE  NEGATIVE  Final      Labs: No results found for this or any previous visit (from the past 48 hour(s)).   HPI : The patient is a 45 year old woman with a past medical history significant for cholecystectomy, relatively negative ERCPs in the past, but clinically, it was felt that she may have passed a common bile duct stone, and history of elevated liver transaminases. She presented to the emergency department on 09/01/2011 with a chief complaint of right upper quadrant pain and multiple loose and watery stools. In the emergency department, she was noted to be afebrile and mildly tachycardic. Her urinalysis was negative for infection. Her lipase was elevated at 81. Her serum potassium was low at 3.1. She was admitted for further evaluation and management.  HOSPITAL COURSE: The patient was started on IV fluids with  potassium chloride added for IV fluid hydration and repletion of her potassium chloride. Bentyl and Lomotil were temporarily discontinued. Symptomatic treatment was started with as needed antiemetics, as needed analgesics, and IV Protonix. Gastroenterologist, Dr. Karilyn Cota, was consulted. An ultrasound and MRCP as well as stool studies were ordered. The results of the ultrasound and MRCP are located above. In essence, there was no evidence of choledocholithiasis. There were findings suggestive of pancreatitis and a heterogeneous appearance of the liver. The stool studies revealed a negative C. difficile PCR and negative fecal lactoferrin.  A viral hepatitis panel was ordered as well. It was negative.  Her lipase normalized. Her liver transaminases began to decrease, but did not completely normalized at the time of discharge. A blood magnesium level was assessed and it was low at 1.4. She was given intravenous magnesium sulfate. Her serum potassium normalized following supplementation.  Her symptoms subsided and subsequently the diarrhea completely resolved. Therefore, her diet was advanced from a clear liquid diet to a full liquid diet. She tolerated it well.  Dr. Karilyn Cota decided to perform a colonoscopy to assess for microscopic colitis and/or collagenous colitis. The colonoscopy was unremarkable. Biopsies were obtained. The results were pending at the time of discharge.  Following the colonoscopy, the patient was able to tolerate solid foods. I discussed the patient's medication regimen for discharge with  Dr. Karilyn Cota. He recommended that the patient be restarted on Bentyl 3 times daily and Lomotil as needed.    Discharge Exam:  Blood pressure 98/75, pulse 74, temperature 99.1 F (37.3 C), temperature source Oral, resp. rate 14, height 5\' 6"  (1.676 m), weight 68.04 kg (150 lb), SpO2 97.00%.  Lungs: Clear to auscultation bilaterally. Heart: S1, S2, no murmurs rubs or gallops. Abdomen: Positive bowel  sounds, nontender, nondistended. Extremities: No pedal edema.   Discharge Orders    Future Orders Please Complete By Expires   Diet - low sodium heart healthy      Diet general      Increase activity slowly      Discharge instructions      Comments:   Call Dr. Patty Sermons office if you have not heard from them in 2 weeks.      Follow-up Information    Follow up with REHMAN,NAJEEB U, MD. (His office will call with a follow up appointment.)    Contact information:   [address] [place] [address] [phone]          Signed: Maryellen Dowdle 09/07/2011, 6:27 PM

## 2011-09-11 ENCOUNTER — Telehealth (INDEPENDENT_AMBULATORY_CARE_PROVIDER_SITE_OTHER): Payer: Self-pay | Admitting: *Deleted

## 2011-09-11 ENCOUNTER — Encounter (HOSPITAL_COMMUNITY): Payer: Self-pay | Admitting: Internal Medicine

## 2011-09-11 ENCOUNTER — Encounter (INDEPENDENT_AMBULATORY_CARE_PROVIDER_SITE_OTHER): Payer: Self-pay | Admitting: *Deleted

## 2011-09-11 NOTE — Telephone Encounter (Signed)
Per Dr. Karilyn Cota the patient will need LFT prior to office visit in 2 weeks. Forwarded to [employer] as Lorain Childes. Lab order faxed to [facility].

## 2011-09-11 NOTE — Telephone Encounter (Signed)
appt 09/21/11 @ 2:30, letter mailed to patient

## 2011-09-21 ENCOUNTER — Ambulatory Visit (INDEPENDENT_AMBULATORY_CARE_PROVIDER_SITE_OTHER): Payer: 59 | Admitting: Internal Medicine

## 2011-09-23 ENCOUNTER — Encounter (INDEPENDENT_AMBULATORY_CARE_PROVIDER_SITE_OTHER): Payer: Self-pay | Admitting: *Deleted

## 2011-12-21 ENCOUNTER — Telehealth (INDEPENDENT_AMBULATORY_CARE_PROVIDER_SITE_OTHER): Payer: Self-pay | Admitting: *Deleted

## 2011-12-21 NOTE — Telephone Encounter (Signed)
LM wanting Shelia Snyder to please give her a call. "I am having pain over my liver." The return phone number is [phone].

## 2011-12-22 NOTE — Telephone Encounter (Signed)
She says she is better. She started taking Cipro. She would like to hold off on labs right now.

## 2013-04-27 LAB — HM PAP SMEAR: HM Pap smear: NEGATIVE

## 2013-05-09 ENCOUNTER — Telehealth (INDEPENDENT_AMBULATORY_CARE_PROVIDER_SITE_OTHER): Payer: Self-pay | Admitting: *Deleted

## 2013-05-09 NOTE — Telephone Encounter (Signed)
LM stating she is having IBS, diarrhea again. Would like to see if Camelia Eng would please call her something in to [facility]. Please return her call at [phone].

## 2013-05-09 NOTE — Telephone Encounter (Signed)
Apt has been scheduled for 05/11/13 at 4 pm with Dorene Ar, NP.

## 2013-05-09 NOTE — Telephone Encounter (Signed)
Have not seen since 2012. Needs OV. Please call

## 2013-05-11 ENCOUNTER — Ambulatory Visit (INDEPENDENT_AMBULATORY_CARE_PROVIDER_SITE_OTHER): Payer: 59 | Admitting: Internal Medicine

## 2013-05-25 ENCOUNTER — Inpatient Hospital Stay (HOSPITAL_COMMUNITY)
Admission: EM | Admit: 2013-05-25 | Discharge: 2013-05-28 | DRG: 813 | Disposition: A | Discharge status: Home / Self Care | Payer: BC Managed Care – PPO | Attending: Family Medicine | Admitting: Family Medicine

## 2013-05-25 ENCOUNTER — Encounter (HOSPITAL_COMMUNITY): Payer: Self-pay

## 2013-05-25 DIAGNOSIS — R112 Nausea with vomiting, unspecified: Secondary | ICD-10-CM

## 2013-05-25 DIAGNOSIS — K5289 Other specified noninfective gastroenteritis and colitis: Principal | ICD-10-CM | POA: Diagnosis present

## 2013-05-25 DIAGNOSIS — G9340 Encephalopathy, unspecified: Secondary | ICD-10-CM

## 2013-05-25 DIAGNOSIS — E876 Hypokalemia: Secondary | ICD-10-CM | POA: Diagnosis present

## 2013-05-25 DIAGNOSIS — E860 Dehydration: Secondary | ICD-10-CM | POA: Diagnosis present

## 2013-05-25 DIAGNOSIS — R7989 Other specified abnormal findings of blood chemistry: Secondary | ICD-10-CM | POA: Diagnosis present

## 2013-05-25 DIAGNOSIS — D6959 Other secondary thrombocytopenia: Secondary | ICD-10-CM | POA: Diagnosis present

## 2013-05-25 DIAGNOSIS — R109 Unspecified abdominal pain: Secondary | ICD-10-CM | POA: Diagnosis present

## 2013-05-25 DIAGNOSIS — K589 Irritable bowel syndrome without diarrhea: Secondary | ICD-10-CM | POA: Diagnosis present

## 2013-05-25 DIAGNOSIS — R748 Abnormal levels of other serum enzymes: Secondary | ICD-10-CM | POA: Diagnosis present

## 2013-05-25 DIAGNOSIS — Z87891 Personal history of nicotine dependence: Secondary | ICD-10-CM

## 2013-05-25 DIAGNOSIS — R197 Diarrhea, unspecified: Secondary | ICD-10-CM | POA: Diagnosis present

## 2013-05-25 DIAGNOSIS — D72819 Decreased white blood cell count, unspecified: Secondary | ICD-10-CM | POA: Diagnosis present

## 2013-05-25 DIAGNOSIS — T40605A Adverse effect of unspecified narcotics, initial encounter: Secondary | ICD-10-CM | POA: Diagnosis present

## 2013-05-25 DIAGNOSIS — R7402 Elevation of levels of lactic acid dehydrogenase (LDH): Secondary | ICD-10-CM | POA: Diagnosis present

## 2013-05-25 DIAGNOSIS — R000 Tachycardia, unspecified: Secondary | ICD-10-CM | POA: Diagnosis present

## 2013-05-25 DIAGNOSIS — F411 Generalized anxiety disorder: Secondary | ICD-10-CM | POA: Diagnosis present

## 2013-05-25 DIAGNOSIS — F3289 Other specified depressive episodes: Secondary | ICD-10-CM | POA: Diagnosis present

## 2013-05-25 DIAGNOSIS — Z90710 Acquired absence of both cervix and uterus: Secondary | ICD-10-CM

## 2013-05-25 DIAGNOSIS — K529 Noninfective gastroenteritis and colitis, unspecified: Secondary | ICD-10-CM

## 2013-05-25 DIAGNOSIS — Z9089 Acquired absence of other organs: Secondary | ICD-10-CM

## 2013-05-25 DIAGNOSIS — R7401 Elevation of levels of liver transaminase levels: Secondary | ICD-10-CM | POA: Diagnosis present

## 2013-05-25 DIAGNOSIS — F329 Major depressive disorder, single episode, unspecified: Secondary | ICD-10-CM | POA: Diagnosis present

## 2013-05-25 DIAGNOSIS — F419 Anxiety disorder, unspecified: Secondary | ICD-10-CM | POA: Diagnosis present

## 2013-05-25 NOTE — ED Notes (Signed)
Right lower abd paiin onset tonight, states she feels she is dehydrated and reports that she has common bile duct disease and feels that her liver enzymes might be elevated.  Pt also c/o urinary retention.

## 2013-05-26 ENCOUNTER — Emergency Department (HOSPITAL_COMMUNITY): Payer: BC Managed Care – PPO

## 2013-05-26 ENCOUNTER — Encounter (HOSPITAL_COMMUNITY): Payer: Self-pay

## 2013-05-26 ENCOUNTER — Telehealth (INDEPENDENT_AMBULATORY_CARE_PROVIDER_SITE_OTHER): Payer: Self-pay | Admitting: *Deleted

## 2013-05-26 DIAGNOSIS — G9340 Encephalopathy, unspecified: Secondary | ICD-10-CM

## 2013-05-26 DIAGNOSIS — R112 Nausea with vomiting, unspecified: Secondary | ICD-10-CM

## 2013-05-26 DIAGNOSIS — K529 Noninfective gastroenteritis and colitis, unspecified: Secondary | ICD-10-CM

## 2013-05-26 DIAGNOSIS — R740 Nonspecific elevation of levels of transaminase and lactic acid dehydrogenase [LDH]: Secondary | ICD-10-CM

## 2013-05-26 DIAGNOSIS — K5289 Other specified noninfective gastroenteritis and colitis: Principal | ICD-10-CM

## 2013-05-26 DIAGNOSIS — R197 Diarrhea, unspecified: Secondary | ICD-10-CM

## 2013-05-26 LAB — CBC WITH DIFFERENTIAL/PLATELET
Eosinophils Absolute: 0 10*3/uL (ref 0.0–0.7)
Eosinophils Relative: 1 % (ref 0–5)
HCT: 42.8 % (ref 36.0–46.0)
Lymphocytes Relative: 14 % (ref 12–46)
Lymphs Abs: 0.2 10*3/uL — ABNORMAL LOW (ref 0.7–4.0)
MCH: 31.3 pg (ref 26.0–34.0)
MCV: 91.8 fL (ref 78.0–100.0)
Monocytes Absolute: 0 10*3/uL — ABNORMAL LOW (ref 0.1–1.0)
Monocytes Relative: 1 % — ABNORMAL LOW (ref 3–12)
Platelets: 212 10*3/uL (ref 150–400)
RBC: 4.66 MIL/uL (ref 3.87–5.11)

## 2013-05-26 LAB — COMPREHENSIVE METABOLIC PANEL
BUN: 12 mg/dL (ref 6–23)
CO2: 30 mEq/L (ref 19–32)
Calcium: 9.9 mg/dL (ref 8.4–10.5)
Chloride: 99 mEq/L (ref 96–112)
Creatinine, Ser: 0.8 mg/dL (ref 0.50–1.10)
GFR calc Af Amer: >90 mL/min (ref >90)
GFR calc non Af Amer: 85 mL/min — ABNORMAL LOW (ref >90)
Glucose, Bld: 89 mg/dL (ref 70–99)
Total Bilirubin: 0.4 mg/dL (ref 0.3–1.2)

## 2013-05-26 LAB — URINALYSIS, ROUTINE W REFLEX MICROSCOPIC
Hgb urine dipstick: NEGATIVE
Nitrite: NEGATIVE
Protein, ur: NEGATIVE mg/dL
Specific Gravity, Urine: <1.005 — ABNORMAL LOW (ref 1.005–1.030)
Urobilinogen, UA: 0.2 mg/dL (ref 0.0–1.0)

## 2013-05-26 LAB — MRSA PCR SCREENING: MRSA by PCR: NEGATIVE

## 2013-05-26 LAB — ACETAMINOPHEN LEVEL: Acetaminophen (Tylenol), Serum: <15 ug/mL (ref 10–30)

## 2013-05-26 LAB — LIPASE, BLOOD: Lipase: 47 U/L (ref 11–59)

## 2013-05-26 MED ORDER — ONDANSETRON HCL 4 MG/2ML IJ SOLN
8.0000 mg | Freq: Once | INTRAMUSCULAR | Status: AC
  Administered 2013-05-26: 8 mg via INTRAVENOUS
  Filled 2013-05-26: qty 4

## 2013-05-26 MED ORDER — ONDANSETRON HCL 4 MG PO TABS: 4.0000 mg | ORAL_TABLET | Freq: Four times a day (QID) | ORAL | Status: DC | PRN

## 2013-05-26 MED ORDER — HYDROMORPHONE HCL PF 2 MG/ML IJ SOLN
2.0000 mg | Freq: Once | INTRAMUSCULAR | Status: AC
  Administered 2013-05-26: 2 mg via INTRAVENOUS
  Filled 2013-05-26: qty 1

## 2013-05-26 MED ORDER — SODIUM CHLORIDE 0.9 % IV BOLUS (SEPSIS)
1000.0000 mL | Freq: Once | INTRAVENOUS | Status: AC
  Administered 2013-05-26: 1000 mL via INTRAVENOUS

## 2013-05-26 MED ORDER — IOHEXOL 300 MG/ML  SOLN
50.0000 mL | Freq: Once | INTRAMUSCULAR | Status: AC | PRN
  Administered 2013-05-26: 50 mL via ORAL

## 2013-05-26 MED ORDER — LACTATED RINGERS IV BOLUS (SEPSIS)
1000.0000 mL | Freq: Once | INTRAVENOUS | Status: AC
  Administered 2013-05-26: 1000 mL via INTRAVENOUS

## 2013-05-26 MED ORDER — NALOXONE HCL 0.4 MG/ML IJ SOLN
INTRAMUSCULAR | Status: AC
  Filled 2013-05-26: qty 2

## 2013-05-26 MED ORDER — IOHEXOL 300 MG/ML  SOLN
100.0000 mL | Freq: Once | INTRAMUSCULAR | Status: AC | PRN
  Administered 2013-05-26: 100 mL via INTRAVENOUS

## 2013-05-26 MED ORDER — PROMETHAZINE HCL 25 MG/ML IJ SOLN
25.0000 mg | Freq: Once | INTRAMUSCULAR | Status: AC
  Administered 2013-05-26: 25 mg via INTRAVENOUS
  Filled 2013-05-26: qty 1

## 2013-05-26 MED ORDER — HYDROMORPHONE HCL PF 1 MG/ML IJ SOLN
1.0000 mg | Freq: Once | INTRAMUSCULAR | Status: AC
  Administered 2013-05-26: 1 mg via INTRAVENOUS
  Filled 2013-05-26: qty 1

## 2013-05-26 MED ORDER — DICYCLOMINE HCL 10 MG PO CAPS
10.0000 mg | ORAL_CAPSULE | Freq: Once | ORAL | Status: AC
  Administered 2013-05-26: 10 mg via ORAL
  Filled 2013-05-26: qty 1

## 2013-05-26 MED ORDER — POTASSIUM CHLORIDE IN NACL 40-0.9 MEQ/L-% IV SOLN
INTRAVENOUS | Status: DC
  Administered 2013-05-26 (×2): via INTRAVENOUS

## 2013-05-26 MED ORDER — ACETAMINOPHEN 650 MG RE SUPP: 650.0000 mg | Freq: Four times a day (QID) | RECTAL | Status: DC | PRN

## 2013-05-26 MED ORDER — LOPERAMIDE HCL 2 MG PO CAPS
4.0000 mg | ORAL_CAPSULE | ORAL | Status: DC | PRN
  Administered 2013-05-26 (×2): 4 mg via ORAL
  Filled 2013-05-26 (×2): qty 2

## 2013-05-26 MED ORDER — NALOXONE HCL 0.4 MG/ML IJ SOLN
0.4000 mg | Freq: Once | INTRAMUSCULAR | Status: AC
  Administered 2013-05-26: 0.4 mg via INTRAVENOUS

## 2013-05-26 MED ORDER — SODIUM CHLORIDE 0.9 % IJ SOLN
3.0000 mL | Freq: Two times a day (BID) | INTRAMUSCULAR | Status: DC
  Administered 2013-05-27: 3 mL via INTRAVENOUS

## 2013-05-26 MED ORDER — ALUM & MAG HYDROXIDE-SIMETH 200-200-20 MG/5ML PO SUSP: 30.0000 mL | Freq: Four times a day (QID) | ORAL | Status: DC | PRN

## 2013-05-26 MED ORDER — CITALOPRAM HYDROBROMIDE 20 MG PO TABS
20.0000 mg | ORAL_TABLET | Freq: Every day | ORAL | Status: DC
  Administered 2013-05-26 – 2013-05-28 (×3): 20 mg via ORAL
  Filled 2013-05-26 (×3): qty 1

## 2013-05-26 MED ORDER — DICYCLOMINE HCL 10 MG PO CAPS
10.0000 mg | ORAL_CAPSULE | Freq: Three times a day (TID) | ORAL | Status: DC
  Administered 2013-05-26 – 2013-05-28 (×7): 10 mg via ORAL
  Filled 2013-05-26 (×22): qty 1

## 2013-05-26 MED ORDER — ACETAMINOPHEN 325 MG PO TABS
650.0000 mg | ORAL_TABLET | Freq: Four times a day (QID) | ORAL | Status: DC | PRN
  Administered 2013-05-26 – 2013-05-27 (×2): 650 mg via ORAL
  Filled 2013-05-26 (×2): qty 2

## 2013-05-26 MED ORDER — GABAPENTIN 300 MG PO CAPS
600.0000 mg | ORAL_CAPSULE | Freq: Three times a day (TID) | ORAL | Status: DC
  Administered 2013-05-26 – 2013-05-28 (×7): 600 mg via ORAL
  Filled 2013-05-26 (×2): qty 2
  Filled 2013-05-26: qty 1
  Filled 2013-05-26 (×4): qty 2
  Filled 2013-05-26: qty 1

## 2013-05-26 MED ORDER — DEXTROSE IN LACTATED RINGERS 5 % IV SOLN
INTRAVENOUS | Status: DC
  Administered 2013-05-26: 03:00:00 via INTRAVENOUS

## 2013-05-26 MED ORDER — ONDANSETRON HCL 4 MG/2ML IJ SOLN: 4.0000 mg | Freq: Four times a day (QID) | INTRAMUSCULAR | Status: DC | PRN

## 2013-05-26 NOTE — Progress Notes (Signed)
PT'S SBP REMAINS AROUND 85/. HR 83, R 14-16. O2 SAT 99% ON O2 AT 2L/MIN VIA [place]. DR GOODRICH TEXTED THIS INFO AND HE CALLED W/   CALLED TO CHECK ON PT. NEW ORDER WRITTEN.

## 2013-05-26 NOTE — ED Notes (Signed)
Patient medicated per order for continued pain.

## 2013-05-26 NOTE — ED Notes (Signed)
Patient medicated per order for continued nausea.  Patient ambulatory to bathroom again; states she is having diarrhea.

## 2013-05-26 NOTE — ED Notes (Signed)
Patient lying on stretcher with eyes closed.  Respirations even and unlabored; equal rise and fall of chest noted.  Patient arouses to verbal stimuli.

## 2013-05-26 NOTE — ED Notes (Signed)
0440-Dr. Bonk at bedside.  Order given for Narcon 0.4mg  IV.   Medication given and patient is now responding.  Patient remains tachycardic with rate of 123.  Pupils are now 3 and PERL.  Patient answering questions.  C/o feeling cold.  Patient given warm blanket.

## 2013-05-26 NOTE — ED Notes (Signed)
Patient states pain is not getting any better after receiving Dilaudid.  Patient ambulatory to bathroom to obtain urine specimen.

## 2013-05-26 NOTE — ED Notes (Signed)
Patient in bathroom at this time.

## 2013-05-26 NOTE — ED Notes (Signed)
0420-patient on her knees in the bathroom with stool on her, the floor and commode.  Patient with pinpointed pupils and slow respirations.  Patient taken back to room and cleaned.  Patient placed on cardiac monitor; patient is tachycardic with rate of 117.  Patient placed on O2 at 2L//min via [place].  Patient will arouse to verbal stimuli, but continues to have decreased respirations even with stimulation.

## 2013-05-26 NOTE — ED Provider Notes (Signed)
History    CSN: [id number] Arrival date & time 05/25/13  2327  First MD Initiated Contact with Patient 05/26/13 0009     Chief Complaint  Patient presents with  . Abdominal Pain   HPI Shelia Snyder is a 47 y.o. female presents with epigastric pain.  Patient has a history of irritable bowel syndrome and gallstone pancreatitis according to her, this pain has been increasing over the last few days, but nausea has been increasing as well, she's been vomiting, the pain is sharp, stabbing, constant, intermittently worse, radiates to the back, she's had subjective fevers. She says she's had no color change in her stools or urine, no white or chalky stools, no hematochezia or melena. She has had increase in diarrhea. Denies any chest pain, shortness of breath, recent illness, rash, myalgias, arthralgias.  Past Medical History  Diagnosis Date  . Elevated liver enzymes   . Diarrhea   . IBS (irritable bowel syndrome) 09/04/2011  . Left leg pain     related to L4-L5 ruptured disc  . Depression   . Anxiety    Past Surgical History  Procedure Laterality Date  . Total abdominal hysterectomy    . Cholecystectomy    . Back surgery    . Breast lumpectomy      benign  . Colonoscopy  09/05/2011    Procedure: COLONOSCOPY;  Surgeon: Malissa Hippo, MD;  Location: [facility];  Service: Endoscopy;  Laterality: N/A;   Family History  Problem Relation Age of Onset  . Colon cancer Other    History  Substance Use Topics  . Smoking status: Former Smoker -- 1.00 packs/day for 19 years  . Smokeless tobacco: Not on file  . Alcohol Use: No   OB History   Grav Para Term Preterm Abortions TAB SAB Ect Mult Living                 Review of Systems At least 10pt or greater review of systems completed and are negative except where specified in the HPI.  Allergies  Sulfa antibiotics  Home Medications   Current Outpatient Rx  Name  Route  Sig  Dispense  Refill  . ALPRAZolam (XANAX) 1 MG tablet    Oral   Take 1 mg by mouth at bedtime as needed. For anxiety         . citalopram (CELEXA) 20 MG tablet   Oral   Take 20 mg by mouth daily.         Marland Kitchen dicyclomine (BENTYL) 10 MG capsule      TAKE ONE TABLET 3 TIMES DAILY BEFORE BREAKFAST, LUNCH, AND DINNER.   90 capsule   0   . diphenoxylate-atropine (LOMOTIL) 2.5-0.025 MG per tablet   Oral   Take 1 tablet by mouth 4 (four) times daily as needed. For diarrhea   30 tablet   0   . gabapentin (NEURONTIN) 600 MG tablet   Oral   Take 600 mg by mouth 3 (three) times daily.           . hydrocodone-acetaminophen (LORCET-HD) 5-500 MG per capsule   Oral   Take 1 capsule by mouth every 6 (six) hours as needed. For pain   30 capsule   0   . HYDROcodone-acetaminophen (NORCO/VICODIN) 5-325 MG per tablet   Oral   Take 1 tablet by mouth every 6 (six) hours as needed for pain.         Marland Kitchen PARoxetine (PAXIL) 20 MG tablet  Oral   Take 20 mg by mouth every morning.            BP 128/80  Pulse 116  Temp(Src) 97.9 F (36.6 C) (Oral)  Resp 22  SpO2 100% Physical Exam  Nursing notes reviewed.  Electronic medical record reviewed. VITAL SIGNS:   Filed Vitals:   05/25/13 2347  BP: 128/80  Pulse: 116  Temp: 97.9 F (36.6 C)  TempSrc: Oral  Resp: 22  SpO2: 100%   CONSTITUTIONAL: Awake, oriented, well-developed, appears non-toxic but uncomfortable HENT: Atraumatic, normocephalic, oral mucosa pink and moist, airway patent. Nares patent without drainage. External ears normal. EYES: Conjunctiva clear, EOMI, PERRLA NECK: Trachea midline, non-tender, supple CARDIOVASCULAR: Normal heart rate, Normal rhythm, No murmurs, rubs, gallops PULMONARY/CHEST: Clear to auscultation, no rhonchi, wheezes, or rales. Symmetrical breath sounds. Non-tender. ABDOMINAL: Non-distended, soft, tenderness to palpation in the epigastrium without rebound or guarding.  BS normal. NEUROLOGIC: Non-focal, moving all four extremities, no gross sensory or  motor deficits. EXTREMITIES: No clubbing, cyanosis, or edema SKIN: Warm, Dry, No erythema, No rash  ED Course  Procedures (including critical care time) Labs Reviewed  CBC WITH DIFFERENTIAL - Abnormal; Notable for the following:    WBC 1.5 (*)    Neutrophils Relative % 84 (*)    Neutro Abs 1.3 (*)    Lymphs Abs 0.2 (*)    Monocytes Relative 1 (*)    Monocytes Absolute 0.0 (*)    All other components within normal limits  LIPASE, BLOOD  COMPREHENSIVE METABOLIC PANEL  URINALYSIS, ROUTINE W REFLEX MICROSCOPIC   Ct Abdomen Pelvis W Contrast  05/26/2013   *RADIOLOGY REPORT*  Clinical Data: Right upper quadrant pain and diarrhea.  History of common bile ducts disease.  CT ABDOMEN AND PELVIS WITH CONTRAST  Technique:  Multidetector CT imaging of the abdomen and pelvis was performed following the standard protocol during bolus administration of intravenous contrast.  Contrast: 50mL OMNIPAQUE IOHEXOL 300 MG/ML  SOLN, OMNIPAQUE IOHEXOL 300 MG/ML  SOLN  Comparison: CT abdomen and pelvis 11/18/2010.  MRI abdomen 09/02/2011.  Findings: Calcified granuloma in the left lung base.  Lungs are otherwise clear.  Surgical absence of the gallbladder.  Mild bile duct dilatation which is probably postoperative.  Periportal edema is demonstrated as previously.  This is nonspecific and can represent normal variation or may be associated with inflammatory process.  The pancreas, spleen, adrenal glands, kidneys, abdominal aorta, inferior vena cava, and retroperitoneal lymph nodes are unremarkable.  The stomach and small bowel are not abnormally distended and no wall thickening is appreciated.  Diffusely stool filled colon without distension or wall thickening.  No free air or free fluid in the abdomen.  Abdominal wall musculature appears intact.  Pelvis:  The appendix is normal.  The bladder wall is not thickened.  The uterus is surgically absent.  No abnormal adnexal masses.  No significant pelvic lymphadenopathy.   Stool filled rectosigmoid colon without diverticulitis.  No free or loculated pelvic fluid collections.  Normal alignment of the lumbar vertebrae.  Mild degenerative changes.  IMPRESSION: Nonspecific diffuse periportal edema is stable since previous study.  Surgical absence of the gallbladder.  Diffusely stool filled colon without evidence of obstruction.   Original Report Authenticated By: Burman Nieves, M.D.   1. Abdominal pain   2. Diarrhea   3. LFT elevation   4. Leukopenia     MDM  Patient presenting with severe epigastric and abdominal pain. Patient also has nausea vomiting and diarrhea, symptoms have been  prolonged but worsened over the last couple of days. Patient is concerned she may have pancreatitis or gallstone pancreatitis.  Patient does have history of irritable bowel syndrome as well as chronic diarrhea, however this seems to have worsened.   Labs are fairly unremarkable, patient has long history of elevated LFTs, AST and ALT are slightly elevated however, lipase is normal, no obstruction is evident, bilirubin is unremarkable. Patient's white blood cell count is low 1.5 giving the suggestion that she could possibly have a viral illness. No history of chronic viral illness, immunosuppression, or leukopenia inducing medications.  CT is unremarkable and unchanged from prior, no new findings.   05/26/2013 5:00 AM Patient reevaluated, patient is somnolent, mildly tachycardic.  Patient was found in the restroom soiled with diarrhea.  Patient was found with her medications that she takes at home, there is a mix of medicines, bottle is labeled with Xanax. Unclear whether not the patient took any of them.  Patient given 0.4 mg of Narcan with improvement of respirations, patient is easily aroused to voice alone. Pt did receive 25 mg of Phenergan, however I do not expect this level somnolence with Phenergan even in synergy with other pain medicine as she was alert and responsive prior to going  into the bathroom. It is possible she is having an adverse reaction to the Phenergan with increased somnolence and some tachycardia secondary to anticholinergic activity-We'll continue to monitor  05/26/2013 7:01 AM Patient reevaluated, patient is still somnolent, tachycardic, repeat fluid bolus.  Fluid in it patient for dehydration secondary to nausea vomiting and diarrhea-I think this is likely secondary to acute gastroenteritis in light of her leukopenia versus exacerbation of her IBS.  Discussed with Dr. Irene Limbo at 07 20 for admission   Jones Skene, MD 05/26/13 [phone]

## 2013-05-26 NOTE — Telephone Encounter (Signed)
Shelia Snyder, Patient is going to be admitted. I will go see her when we are consulted.

## 2013-05-26 NOTE — Consult Note (Signed)
Reason for Consult:elevated transaminases Referring Physician: Hospitalist  Shelia Snyder is an 47 y.o. female.  HPI:  Admitted thru the ED early this am with N,V, and diarrhea.  She received 2 doses Dilaudid IV and apparently went to the BR and was found lying in the floor covered in stool. She received Narcan in the ED. She was admitted to [facility] for depressed respirations for observation. She tells me she has been sick for 2 months or more with nausea, vomiting, and diarrhea. Low grade fever was associated with her symptoms.  She also hasrt mid abdominal pain which radiated into her back. She says she has lost about 12 pounds since becoming sick. Admission weight 153. In 16109 weight 148.  No recent antibiotics. She usually takes 1-2 Xanax's a day.  Hx of elevated liver enzymes.  Her appetite has not been good. She has been having 3-4 loose stools a day for the past 2 month. No melena or right red rectal bleeding. She had an   appointment at our office a couple of weeks ago but skipped her appt. She apparently felt better and was also taking care of her children.    .  07/01/09 ERCP Dr. Karilyn Cota [facility]: for sudden onset of rt upper quadrant pain, elevated transaminases, fever, and chills and suspected to have cholangitis.  Final diagnosis: Mildly dilated biliary system without filling defects or stones or stricture. Short cystic duct remnant without filling defects. Biliary sphincterotomy was slightly extended and stone balloon extractor passed across the duct and ampulla. It is likely she passed a stone again.  October 2005: ERCP with spincterotomy and [facility]: No stones were found and it was felt that she must have passed it spontaneously. Status post cholecystectomy 20 over 20 yrs ago.   Acute Hepatitis Panel has been negative in the past (2012)   Past Medical History  Diagnosis Date  . Elevated liver enzymes   . Diarrhea   . IBS (irritable bowel syndrome) 09/04/2011  . Left leg pain      related to L4-L5 ruptured disc  . Depression   . Anxiety     Past Surgical History  Procedure Laterality Date  . Total abdominal hysterectomy    . Cholecystectomy    . Back surgery    . Breast lumpectomy      benign  . Colonoscopy  09/05/2011    Procedure: COLONOSCOPY;  Surgeon: Malissa Hippo, MD;  Location: [facility];  Service: Endoscopy;  Laterality: N/A;    Family History  Problem Relation Age of Onset  . Colon cancer Other     Social History:  reports that she has quit smoking. She does not have any smokeless tobacco history on file. She reports that she does not drink alcohol or use illicit drugs.  Allergies:  Allergies  Allergen Reactions  . Sulfa Antibiotics     Medications: I have reviewed the patient's current medications.  Results for orders placed during the hospital encounter of 05/25/13 (from the past 48 hour(s))  CBC WITH DIFFERENTIAL     Status: Abnormal   Collection Time    05/26/13 12:41 AM      Result Value Range   WBC 1.5 (*) 4.0 - 10.5 K/uL   RBC 4.66  3.87 - 5.11 MIL/uL   Hemoglobin 14.6  12.0 - 15.0 g/dL   HCT 60.4  54.0 - 98.1 %   MCV 91.8  78.0 - 100.0 fL   MCH 31.3  26.0 - 34.0 pg  MCHC 34.1  30.0 - 36.0 g/dL   RDW 96.2  95.2 - 84.1 %   Platelets 212  150 - 400 K/uL   Neutrophils Relative % 84 (*) 43 - 77 %   Neutro Abs 1.3 (*) 1.7 - 7.7 K/uL   Lymphocytes Relative 14  12 - 46 %   Lymphs Abs 0.2 (*) 0.7 - 4.0 K/uL   Monocytes Relative 1 (*) 3 - 12 %   Monocytes Absolute 0.0 (*) 0.1 - 1.0 K/uL   Eosinophils Relative 1  0 - 5 %   Eosinophils Absolute 0.0  0.0 - 0.7 K/uL   Basophils Relative 0  0 - 1 %   Basophils Absolute 0.0  0.0 - 0.1 K/uL  LIPASE, BLOOD     Status: None   Collection Time    05/26/13 12:41 AM      Result Value Range   Lipase 47  11 - 59 U/L  COMPREHENSIVE METABOLIC PANEL     Status: Abnormal   Collection Time    05/26/13 12:41 AM      Result Value Range   Sodium 138  135 - 145 mEq/L   Potassium 3.2 (*)  3.5 - 5.1 mEq/L   Chloride 99  96 - 112 mEq/L   CO2 30  19 - 32 mEq/L   Glucose, Bld 89  70 - 99 mg/dL   BUN 12  6 - 23 mg/dL   Creatinine, Ser 3.24  0.50 - 1.10 mg/dL   Calcium 9.9  8.4 - 40.1 mg/dL   Total Protein 6.9  6.0 - 8.3 g/dL   Albumin 4.3  3.5 - 5.2 g/dL   AST 027 (*) 0 - 37 U/L   ALT 153 (*) 0 - 35 U/L   Alkaline Phosphatase 81  39 - 117 U/L   Total Bilirubin 0.4  0.3 - 1.2 mg/dL   GFR calc non Af Amer 85 (*) >90 mL/min   GFR calc Af Amer >90  >90 mL/min   Comment:            The eGFR has been calculated     using the CKD EPI equation.     This calculation has not been     validated in all clinical     situations.     eGFR's persistently     <90 mL/min signify     possible Chronic Kidney Disease.  MAGNESIUM     Status: None   Collection Time    05/26/13 12:41 AM      Result Value Range   Magnesium 1.8  1.5 - 2.5 mg/dL  URINALYSIS, ROUTINE W REFLEX MICROSCOPIC     Status: Abnormal   Collection Time    05/26/13  1:45 AM      Result Value Range   Color, Urine STRAW (*) YELLOW   APPearance CLEAR  CLEAR   Specific Gravity, Urine <1.005 (*) 1.005 - 1.030   pH 6.0  5.0 - 8.0   Glucose, UA NEGATIVE  NEGATIVE mg/dL   Hgb urine dipstick NEGATIVE  NEGATIVE   Bilirubin Urine NEGATIVE  NEGATIVE   Ketones, ur NEGATIVE  NEGATIVE mg/dL   Protein, ur NEGATIVE  NEGATIVE mg/dL   Urobilinogen, UA 0.2  0.0 - 1.0 mg/dL   Nitrite NEGATIVE  NEGATIVE   Leukocytes, UA NEGATIVE  NEGATIVE   Comment: MICROSCOPIC NOT DONE ON URINES WITH NEGATIVE PROTEIN, BLOOD, LEUKOCYTES, NITRITE, OR GLUCOSE <1000 mg/dL.    Ct Abdomen Pelvis W Contrast  05/26/2013   *RADIOLOGY REPORT*  Clinical Data: Right upper quadrant pain and diarrhea.  History of common bile ducts disease.  CT ABDOMEN AND PELVIS WITH CONTRAST  Technique:  Multidetector CT imaging of the abdomen and pelvis was performed following the standard protocol during bolus administration of intravenous contrast.  Contrast: 50mL OMNIPAQUE  IOHEXOL 300 MG/ML  SOLN, OMNIPAQUE IOHEXOL 300 MG/ML  SOLN  Comparison: CT abdomen and pelvis 11/18/2010.  MRI abdomen 09/02/2011.  Findings: Calcified granuloma in the left lung base.  Lungs are otherwise clear.  Surgical absence of the gallbladder.  Mild bile duct dilatation which is probably postoperative.  Periportal edema is demonstrated as previously.  This is nonspecific and can represent normal variation or may be associated with inflammatory process.  The pancreas, spleen, adrenal glands, kidneys, abdominal aorta, inferior vena cava, and retroperitoneal lymph nodes are unremarkable.  The stomach and small bowel are not abnormally distended and no wall thickening is appreciated.  Diffusely stool filled colon without distension or wall thickening.  No free air or free fluid in the abdomen.  Abdominal wall musculature appears intact.  Pelvis:  The appendix is normal.  The bladder wall is not thickened.  The uterus is surgically absent.  No abnormal adnexal masses.  No significant pelvic lymphadenopathy.  Stool filled rectosigmoid colon without diverticulitis.  No free or loculated pelvic fluid collections.  Normal alignment of the lumbar vertebrae.  Mild degenerative changes.  IMPRESSION: Nonspecific diffuse periportal edema is stable since previous study.  Surgical absence of the gallbladder.  Diffusely stool filled colon without evidence of obstruction.   Original Report Authenticated By: Burman Nieves, M.D.    ROS Blood pressure 90/62, pulse 90, temperature 97.8 F (36.6 C), temperature source Oral, resp. rate 11, height 5\' 6"  (1.676 m), weight 153 lb 10.6 oz (69.7 kg), SpO2 100.00%. Physical Exam Alert and oriented. Skin warm and dry. Oral mucosa is moist.   . Sclera anicteric, conjunctivae is pink. Thyroid not enlarged. No cervical lymphadenopathy. Lungs clear. Heart regular rate and rhythm.  Abdomen is soft. Bowel sounds are positive. No hepatomegaly. No abdominal masses felt. Tenderness rt  mid abdomen and rt upper quadrant..  No edema to lower extremities.  Patient is very drowsy. Assessment/Plan: Elevated transaminases. Nausea, vomiting, diarrhea. Hx of elevated transaminases in the past. GI pathogen panel, stool culture, c-diff are pending. Will get labs from Dr. Loney Hering. Further recommendations once we have this information. I have discussed with Dr. Karilyn Cota and he will see patient later today.     SETZER,TERRI W 05/26/2013, 10:15 AM     GI attending note; Patient interviewed and examined. Current abdominal pelvic CT from this morning reviewed along with Dr. Tyron Russell and compared with the prior CT of January 2012. MRCP from January 2012 and October 2012 also reviewed. This is the third admission for more or less similar symptomatology although on her first admission in January 2012 her transaminases were well over 1000 and felt to be due to ischemic hepatitis. On her second admission in October 2012 transaminases were elevated to a lesser degree and this time they're mildly elevated. I do not believe she has choledocholithiasis. There is system is well outlined on CT no filling defects are seen. CHD is mildly dilated but CBD is normal. She could have a viral illness which might explain all of her symptoms. Stool studies in the past all been negative. She does not have any other symptoms to suggest carcinoid. If stool studies are negative may consider  evaluation for AIP. GI pathogen panel is pending. Will repeat LFTs in a.m. Patient will be reevaluated in a.m.

## 2013-05-26 NOTE — H&P (Signed)
History and Physical  Shelia Snyder ZOX:[id number] DOB: 1966-03-03 DOA: 05/25/2013  Referring physician: Dr. Rulon Abide PCP: Ernestine Conrad, MD   Chief Complaint: Diarrhea  HPI:  47 year old woman presented to the emergency department with two-week history of nausea, vomiting and diarrhea as well as epigastric abdominal pain. Initial evaluation included nonacute CT of the abdomen and pelvis. Laboratory abnormalities appeared to be chronic. Patient was treated with was pain medication and antiemetics and while in the ER she developed acute encephalopathy and referred for admission for nausea, vomiting, diarrhea, abdominal pain and acute encephalopathy.  History obtained from EDP, chart review, limited from patient. Patient reports a two-week history of increased nausea, vomiting and diarrhea with associated poor oral intake. She said intermittent diarrhea, not everyday over the last 2 weeks but daily vomiting several episodes. She has had increasing abdominal pain which is located in the epigastric area. By review of chart she contacted her gastroenterologist office more than 2 weeks ago for advice, office appointment was recommended but apparently patient did not followup. She has a history of elevated serum transaminases which have been extensively investigated including multiple ERCPs, abdominal ultrasound and negative hepatitis panel. She has had a colonoscopy in 2012 which was unremarkable.  In the emergency department she was afebrile with stable vital signs with mild tachycardia. Initially alert and appropriate. She was treated pain medications and antiemetics. She went to the bathroom, was found on the floor covered in diarrhea. She was noted to have pulled out of on her including Xanax. She was noted to have acute mental status change, somnolence but no focal deficits. She was treated with Narcan with improvement in respirations but no mental status. It was suspected acute encephalopathy secondary to self  administration medications although patient denies this. Serum transaminases are indeed elevated consistent with previous values. Total bilirubin normal. CBC revealed leukopenia. CT of the abdomen and pelvis was unremarkable. Review of Systems:  Negative for fever, visual changes, sore throat, rash, new muscle aches, chest pain, SOB, dysuria, bleeding  Past Medical History  Diagnosis Date  . Elevated liver enzymes   . Diarrhea   . IBS (irritable bowel syndrome) 09/04/2011  . Left leg pain     related to L4-L5 ruptured disc  . Depression   . Anxiety     Past Surgical History  Procedure Laterality Date  . Total abdominal hysterectomy    . Cholecystectomy    . Back surgery    . Breast lumpectomy      benign  . Colonoscopy  09/05/2011    Procedure: COLONOSCOPY;  Surgeon: Malissa Hippo, MD;  Location: [facility];  Service: Endoscopy;  Laterality: N/A;    Social History:  reports that she has quit smoking. She does not have any smokeless tobacco history on file. She reports that she does not drink alcohol or use illicit drugs.  Allergies  Allergen Reactions  . Sulfa Antibiotics     Family History  Problem Relation Age of Onset  . Colon cancer Other      Prior to Admission medications   Medication Sig Start Date End Date Taking? Authorizing Provider  ALPRAZolam Prudy Feeler) 1 MG tablet Take 1 mg by mouth at bedtime as needed. For anxiety   Yes Historical Provider, MD  citalopram (CELEXA) 20 MG tablet Take 20 mg by mouth daily.   Yes Historical Provider, MD  dicyclomine (BENTYL) 10 MG capsule TAKE ONE TABLET 3 TIMES DAILY BEFORE BREAKFAST, LUNCH, AND DINNER. 09/05/11  Yes Elliot Cousin,  MD  diphenoxylate-atropine (LOMOTIL) 2.5-0.025 MG per tablet Take 1 tablet by mouth 4 (four) times daily as needed. For diarrhea 09/05/11  Yes Elliot Cousin, MD  gabapentin (NEURONTIN) 600 MG tablet Take 600 mg by mouth 3 (three) times daily.     Yes Historical Provider, MD   HYDROcodone-acetaminophen (NORCO/VICODIN) 5-325 MG per tablet Take 1 tablet by mouth every 6 (six) hours as needed for pain.   Yes Historical Provider, MD   Physical Exam: Filed Vitals:   05/26/13 0442 05/26/13 0529 05/26/13 0558 05/26/13 0721  BP: 117/68 105/70 104/55 106/67  Pulse: 103 106  118  Temp:      TempSrc:      Resp:  16 16 16   SpO2: 99% 96% 96% 92%    General: Examined in the emergency department. Easily arousable but has difficulty maintaining alertness. Falls asleep easily. Nontoxic. Psychiatric: Difficult to assess mood or affect. Alert, oriented to name, hospital, month, year. Eyes: Pupils equal, round, reactive to light. Normal lids, irises, conjunctiva. ENT: Grossly normal hearing, lips and tongue. Neck: No lymphadenopathy or masses. No thyromegaly. Cardiovascular: Regular rate and rhythm. No murmur, rub, gallop. No lower extremity edema. Dorsalis pedis pulses 2+ bilaterally. Respiratory: Clear to auscultation bilaterally. No wheezes, rales, rhonchi. Normal respiratory effort. Abdomen: Positive bowel sounds. Soft, nondistended. Mild epigastric pain with palpation. No guarding. Skin: Appears grossly unremarkable. Neurologic: Cranial nerves 2-12 appear grossly intact. Moves all extremities well, grossly normal tone and strength, symmetric. Face appears symmetric.  Wt Readings from Last 3 Encounters:  09/05/11 68.04 kg (150 lb)  09/05/11 68.04 kg (150 lb)  09/01/11 69.809 kg (153 lb 14.4 oz)    Labs on Admission:  Basic Metabolic Panel:  Recent Labs Lab 05/26/13 0041  NA 138  K 3.2*  CL 99  CO2 30  GLUCOSE 89  BUN 12  CREATININE 0.80  CALCIUM 9.9    Liver Function Tests:  Recent Labs Lab 05/26/13 0041  AST 235*  ALT 153*  ALKPHOS 81  BILITOT 0.4  PROT 6.9  ALBUMIN 4.3    Recent Labs Lab 05/26/13 0041  LIPASE 47     CBC:  Recent Labs Lab 05/26/13 0041  WBC 1.5*  NEUTROABS 1.3*  HGB 14.6  HCT 42.8  MCV 91.8  PLT 212      Radiological Exams on Admission: Ct Abdomen Pelvis W Contrast  05/26/2013   *RADIOLOGY REPORT*  Clinical Data: Right upper quadrant pain and diarrhea.  History of common bile ducts disease.  CT ABDOMEN AND PELVIS WITH CONTRAST  Technique:  Multidetector CT imaging of the abdomen and pelvis was performed following the standard protocol during bolus administration of intravenous contrast.  Contrast: 50mL OMNIPAQUE IOHEXOL 300 MG/ML  SOLN, OMNIPAQUE IOHEXOL 300 MG/ML  SOLN  Comparison: CT abdomen and pelvis 11/18/2010.  MRI abdomen 09/02/2011.  Findings: Calcified granuloma in the left lung base.  Lungs are otherwise clear.  Surgical absence of the gallbladder.  Mild bile duct dilatation which is probably postoperative.  Periportal edema is demonstrated as previously.  This is nonspecific and can represent normal variation or may be associated with inflammatory process.  The pancreas, spleen, adrenal glands, kidneys, abdominal aorta, inferior vena cava, and retroperitoneal lymph nodes are unremarkable.  The stomach and small bowel are not abnormally distended and no wall thickening is appreciated.  Diffusely stool filled colon without distension or wall thickening.  No free air or free fluid in the abdomen.  Abdominal wall musculature appears intact.  Pelvis:  The appendix is normal.  The bladder wall is not thickened.  The uterus is surgically absent.  No abnormal adnexal masses.  No significant pelvic lymphadenopathy.  Stool filled rectosigmoid colon without diverticulitis.  No free or loculated pelvic fluid collections.  Normal alignment of the lumbar vertebrae.  Mild degenerative changes.  IMPRESSION: Nonspecific diffuse periportal edema is stable since previous study.  Surgical absence of the gallbladder.  Diffusely stool filled colon without evidence of obstruction.   Original Report Authenticated By: Burman Nieves, M.D.    Principal Problem:   Nausea and vomiting Active Problems:    Diarrhea   Abdominal pain   Dehydration   LFT elevation   Anxiety   IBS (irritable bowel syndrome)   Gastroenteritis   Acute encephalopathy   Assessment/Plan 1. Nausea, vomiting, diarrhea: With associated epigastric and generalized abdominal pain. Subacute, present 2 weeks. CT findings chronic and stable. No acute findings noted. Supportive care, IV fluids, antiemetics. Check stool studies, C. difficile, GI pathogen panel. Abdominal exam is benign. Follow clinically. 2. Possible acute gastroenteritis: Plan as above. Leukopenia may be secondary to viral infection. 3. Hypokalemia: Replete. Check serum magnesium. 4. Acute encephalopathy: Developed while in the emergency department. May be secondary to self administration of home medications versus pain medication and Phenergan given timing. Nontoxic. Monitor clinically. No focal signs or symptoms. 5. Elevated serum transaminases: Chronic. Ultrasound of the abdomen and MRCP negative 08/2011 except for acute pancreatitis. By chart review patient also had ERCP 06/2009 [facility] which was negative as well as ERCP at [facility] in the past as well. Results of that are not available. Will consult GI. 6. Dehydration: IV fluids. Likely etiology of tachycardia. No evidence of sepsis at this point. 7. History of irritable bowel: Continue Bentyl. 8. Anxiety, depression: Hold off on benzodiazepines for now. Continue Celexa.   Code Status: Full code  DVT prophylaxis: SCDs Family Communication: None present Disposition Plan/Anticipated LOS: Observation. 24-48 hours.  Time spent: 55 minutes  Brendia Sacks, MD  [facility] Pager [phone] 05/26/2013, 8:15 AM

## 2013-05-26 NOTE — Telephone Encounter (Signed)
Patient called and LM stating she has presented herself to the [facility] ED. Asking Dorene Ar, NP to please come over there first thing this am, 05/26/13. "The same thing is happening to her and she is scared." Dorene Ar, NP was advised.

## 2013-05-26 NOTE — Progress Notes (Signed)
INITIAL NUTRITION ASSESSMENT  INTERVENTION: RD will follow diet advancement and monitor nutrition needs   NUTRITION DIAGNOSIS: Inadequate oral intake related to N/V/D past 2 weeks as evidenced by PMH which includes daily vomiting.   Goal: Pt to meet >/= 90% of their estimated nutrition needs  Monitor:  Pt diet advancement, Po intake, labs and wt trends  Reason for Assessment: Malnutrition Screen Score = 2  47 y.o. female  Admitting Dx: Nausea and vomiting  ASSESSMENT: Pt alone at currently. Presented to ED with nausea, vomiting for 2 weeks prior to admission. She says that she would try to eat/drink something to  keep her son from being concerned about her but would then later be vomiting. GI team also notes pt has been having loose stools over past 2 months. No significant weight change noted per hx. She is at risk for malnutrition given her poor tolerance of oral intake.Will follow for diet advancement and add oral nutrition supplement.  Height: Ht Readings from Last 1 Encounters:  05/26/13 5\' 6"  (1.676 m)    Weight: Wt Readings from Last 1 Encounters:  05/26/13 153 lb 10.6 oz (69.7 kg)    Ideal Body Weight: 130# (59 kg)  % Ideal Body Weight: 118%  Wt Readings from Last 10 Encounters:  05/26/13 153 lb 10.6 oz (69.7 kg)  09/05/11 150 lb (68.04 kg)  09/05/11 150 lb (68.04 kg)  09/01/11 153 lb 14.4 oz (69.809 kg)  09/02/09 132 lb (59.875 kg)    Usual Body Weight: 150#  % Usual Body Weight: 102%  BMI:  Body mass index is 24.81 kg/(m^2).normal range  Estimated Nutritional Needs: Kcal: 1400-1700 Protein: 80-90 gr Fluid: >2000 ml/day  Skin: No issues noted  Diet Order: NPO  EDUCATION NEEDS: -Education not appropriate at this time  No intake or output data in the 24 hours ending 05/26/13 1030  Last BM:  PTA  Labs:   Recent Labs Lab 05/26/13 0041  NA 138  K 3.2*  CL 99  CO2 30  BUN 12  CREATININE 0.80  CALCIUM 9.9  MG 1.8  GLUCOSE 89     CBG (last 3)  No results found for this basename: GLUCAP,  in the last 72 hours  Scheduled Meds: . citalopram  20 mg Oral Daily  . dicyclomine  10 mg Oral TID AC  . gabapentin  600 mg Oral TID  . naloxone      . sodium chloride  3 mL Intravenous Q12H    Continuous Infusions: . 0.9 % NaCl with KCl 40 mEq / L 125 mL/hr at 05/26/13 0945    Past Medical History  Diagnosis Date  . Elevated liver enzymes   . Diarrhea   . IBS (irritable bowel syndrome) 09/04/2011  . Left leg pain     related to L4-L5 ruptured disc  . Depression   . Anxiety     Past Surgical History  Procedure Laterality Date  . Total abdominal hysterectomy    . Cholecystectomy    . Back surgery    . Breast lumpectomy      benign  . Colonoscopy  09/05/2011    Procedure: COLONOSCOPY;  Surgeon: Malissa Hippo, MD;  Location: [facility];  Service: Endoscopy;  Laterality: N/A;    Royann Shivers MS,RD,LDN,CSG Office: [phone] Pager: [phone]

## 2013-05-26 NOTE — ED Notes (Signed)
Patient c/o increased nausea after returning from CT scan.

## 2013-05-27 DIAGNOSIS — R7989 Other specified abnormal findings of blood chemistry: Secondary | ICD-10-CM

## 2013-05-27 DIAGNOSIS — R109 Unspecified abdominal pain: Secondary | ICD-10-CM

## 2013-05-27 LAB — COMPREHENSIVE METABOLIC PANEL
ALT: 948 U/L — ABNORMAL HIGH (ref 0–35)
AST: 621 U/L — ABNORMAL HIGH (ref 0–37)
CO2: 24 mEq/L (ref 19–32)
Chloride: 108 mEq/L (ref 96–112)
GFR calc non Af Amer: >90 mL/min (ref >90)
Potassium: 4.1 mEq/L (ref 3.5–5.1)
Sodium: 139 mEq/L (ref 135–145)
Total Bilirubin: 1.7 mg/dL — ABNORMAL HIGH (ref 0.3–1.2)

## 2013-05-27 LAB — CBC
Hemoglobin: 11.8 g/dL — ABNORMAL LOW (ref 12.0–15.0)
MCH: 31.6 pg (ref 26.0–34.0)
MCHC: 34.3 g/dL (ref 30.0–36.0)
Platelets: 134 10*3/uL — ABNORMAL LOW (ref 150–400)

## 2013-05-27 LAB — CLOSTRIDIUM DIFFICILE BY PCR: Toxigenic C. Difficile by PCR: NEGATIVE

## 2013-05-27 NOTE — Progress Notes (Signed)
+  PT TRANSFERRING TO ROOM 333. PT ALERT AND ORIENTED. NO FURTHER LETHARGY.STEADY GAIT. RT FA NSL PATENT. NO NAUSEA OR VOMITING SINCE ADMISSION. TRANSFER REPORT CALLED TO SARA RN ON 300.

## 2013-05-27 NOTE — Progress Notes (Signed)
[facility] PROGRESS NOTE  JNIYA MADARA ZOX:[id number] DOB: [birth year] DOA: 05/25/2013 PCP: Ernestine Conrad, MD  Assessment/Plan: 1. Nausea, vomiting, diarrhea with associated epigastric and generalized abdominal pain: Overall improved. Nausea and vomiting have resolved. Abdominal pain resolved. Diarrhea persists, patient attributes to IBS. CT findings chronic and stable. Clear liquid diet, advance as tolerated. Follow up stool studies. GI consultation appreciated. 2. Possible acute gastroenteritis: Appears to be resolving. Leukopenia resolved. 3. Acute encephalopathy: Resolved. Likely secondary to Dilaudid, Phenergan, possibly Xanax. 4. Persistent elevation of serum transaminases: Acute on chronic, increased today. Extensive evaluation in the past as previously documented. Discussed with Dr. Karilyn Cota yesterday, await further recommendations. 5. Dehydration: Appears resolved. 6. Mild thrombocytopenia: Likely related to acute illness. Follow clinically. 7. Hypokalemia: Repleted. 8. History of irritable bowel: Stable. Continue Bentyl. 9. Anxiety, depression: Continue Celexa. Hold off on benzodiazepines for now.   Clear liquid diet, advance as tolerated  Follow serum transaminases  Transfer to medical floor  Possible discharge 7/13  Code Status: Full code DVT prophylaxis: SCDs Family Communication: None present Disposition Plan: Likely home 24 hours.  Brendia Sacks, MD  [facility]  Pager [phone] If 7PM-7AM, please contact night-coverage at [email or web address], password [facility] 05/27/2013, 8:05 AM  LOS: 2 days   Clinical Summary: 47 year old woman presented to the emergency department with two-week history of nausea, vomiting and diarrhea as well as epigastric abdominal pain. Initial evaluation included nonacute CT of the abdomen and pelvis. Laboratory abnormalities appeared to be chronic. Patient was treated with was pain medication and antiemetics and while in the ER she developed  acute encephalopathy and referred for admission for nausea, vomiting, diarrhea, abdominal pain and acute encephalopathy.  Consultants:  GI Dr. Karilyn Cota  Procedures:    HPI/Subjective: Feels much better today. No pain, no abdominal pain. Hungry. Diarrhea persists, she attributes to IBS. She hopes to go home soon.  Objective: Filed Vitals:   05/27/13 0400 05/27/13 0430 05/27/13 0500 05/27/13 0530  BP:  90/56 103/62   Pulse:      Temp:  98.7 F (37.1 C)    TempSrc:  Oral    Resp: 21 11 12 13   Height:      Weight:      SpO2:        Intake/Output Summary (Last 24 hours) at 05/27/13 0805 Last data filed at 05/27/13 0500  Gross per 24 hour  Intake   3620 ml  Output    954 ml  Net   2666 ml     Filed Weights   05/26/13 0952  Weight: 69.7 kg (153 lb 10.6 oz)    Exam:   Afebrile, vital signs stable. 4 stools.  General: Appears calm and comfortable. Speech fluent and clear. Awake and alert.  Cardiovascular: Regular rate and rhythm. No murmur, rub, gallop. No lower extremity edema.  Respiratory: Clear to auscultation bilaterally. No wheezes, rales, rhonchi. Normal respiratory effort.  Abdomen: Soft, nontender, nondistended.  Psychiatric: Grossly normal mood and affect. Speech fluent and appropriate.  Data Reviewed:  Basic metabolic panel unremarkable. Potassium normal 4.1.  Hepatic function panel notable for increased AST and ALT  CBC notable for hemoglobin 11.8. Platelet count 134.  C. difficile PCR negative  Pending studies:   GI pathogen panel  Stool culture  Fecal lactoferrin  Scheduled Meds: . citalopram  20 mg Oral Daily  . dicyclomine  10 mg Oral TID AC  . gabapentin  600 mg Oral TID  . sodium chloride  3 mL Intravenous Q12H   Continuous  Infusions: . 0.9 % NaCl with KCl 40 mEq / L 125 mL/hr at 05/27/13 0500    Principal Problem:   Nausea and vomiting Active Problems:   Diarrhea   Abdominal pain   Dehydration   LFT elevation    Anxiety   IBS (irritable bowel syndrome)   Gastroenteritis   Acute encephalopathy   Time spent 15 minutes

## 2013-05-27 NOTE — Progress Notes (Signed)
Subjective; Patient feels much better. She has no complaints. She denies nausea vomiting or abdominal pain. She is hungry.  Objective; BP 111/73  Pulse 82  Temp(Src) 98.7 F (37.1 C) (Oral)  Resp 16  Ht 5\' 6"  (1.676 m)  Wt 153 lb 10.6 oz (69.7 kg)  BMI 24.81 kg/m2  SpO2 94% Patient is alert and in no acute distress.  Lab data; WBC 7.1, H&H 11.8 and 34.4 and platelet count 134K Bilirubin 1.7, AP 121, AST 621, ALT 1948, total protein 5.1 and albumin 2.9 GI pathogen panel is pending.  Assessment; #1.N/V/D. nausea vomiting has resolved. She had 3 bowel movements yesterday but none this morning. GI pathogen panel is pending. Etiology remains unclear. Given the recurrent nature of the symptoms I doubt that this is an infection. Will check for celiac disease and AIP. #2. Elevated transaminases. Pattern consistent with hepatocellular injury rather than cholestatic pattern. Difficult to blame this and 2 prior episodes an ischemic injury. If transaminases he trending upwards we will consider liver biopsy.  Recommendations; Advance diet to low-residue diet. Celiac antibody panel. Plasma and urinary porphyrins.

## 2013-05-28 LAB — COMPREHENSIVE METABOLIC PANEL
ALT: 713 U/L — ABNORMAL HIGH (ref 0–35)
AST: 231 U/L — ABNORMAL HIGH (ref 0–37)
Alkaline Phosphatase: 150 U/L — ABNORMAL HIGH (ref 39–117)
CO2: 30 mEq/L (ref 19–32)
Calcium: 9.3 mg/dL (ref 8.4–10.5)
Chloride: 106 mEq/L (ref 96–112)
GFR calc Af Amer: >90 mL/min (ref >90)
GFR calc non Af Amer: >90 mL/min (ref >90)
Glucose, Bld: 100 mg/dL — ABNORMAL HIGH (ref 70–99)
Potassium: 3.7 mEq/L (ref 3.5–5.1)
Sodium: 140 mEq/L (ref 135–145)
Total Bilirubin: 0.6 mg/dL (ref 0.3–1.2)

## 2013-05-28 LAB — CBC
MCH: 30.9 pg (ref 26.0–34.0)
MCHC: 33.3 g/dL (ref 30.0–36.0)
RDW: 13.4 % (ref 11.5–15.5)

## 2013-05-28 NOTE — Progress Notes (Signed)
Pt came to floor from ICU. Pt is in NAD. Will continue to monitor.

## 2013-05-28 NOTE — Progress Notes (Signed)
Patient received d/c instructions and verbalizes understanding. IV cath intact and no pain/swelling at site.

## 2013-05-28 NOTE — Discharge Summary (Signed)
Physician Discharge Summary  Shelia Snyder Begin ZOX:[id number] DOB: October 12, 1966 DOA: 05/25/2013  PCP: Shelia Conrad, MD  Admit date: 05/25/2013 Discharge date: 05/28/2013  Recommendations for Outpatient Follow-up:  1. Followup resolution of elevated serum transaminases 2. Followup liver function tests, stool studies, celiac antibody panel, plasma and urinary porphyrins as an outpatient with gastroenterology.  Follow-up Information   Follow up with Shelia U, MD. Schedule an appointment as soon as possible for a visit in 1 week.   Contact information:   [address], [address] [place] [address] [phone]       Follow up with Shelia Conrad, MD In 2 weeks.   Contact information:   [facility] [address] [place] [address] [phone]      Discharge Diagnoses:  1. Nausea, vomiting, diarrhea 2. Epigastric and generalized abdominal pain 3. Possible acute gastroenteritis 4. Acute encephalopathy 5. Persistent elevation of serum transaminases 6. Dehydration 7. History of irritable bowel  Discharge Condition: Improved Disposition: Home  Diet recommendation: Regular  Filed Weights   05/26/13 0952 05/28/13 0458  Weight: 69.7 kg (153 lb 10.6 oz) 74.7 kg (164 lb 10.9 oz)    History of present illness:  47 year old woman presented to the emergency department with two-week history of nausea, vomiting and diarrhea as well as epigastric abdominal pain. Initial evaluation included nonacute CT of the abdomen and pelvis. Laboratory abnormalities appeared to be chronic. Patient was treated with was pain medication and antiemetics and while in the ER she developed acute encephalopathy and referred for admission for nausea, vomiting, diarrhea, abdominal pain and acute encephalopathy.  Hospital Course:  Shelia Snyder was admitted for close monitoring. Acute encephalopathy rapidly resolved most likely secondary to medication effect from Dilaudid and Phenergan. Diarrhea rapidly  improved and nausea and vomiting resolved with supportive care as did abdominal pain. Serum transaminases increased and then now trending downwards. Patient was seen in consultation with gastroenterology. She has had extensive evaluation in the past including multiple MRCP as well as negative hepatitis panels and the etiology of persistent elevation of her serum transaminases is unclear. Given her clinical improvement and the chronicity of her abnormal liver enzymes, she is stable for discharge. She will followup with gastroenterology as an outpatient. Individual issues as below.  Assessment/Plan:  1. Nausea, vomiting, diarrhea with associated epigastric and generalized abdominal pain: Resolved overall, little diarrhea which patient attributes to IBS. CT findings chronic and stable. Tolerating diet. Followup with gastroenterology as an outpatient. 2. Possible acute gastroenteritis: Resolved. Leukopenia resolved. 3. Acute encephalopathy: Resolved. Likely secondary to Dilaudid, Phenergan.. 4. Persistent elevation of serum transaminases: Acute on chronic, now trending downwards. Discussed with Shelia Snyder yesterday--as the studies are trending downwards, plan discharge home. Outpatient gastroenterology followup. 5. Dehydration: A resolved. 6. Mild thrombocytopenia: Likely related to acute illness. Resolved. 7. Hypokalemia: Repleted. 8. History of irritable bowel: Stable. Continue Bentyl. 9. Anxiety, depression: Stable. Continue Celexa.  10. Followup liver function tests, stool studies, celiac antibody panel, plasma and urinary porphyrins as an outpatient with gastroenterology.    Discharge Instructions  Discharge Orders   Future Orders Complete By Expires     Activity as tolerated - No restrictions  As directed     Diet general  As directed     Discharge instructions  As directed     Comments:      Call your physician or seek immediate medical attention for recurrent abdominal pain, nausea,  vomiting or worsening of condition. Follow-up with Shelia Snyder in the  next 1-2 weeks.        Medication List         ALPRAZolam 1 MG tablet  Commonly known as:  XANAX  Take 1 mg by mouth at bedtime as needed. For anxiety     citalopram 20 MG tablet  Commonly known as:  CELEXA  Take 20 mg by mouth daily.     dicyclomine 10 MG capsule  Commonly known as:  BENTYL  TAKE ONE TABLET 3 TIMES DAILY BEFORE BREAKFAST, LUNCH, AND DINNER.     diphenoxylate-atropine 2.5-0.025 MG per tablet  Commonly known as:  LOMOTIL  Take 1 tablet by mouth 4 (four) times daily as needed. For diarrhea     gabapentin 600 MG tablet  Commonly known as:  NEURONTIN  Take 600 mg by mouth 3 (three) times daily.     HYDROcodone-acetaminophen 5-325 MG per tablet  Commonly known as:  NORCO/VICODIN  Take 1 tablet by mouth every 6 (six) hours as needed for pain.       Allergies  Allergen Reactions  . Sulfa Antibiotics     The results of significant diagnostics from this hospitalization (including imaging, microbiology, ancillary and laboratory) are listed below for reference.    Significant Diagnostic Studies: Ct Abdomen Pelvis W Contrast  05/26/2013   *RADIOLOGY REPORT*  Clinical Data: Right upper quadrant pain and diarrhea.  History of common bile ducts disease.  CT ABDOMEN AND PELVIS WITH CONTRAST  Technique:  Multidetector CT imaging of the abdomen and pelvis was performed following the standard protocol during bolus administration of intravenous contrast.  Contrast: 50mL OMNIPAQUE IOHEXOL 300 MG/ML  SOLN, OMNIPAQUE IOHEXOL 300 MG/ML  SOLN  Comparison: CT abdomen and pelvis 11/18/2010.  MRI abdomen 09/02/2011.  Findings: Calcified granuloma in the left lung base.  Lungs are otherwise clear.  Surgical absence of the gallbladder.  Mild bile duct dilatation which is probably postoperative.  Periportal edema is demonstrated as previously.  This is nonspecific and can represent normal variation or may be  associated with inflammatory process.  The pancreas, spleen, adrenal glands, kidneys, abdominal aorta, inferior vena cava, and retroperitoneal lymph nodes are unremarkable.  The stomach and small bowel are not abnormally distended and no wall thickening is appreciated.  Diffusely stool filled colon without distension or wall thickening.  No free air or free fluid in the abdomen.  Abdominal wall musculature appears intact.  Pelvis:  The appendix is normal.  The bladder wall is not thickened.  The uterus is surgically absent.  No abnormal adnexal masses.  No significant pelvic lymphadenopathy.  Stool filled rectosigmoid colon without diverticulitis.  No free or loculated pelvic fluid collections.  Normal alignment of the lumbar vertebrae.  Mild degenerative changes.  IMPRESSION: Nonspecific diffuse periportal edema is stable since previous study.  Surgical absence of the gallbladder.  Diffusely stool filled colon without evidence of obstruction.   Original Report Authenticated By: Burman Nieves, M.D.    Microbiology: Recent Results (from the past 240 hour(s))  MRSA PCR SCREENING     Status: None   Collection Time    05/26/13 11:00 AM      Result Value Range Status   MRSA by PCR NEGATIVE  NEGATIVE Final   Comment:            The GeneXpert MRSA Assay (FDA     approved for NASAL specimens     only), is one component of a     comprehensive MRSA colonization     surveillance  program. It is not     intended to diagnose MRSA     infection nor to guide or     monitor treatment for     MRSA infections.  CLOSTRIDIUM DIFFICILE BY PCR     Status: None   Collection Time    05/26/13 11:21 PM      Result Value Range Status   C difficile by pcr NEGATIVE  NEGATIVE Final     Labs: Basic Metabolic Panel:  Recent Labs Lab 05/26/13 0041 05/27/13 0502 05/28/13 0842  NA 138 139 140  K 3.2* 4.1 3.7  CL 99 108 106  CO2 30 24 30   GLUCOSE 89 70 100*  BUN 12 11 8   CREATININE 0.80 0.65 0.64  CALCIUM 9.9  8.3* 9.3  MG 1.8  --   --    Liver Function Tests:  Recent Labs Lab 05/26/13 0041 05/27/13 0502 05/28/13 0842  AST 235* 621* 231*  ALT 153* 948* 713*  ALKPHOS 81 121* 150*  BILITOT 0.4 1.7* 0.6  PROT 6.9 5.1* 6.4  ALBUMIN 4.3 2.9* 3.4*    Recent Labs Lab 05/26/13 0041  LIPASE 47   CBC:  Recent Labs Lab 05/26/13 0041 05/27/13 0502 05/28/13 0842  WBC 1.5* 7.1 8.2  NEUTROABS 1.3*  --   --   HGB 14.6 11.8* 13.5  HCT 42.8 34.4* 40.6  MCV 91.8 92.2 92.9  PLT 212 134* 164    Principal Problem:   Nausea and vomiting Active Problems:   Diarrhea   Abdominal pain   Dehydration   LFT elevation   Anxiety   IBS (irritable bowel syndrome)   Gastroenteritis   Acute encephalopathy   Time coordinating discharge: 20 minutes  Signed:  Brendia Sacks, MD [facility] 05/28/2013, 10:44 AM

## 2013-05-28 NOTE — Progress Notes (Signed)
Subjective; Patient feels better; no nausea or abdominal pain reported. She had 3 stools yesterday. Stool volume is decreasing in consistencies improving. She denies rectal lesion or melena. She is anxious to go home.  Objective; BP 142/91  Pulse 79  Temp(Src) 97.3 F (36.3 C) (Oral)  Resp 18  Ht 5\' 6"  (1.676 m)  Wt 164 lb 10.9 oz (74.7 kg)  BMI 26.59 kg/m2  SpO2 98% Abdomen is symmetrical with normal bowel sounds. Soft abdomen without tenderness organomegaly or masses. No LE edema noted.  Lab data; WBC 8.2 H&H 13.5 and 40.6 Platelet count 164K Bilirubin 0.6, AP 150, AST 231, ALT 713, albumin 3.4. GI pathogen panel pending. Celiac antibody panel pending. Porphyrin level pending.  Assessment; #1. N/V/D. nausea and vomiting has resolved and diarrhea is improving. Stool studies pending. She had leukopenia on initial presentation ingesting while gastroenteritis; however on day 2 her WBC was normal; therefore one has to question if the first reading was accurate. VBC is normal today as well. #2. Elevated transaminases. AST and ALT are coming down. Once again etiology not clear. This is not a cholestatic pattern. Unless transaminases returned to normal within the next week or two will consider liver biopsy.  Recommendations; Agree with DC plans. She can take dicyclomine and Imodium for symptom control. We will contact patient with results of pending studies and plan on seeing her in the office week after next.

## 2013-05-28 NOTE — Progress Notes (Signed)
[facility] PROGRESS NOTE  Shelia Snyder ZOX:[id number] DOB: 01-Jul-1966 DOA: 05/25/2013 PCP: Ernestine Conrad, MD  Assessment/Plan: 1. Nausea, vomiting, diarrhea with associated epigastric and generalized abdominal pain: Resolved overall, little diarrhea which patient attributes to IBS. CT findings chronic and stable. Tolerating diet. Followup with gastroenterology as an outpatient. 2. Possible acute gastroenteritis: Resolved. Leukopenia resolved. 3. Acute encephalopathy: Resolved. Likely secondary to Dilaudid, Phenergan.. 4. Persistent elevation of serum transaminases: Acute on chronic, now trending downwards. Discussed with Dr. Karilyn Cota yesterday--as the studies are trending downwards, plan discharge home. Outpatient gastroenterology followup. 5. Dehydration: A resolved. 6. Mild thrombocytopenia: Likely related to acute illness. Resolved. 7. Hypokalemia: Repleted. 8. History of irritable bowel: Stable. Continue Bentyl. 9. Anxiety, depression: Stable. Continue Celexa.    Followup liver function tests, stool studies, celiac antibody panel, plasma and urinary porphyrins as an outpatient with gastroenterology.  Home today  Code Status: Full code DVT prophylaxis: SCDs Family Communication: None present Disposition Plan: Home  Brendia Sacks, MD  [facility]  Pager [phone] If 7PM-7AM, please contact night-coverage at [email or web address], password [email or web address] 05/28/2013, 10:37 AM  LOS: 3 days   Clinical Summary: 47 year old woman presented to the emergency department with two-week history of nausea, vomiting and diarrhea as well as epigastric abdominal pain. Initial evaluation included nonacute CT of the abdomen and pelvis. Laboratory abnormalities appeared to be chronic. Patient was treated with was pain medication and antiemetics and while in the ER she developed acute encephalopathy and referred for admission for nausea, vomiting, diarrhea, abdominal pain and acute  encephalopathy.  Consultants:  GI Dr. Karilyn Cota  Procedures:  None  HPI/Subjective: Feels much better. Diarrhea has slowed down. No abdominal pain. No nausea or vomiting. Wants to go home.  Objective: Filed Vitals:   05/27/13 0900 05/27/13 1430 05/27/13 2141 05/28/13 0458  BP: 111/73 103/63 112/73 142/91  Pulse:  68 69 79  Temp:  98.6 F (37 C) 97.5 F (36.4 C) 97.3 F (36.3 C)  TempSrc:  Oral Oral Oral  Resp: 16 18 18 18   Height:      Weight:    74.7 kg (164 lb 10.9 oz)  SpO2:  100% 95% 98%    Intake/Output Summary (Last 24 hours) at 05/28/13 1037 Last data filed at 05/27/13 1800  Gross per 24 hour  Intake    240 ml  Output      0 ml  Net    240 ml     Filed Weights   05/26/13 0952 05/28/13 0458  Weight: 69.7 kg (153 lb 10.6 oz) 74.7 kg (164 lb 10.9 oz)    Exam:   Afebrile, vital signs stable.   General: Appears calm and comfortable. Appears well. Speech fluent and clear.  Cardiovascular: Regular rate and rhythm. No murmur, rub, gallop.   Respiratory: Clear to auscultation bilaterally. No wheezes, rales, rhonchi. Normal respiratory effort.  Abdomen: Soft, nontender, nondistended.  Data Reviewed:  Basic metabolic panel remarkable. Serum transaminases now trending downwards. Total bilirubin now normal.  CBC unremarkable  Pending studies:   GI pathogen panel  Stool culture  Fecal lactoferrin  Scheduled Meds: . citalopram  20 mg Oral Daily  . dicyclomine  10 mg Oral TID AC  . gabapentin  600 mg Oral TID  . sodium chloride  3 mL Intravenous Q12H   Continuous Infusions:    Principal Problem:   Nausea and vomiting Active Problems:   Diarrhea   Abdominal pain   Dehydration   LFT elevation   Anxiety   IBS (irritable  bowel syndrome)   Gastroenteritis   Acute encephalopathy

## 2013-05-29 ENCOUNTER — Telehealth (INDEPENDENT_AMBULATORY_CARE_PROVIDER_SITE_OTHER): Payer: Self-pay | Admitting: *Deleted

## 2013-05-29 LAB — GLIADIN ANTIBODIES, SERUM
Gliadin IgA: 5.8 U/mL (ref <20)
Gliadin IgG: 2.3 U/mL (ref <20)

## 2013-05-29 NOTE — Telephone Encounter (Signed)
Lab order faxed to the lab at [facility].

## 2013-05-29 NOTE — Progress Notes (Signed)
UR chart review completed.  

## 2013-05-29 NOTE — Telephone Encounter (Signed)
Dr. Karilyn Cota is needing Shelia Snyder to have lab work done before her apt next week on 06/06/13. She is wanting to go to [facility]. Patient's return phone number is needed is [phone].

## 2013-05-30 LAB — GI PATHOGEN PANEL BY PCR, STOOL
Campylobacter by PCR: NEGATIVE
Cryptosporidium by PCR: NEGATIVE
E coli (ETEC) LT/ST: NEGATIVE
E coli 0157 by PCR: NEGATIVE
Shigella by PCR: NEGATIVE

## 2013-05-31 LAB — STOOL CULTURE

## 2013-06-06 ENCOUNTER — Encounter (INDEPENDENT_AMBULATORY_CARE_PROVIDER_SITE_OTHER): Payer: Self-pay | Admitting: Internal Medicine

## 2013-06-06 ENCOUNTER — Ambulatory Visit (INDEPENDENT_AMBULATORY_CARE_PROVIDER_SITE_OTHER): Payer: BC Managed Care – PPO | Admitting: Internal Medicine

## 2013-06-06 VITALS — BP 100/70 | HR 76 | Temp 97.6°F | Ht 66.0 in | Wt 143.2 lb

## 2013-06-06 DIAGNOSIS — R112 Nausea with vomiting, unspecified: Secondary | ICD-10-CM

## 2013-06-06 DIAGNOSIS — R748 Abnormal levels of other serum enzymes: Secondary | ICD-10-CM

## 2013-06-06 DIAGNOSIS — R197 Diarrhea, unspecified: Secondary | ICD-10-CM

## 2013-06-06 LAB — COMPREHENSIVE METABOLIC PANEL
AST: 23 U/L (ref 0–37)
Albumin: 3.9 g/dL (ref 3.5–5.2)
Alkaline Phosphatase: 108 U/L (ref 39–117)
Potassium: 5 mEq/L (ref 3.5–5.3)
Sodium: 136 mEq/L (ref 135–145)
Total Bilirubin: 0.6 mg/dL (ref 0.3–1.2)
Total Protein: 6.7 g/dL (ref 6.0–8.3)

## 2013-06-06 LAB — PORPHYRINS, FRACTIONATION-PLASMA

## 2013-06-06 MED ORDER — DIPHENOXYLATE-ATROPINE 2.5-0.025 MG PO TABS: 1.0000 | ORAL_TABLET | Freq: Four times a day (QID) | ORAL | Status: DC | PRN

## 2013-06-06 NOTE — Progress Notes (Addendum)
Subjective:     Patient ID: Shelia Snyder, female   DOB: 29-Jul-1966, 47 y.o.   MRN: [id number]  HPI Here today for f/u after a recent admission to hospital for N,V, and diarrhea. She apparently had been sick for over 2 months with nausea,vomiting, and diarrhea. She also had a low grade fever associated with her fever. She had mid rt abdominal pain radiating into her back.  She had not been on any recent antibiotics. Noted on admission her transaminases were elevated. She has lost 10 pounds since her admission to hospital (May 25, 2009). She tells me she is eating. She is on a bland diet. She tells me she has diarrhea or is vomiting. She is eating rice crackers.She is eating 3 meals a day.  She is having 6 loose stools a day with the Bentyl. She says her stools are not formed. She c/o rt upper quadrant pain. She tells me she had a syncopal episode Sunday after vomiting.  She does c/o itching but no rash. She is taking Benadryl for the itching.   05/2013 GI pathogen negative. C difficile negative. CMP     Component Value Date/Time   NA 140 05/28/2013 0842   K 3.7 05/28/2013 0842   CL 106 05/28/2013 0842   CO2 30 05/28/2013 0842   GLUCOSE 100* 05/28/2013 0842   BUN 8 05/28/2013 0842   CREATININE 0.64 05/28/2013 0842   CALCIUM 9.3 05/28/2013 0842   PROT 6.4 05/28/2013 0842   ALBUMIN 3.4* 05/28/2013 0842   AST 231* 05/28/2013 0842   ALT 713* 05/28/2013 0842   ALKPHOS 150* 05/28/2013 0842   BILITOT 0.6 05/28/2013 0842   GFRNONAA >90 05/28/2013 0842   GFRAA >90 05/28/2013 0842   06/02/2013 ALP 116, AST 33, ALT 177.  CBC    Component Value Date/Time   WBC 8.2 05/28/2013 0842   RBC 4.37 05/28/2013 0842   HGB 13.5 05/28/2013 0842   HCT 40.6 05/28/2013 0842   PLT 164 05/28/2013 0842   MCV 92.9 05/28/2013 0842   MCH 30.9 05/28/2013 0842   MCHC 33.3 05/28/2013 0842   RDW 13.4 05/28/2013 0842   LYMPHSABS 0.2* 05/26/2013 0041   MONOABS 0.0* 05/26/2013 0041   EOSABS 0.0 05/26/2013 0041   BASOSABS 0.0 05/26/2013  0041     Tylenol level was normal. Hx of elevated tranaminases and has undergone 2 ERCP in the past (see below).      07/01/09 ERCP Dr. Karilyn Cota [facility]: for sudden onset of rt upper quadrant pain, elevated transaminases, fever, and chills and suspected to have cholangitis.  Final diagnosis: Mildly dilated biliary system without filling defects or stones or stricture. Short cystic duct remnant without filling defects. Biliary sphincterotomy was slightly extended and stone balloon extractor passed across the duct and ampulla. It is likely she passed a stone again.  October 2005: ERCP with spincterotomy and [facility]: No stones were found and it was felt that she must have passed it spontaneously.  Status post cholecystectomy 20 over 20 yrs ago.  Acute Hepatitis Panel has been negative in the past (2012)   09/01/2011 Colonoscopy for chronic diarrhea. Impression:  Normal terminal ileoscopy.  Normal colonoscopy.  Random biopsies taken from mucosa of sigmoid colon looking for collagenous or microscopic colitis.      Review of Systems see hpi Current Outpatient Prescriptions  Medication Sig Dispense Refill  . ALPRAZolam (XANAX) 1 MG tablet Take 1 mg by mouth at bedtime as needed. For anxiety      .  citalopram (CELEXA) 20 MG tablet Take 20 mg by mouth daily.      Marland Kitchen dicyclomine (BENTYL) 10 MG capsule 10 mg 4 (four) times daily. TAKE ONE TABLET 3 TIMES DAILY BEFORE BREAKFAST, LUNCH, AND DINNER.      Marland Kitchen gabapentin (NEURONTIN) 600 MG tablet Take 600 mg by mouth 3 (three) times daily.        Marland Kitchen HYDROcodone-acetaminophen (NORCO/VICODIN) 5-325 MG per tablet Take 1 tablet by mouth every 6 (six) hours as needed for pain.      . diphenoxylate-atropine (LOMOTIL) 2.5-0.025 MG per tablet Take 1 tablet by mouth 4 (four) times daily as needed. For diarrhea  30 tablet  0   No current facility-administered medications for this visit.   Past Medical History  Diagnosis Date  . Elevated liver enzymes   . Diarrhea   .  IBS (irritable bowel syndrome) 09/04/2011  . Left leg pain     related to L4-L5 ruptured disc  . Depression   . Anxiety    Past Surgical History  Procedure Laterality Date  . Total abdominal hysterectomy    . Cholecystectomy    . Back surgery    . Breast lumpectomy      benign  . Colonoscopy  09/05/2011    Procedure: COLONOSCOPY;  Surgeon: Malissa Hippo, MD;  Location: [facility];  Service: Endoscopy;  Laterality: N/A;   Allergies  Allergen Reactions  . Sulfa Antibiotics         Objective:   Physical Exam  Filed Vitals:   06/06/13 1411  BP: 100/70  Pulse: 76  Temp: 97.6 F (36.4 C)  Height: 5\' 6"  (1.676 m)  Weight: 143 lb 3.2 oz (64.955 kg)   Alert and oriented. Skin warm and dry. Oral mucosa is moist.   . Sclera anicteric, conjunctivae is pink. Thyroid not enlarged. No cervical lymphadenopathy. Lungs clear. Heart regular rate and rhythm.  Abdomen is soft. Bowel sounds are positive. No hepatomegaly. No abdominal masses felt. Rt upper quadrant tenderness.  No edema to lower extremities.       Assessment:    Elvated liver enzymes. ? Etiology. I discussed this case with Dr. Karilyn Cota.    Plan:    Next episode of elevated liver enzymes, nausea, vomiting, or diarrhea. Cmet in 4 weeks.  Cmet today, Tylenol level today.  Will get a liver biopsy. Sulfit free diet.

## 2013-06-06 NOTE — Patient Instructions (Addendum)
OV in 2 months. If your symptoms return, we are going to do a liver biopsy.

## 2013-06-07 ENCOUNTER — Telehealth (INDEPENDENT_AMBULATORY_CARE_PROVIDER_SITE_OTHER): Payer: Self-pay | Admitting: *Deleted

## 2013-06-07 DIAGNOSIS — R197 Diarrhea, unspecified: Secondary | ICD-10-CM

## 2013-06-07 LAB — IRON: Iron: 130 ug/dL (ref 42–145)

## 2013-06-07 LAB — PORPHYRINS, FRACTIONATED, RANDOM URINE
Coproporphyrin III (PORFRU): 2.7 mcg/g creat — ABNORMAL LOW (ref 4.1–76.4)
Total Porphyrins, random ur: 195 mcg/g creat — ABNORMAL HIGH (ref 23.3–132.4)
Uroporphyrin, random ur: 14.9 mcg/g creat (ref 3.1–18.2)

## 2013-06-07 NOTE — Telephone Encounter (Signed)
.  Per Terri Setzer,NP patient to have labs in 4 weeks. 

## 2013-06-15 ENCOUNTER — Other Ambulatory Visit (INDEPENDENT_AMBULATORY_CARE_PROVIDER_SITE_OTHER): Payer: Self-pay | Admitting: *Deleted

## 2013-06-15 ENCOUNTER — Encounter (INDEPENDENT_AMBULATORY_CARE_PROVIDER_SITE_OTHER): Payer: Self-pay | Admitting: *Deleted

## 2013-06-15 DIAGNOSIS — R197 Diarrhea, unspecified: Secondary | ICD-10-CM

## 2013-07-03 ENCOUNTER — Encounter (INDEPENDENT_AMBULATORY_CARE_PROVIDER_SITE_OTHER): Payer: Self-pay

## 2013-08-07 ENCOUNTER — Ambulatory Visit (INDEPENDENT_AMBULATORY_CARE_PROVIDER_SITE_OTHER): Payer: BC Managed Care – PPO | Admitting: Internal Medicine

## 2014-11-16 HISTORY — PX: COLONOSCOPY: SHX174

## 2015-04-12 LAB — HM COLONOSCOPY

## 2015-04-12 LAB — COLOGUARD: Cologuard: NEGATIVE

## 2016-12-29 ENCOUNTER — Ambulatory Visit (INDEPENDENT_AMBULATORY_CARE_PROVIDER_SITE_OTHER): Payer: 59

## 2016-12-29 ENCOUNTER — Encounter: Payer: Self-pay | Admitting: Orthopaedic Surgery

## 2016-12-29 ENCOUNTER — Ambulatory Visit (INDEPENDENT_AMBULATORY_CARE_PROVIDER_SITE_OTHER): Payer: 59 | Admitting: Orthopaedic Surgery

## 2016-12-29 VITALS — BP 114/72 | HR 93 | Temp 97.7°F | Ht 65.5 in | Wt 119.0 lb

## 2016-12-29 DIAGNOSIS — M25512 Pain in left shoulder: Secondary | ICD-10-CM

## 2016-12-29 MED ORDER — NAPROXEN 500 MG PO TABS: 500.0000 mg | ORAL_TABLET | Freq: Two times a day (BID) | ORAL | 5 refills | Status: DC

## 2016-12-29 NOTE — Progress Notes (Signed)
Subjective:    Patient ID: Shelia Snyder, female    DOB: [birth year], 51 y.o.   MRN: [id number]  HPI She has had pain in the left shoulder for about three weeks or so.  She has a wood pellet heating system.  She was lifting a 40 pound bag of wood pellets and felt a pop in her shoulder.  She had no numbness, no redness. No other trauma is present.  She has taken Aleve, and used heat. She cannot stand ice.  She cannot sleep well as it wakes her up when she rolls over on the left shoulder.  She is on pain medicine from her family doctor.  Review of Systems  HENT: Negative for congestion.   Respiratory: Negative for cough and shortness of breath.   Cardiovascular: Negative for chest pain and leg swelling.  Endocrine: Positive for cold intolerance.  Musculoskeletal: Positive for arthralgias.  Allergic/Immunologic: Positive for environmental allergies.  Psychiatric/Behavioral: The patient is nervous/anxious.    Past Medical History:  Diagnosis Date  . Anxiety   . Depression   . Diarrhea   . Elevated liver enzymes   . IBS (irritable bowel syndrome) 09/04/2011  . Left leg pain    related to L4-L5 ruptured disc    Past Surgical History:  Procedure Laterality Date  . BACK SURGERY    . BREAST LUMPECTOMY     benign  . CHOLECYSTECTOMY    . COLONOSCOPY  09/05/2011   Procedure: COLONOSCOPY;  Surgeon: Malissa HippoNajeeb U Rehman, MD;  Location: [facility];  Service: Endoscopy;  Laterality: N/A;  . TOTAL ABDOMINAL HYSTERECTOMY      Current Outpatient Prescriptions on File Prior to Visit  Medication Sig Dispense Refill  . ALPRAZolam (XANAX) 1 MG tablet Take 1 mg by mouth at bedtime as needed. For anxiety    . citalopram (CELEXA) 20 MG tablet Take 20 mg by mouth daily.    Marland Kitchen. dicyclomine (BENTYL) 10 MG capsule 10 mg 4 (four) times daily. TAKE ONE TABLET 3 TIMES DAILY BEFORE BREAKFAST, LUNCH, AND DINNER.    Marland Kitchen. diphenoxylate-atropine (LOMOTIL) 2.5-0.025 MG per tablet Take 1 tablet by mouth 4 (four) times  daily as needed. For diarrhea 60 tablet 0  . gabapentin (NEURONTIN) 600 MG tablet Take 600 mg by mouth 3 (three) times daily.       No current facility-administered medications on file prior to visit.     Social History   Social History  . Marital status: Married    Spouse name: N/A  . Number of children: N/A  . Years of education: N/A   Occupational History  . Not on file.   Social History Main Topics  . Smoking status: Former Smoker    Packs/day: 1.00    Years: 19.00  . Smokeless tobacco: Never Used  . Alcohol use No  . Drug use: No  . Sexual activity: Yes    Birth control/ protection: None   Other Topics Concern  . Not on file   Social History Narrative  . No narrative on file    Family History  Problem Relation Age of Onset  . Colon cancer Other   . Heart attack Mother     BP 114/72   Pulse 93   Temp 97.7 F (36.5 C)   Ht 5' 5.5" (1.664 m)   Wt 119 lb (54 kg)   BMI 19.50 kg/m      Objective:   Physical Exam  Constitutional: She is oriented to person, place,  and time. She appears well-developed and well-nourished.  HENT:  Head: Normocephalic and atraumatic.  Eyes: Conjunctivae and EOM are normal. Pupils are equal, round, and reactive to light.  Neck: Normal range of motion. Neck supple.  Cardiovascular: Normal rate, regular rhythm and intact distal pulses.   Pulmonary/Chest: Effort normal.  Abdominal: Soft.  Musculoskeletal: She exhibits tenderness (Left shoulder painful.  NV intact. ROM neck full, shoulder forward motion 120, abduction 90, internal 25, external 25, extension 10, adduction 25.   Grips normal.  Right shoulder negative.).  Neurological: She is alert and oriented to person, place, and time. She displays normal reflexes. No cranial nerve deficit. She exhibits normal muscle tone. Coordination normal.  Skin: Skin is warm and dry.  Psychiatric: She has a normal mood and affect. Her behavior is normal. Judgment and thought content normal.   Vitals reviewed.  X-rays were done of the left shoulder, reported separately.       Assessment & Plan:   Encounter Diagnosis  Name Primary?  . Pain in joint of left shoulder Yes   PROCEDURE NOTE:  The patient request injection, verbal consent was obtained.  The left shoulder was prepped appropriately after time out was performed.   Sterile technique was observed and injection of 1 cc of Depo-Medrol 40 mg with several cc's of plain xylocaine. Anesthesia was provided by ethyl chloride and a 20-gauge needle was used to inject the shoulder area. A posterior approach was used.  The injection was tolerated well.  A band aid dressing was applied.  The patient was advised to apply ice later today and tomorrow to the injection sight as needed.  I have given Rx for Naprosyn.  She is to continue her pain medicine.  Return in two weeks.  Consider exercises.  She does not want to go to PT as she had a bad experience there in the past.  She may need MRI.  Call if any problem.  Precautions discussed.  Electronically Signed Darreld Mclean, MD 2/13/20188:44 PM

## 2017-01-12 ENCOUNTER — Ambulatory Visit (INDEPENDENT_AMBULATORY_CARE_PROVIDER_SITE_OTHER): Payer: 59 | Admitting: Orthopaedic Surgery

## 2017-01-12 VITALS — BP 112/73 | HR 81 | Temp 97.9°F | Ht 64.5 in | Wt 115.0 lb

## 2017-01-12 DIAGNOSIS — M25512 Pain in left shoulder: Secondary | ICD-10-CM | POA: Diagnosis not present

## 2017-01-12 NOTE — Progress Notes (Signed)
Patient ID:Shelia Snyder, female DOB:1966/08/02, 51 y.o. ZOX:[id number]  Chief Complaint  Patient presents with  . Follow-up    Left shoulder pain    HPI  Shelia Snyder is a 51 y.o. female who has pain of the left shoulder not getting any better.  I injected the shoulder last time and began Naprosyn.  She is not improved.  She is in fact worse.  She has more pain with motion of the left shoulder especially overhead and with extension.  She has no numbness and no redness. HPI  Body mass index is 19.43 kg/m.  ROS  Review of Systems  HENT: Negative for congestion.   Respiratory: Negative for cough and shortness of breath.   Cardiovascular: Negative for chest pain and leg swelling.  Endocrine: Positive for cold intolerance.  Musculoskeletal: Positive for arthralgias.  Allergic/Immunologic: Positive for environmental allergies.  Psychiatric/Behavioral: The patient is nervous/anxious.     Past Medical History:  Diagnosis Date  . Anxiety   . Depression   . Diarrhea   . Elevated liver enzymes   . IBS (irritable bowel syndrome) 09/04/2011  . Left leg pain    related to L4-L5 ruptured disc    Past Surgical History:  Procedure Laterality Date  . BACK SURGERY    . BREAST LUMPECTOMY     benign  . CHOLECYSTECTOMY    . COLONOSCOPY  09/05/2011   Procedure: COLONOSCOPY;  Surgeon: Malissa Hippo, MD;  Location: [facility];  Service: Endoscopy;  Laterality: N/A;  . TOTAL ABDOMINAL HYSTERECTOMY      Family History  Problem Relation Age of Onset  . Colon cancer Other   . Heart attack Mother     Social History Social History  Substance Use Topics  . Smoking status: Former Smoker    Packs/day: 1.00    Years: 19.00  . Smokeless tobacco: Never Used  . Alcohol use No    Allergies  Allergen Reactions  . Sulfa Antibiotics     Current Outpatient Prescriptions  Medication Sig Dispense Refill  . ALPRAZolam (XANAX) 1 MG tablet Take 1 mg by mouth at bedtime as needed. For  anxiety    . citalopram (CELEXA) 20 MG tablet Take 20 mg by mouth daily.    Marland Kitchen dicyclomine (BENTYL) 10 MG capsule 10 mg 4 (four) times daily. TAKE ONE TABLET 3 TIMES DAILY BEFORE BREAKFAST, LUNCH, AND DINNER.    Marland Kitchen diphenoxylate-atropine (LOMOTIL) 2.5-0.025 MG per tablet Take 1 tablet by mouth 4 (four) times daily as needed. For diarrhea 60 tablet 0  . gabapentin (NEURONTIN) 600 MG tablet Take 600 mg by mouth 3 (three) times daily.      . naproxen (NAPROSYN) 500 MG tablet Take 1 tablet (500 mg total) by mouth 2 (two) times daily with a meal. 60 tablet 5   No current facility-administered medications for this visit.      Physical Exam  Blood pressure 112/73, pulse 81, temperature 97.9 F (36.6 C), height 5' 4.5" (1.638 m), weight 115 lb (52.2 kg).  Constitutional: overall normal hygiene, normal nutrition, well developed, normal grooming, normal body habitus. Assistive device:none  Musculoskeletal: gait and station Limp none, muscle tone and strength are normal, no tremors or atrophy is present.  .  Neurological: coordination overall normal.  Deep tendon reflex/nerve stretch intact.  Sensation normal.  Cranial nerves II-XII intact.   Skin:   Normal overall no scars, lesions, ulcers or rashes. No psoriasis.  Psychiatric: Alert and oriented x 3.  Recent memory  intact, remote memory unclear.  Normal mood and affect. Well groomed.  Good eye contact.  Cardiovascular: overall no swelling, no varicosities, no edema bilaterally, normal temperatures of the legs and arms, no clubbing, cyanosis and good capillary refill.  Lymphatic: palpation is normal.  Examination of left Upper Extremity is done.  Inspection:   Overall:  Elbow non-tender without crepitus or defects, forearm non-tender without crepitus or defects, wrist non-tender without crepitus or defects, hand non-tender.    Shoulder: with glenohumeral joint tenderness, without effusion.   Upper arm: without swelling and tenderness   Range  of motion:   Overall:  Full range of motion of the elbow, full range of motion of wrist and full range of motion in fingers.   Shoulder:  left  125 degrees forward flexion; 90 degrees abduction; 20 degrees internal rotation, 20 degrees external rotation, 10 degrees extension, 35 degrees adduction.   Stability:   Overall:  Shoulder, elbow and wrist stable   Strength and Tone:   Overall full shoulder muscles strength, full upper arm strength and normal upper arm bulk and tone.   The patient has been educated about the nature of the problem(s) and counseled on treatment options.  The patient appeared to understand what I have discussed and is in agreement with it.  Encounter Diagnosis  Name Primary?  . Pain in joint of left shoulder Yes    PLAN Call if any problems.  Precautions discussed.  Continue current medications.   Return to clinic get MRI of the left shoulder   Electronically Signed Darreld McleanWayne Macaiah Mangal, MD 2/27/201810:46 AM

## 2017-01-15 ENCOUNTER — Ambulatory Visit (HOSPITAL_COMMUNITY)
Admission: RE | Admit: 2017-01-15 | Discharge: 2017-01-15 | Disposition: A | Discharge status: Home / Self Care | Payer: 59 | Source: Ambulatory Visit | Attending: Orthopaedic Surgery | Admitting: Orthopaedic Surgery

## 2017-01-15 DIAGNOSIS — M7592 Shoulder lesion, unspecified, left shoulder: Secondary | ICD-10-CM | POA: Diagnosis not present

## 2017-01-15 DIAGNOSIS — M25512 Pain in left shoulder: Secondary | ICD-10-CM | POA: Insufficient documentation

## 2017-01-27 ENCOUNTER — Ambulatory Visit (INDEPENDENT_AMBULATORY_CARE_PROVIDER_SITE_OTHER): Payer: 59 | Admitting: Orthopaedic Surgery

## 2017-01-27 VITALS — BP 116/75 | HR 79 | Temp 97.7°F | Ht 64.5 in | Wt 116.0 lb

## 2017-01-27 DIAGNOSIS — M25512 Pain in left shoulder: Secondary | ICD-10-CM

## 2017-01-27 NOTE — Patient Instructions (Signed)
Begin PT. 

## 2017-01-27 NOTE — Progress Notes (Signed)
Patient ID:Shelia Snyder, female DOB:[birth year], 51 y.o. ZOX:[id number]  Chief Complaint  Patient presents with  . Results    MRI Left Shoulder    HPI  Shelia Snyder is a 51 y.o. female who has had left shoulder pain.  She had the MRI done and it shows: IMPRESSION: Mild rotator cuff tendinopathy is most notable in the subscapularis. No tear. The study is otherwise negative.  I have explained the findings to her.  I will begin PT/OT for her. HPI  Body mass index is 19.6 kg/m.  ROS  Review of Systems  HENT: Negative for congestion.   Respiratory: Negative for cough and shortness of breath.   Cardiovascular: Negative for chest pain and leg swelling.  Endocrine: Positive for cold intolerance.  Musculoskeletal: Positive for arthralgias.  Allergic/Immunologic: Positive for environmental allergies.  Psychiatric/Behavioral: The patient is nervous/anxious.     Past Medical History:  Diagnosis Date  . Anxiety   . Depression   . Diarrhea   . Elevated liver enzymes   . IBS (irritable bowel syndrome) 09/04/2011  . Left leg pain    related to L4-L5 ruptured disc    Past Surgical History:  Procedure Laterality Date  . BACK SURGERY    . BREAST LUMPECTOMY     benign  . CHOLECYSTECTOMY    . COLONOSCOPY  09/05/2011   Procedure: COLONOSCOPY;  Surgeon: Malissa HippoNajeeb U Rehman, MD;  Location: [facility];  Service: Endoscopy;  Laterality: N/A;  . TOTAL ABDOMINAL HYSTERECTOMY      Family History  Problem Relation Age of Onset  . Colon cancer Other   . Heart attack Mother     Social History Social History  Substance Use Topics  . Smoking status: Former Smoker    Packs/day: 1.00    Years: 19.00  . Smokeless tobacco: Never Used  . Alcohol use No    Allergies  Allergen Reactions  . Sulfa Antibiotics     Current Outpatient Prescriptions  Medication Sig Dispense Refill  . ALPRAZolam (XANAX) 1 MG tablet Take 1 mg by mouth at bedtime as needed. For anxiety    . citalopram  (CELEXA) 20 MG tablet Take 20 mg by mouth daily.    Marland Kitchen. dicyclomine (BENTYL) 10 MG capsule 10 mg 4 (four) times daily. TAKE ONE TABLET 3 TIMES DAILY BEFORE BREAKFAST, LUNCH, AND DINNER.    Marland Kitchen. diphenoxylate-atropine (LOMOTIL) 2.5-0.025 MG per tablet Take 1 tablet by mouth 4 (four) times daily as needed. For diarrhea 60 tablet 0  . gabapentin (NEURONTIN) 600 MG tablet Take 600 mg by mouth 3 (three) times daily.      . naproxen (NAPROSYN) 500 MG tablet Take 1 tablet (500 mg total) by mouth 2 (two) times daily with a meal. 60 tablet 5   No current facility-administered medications for this visit.      Physical Exam  Blood pressure 116/75, pulse 79, temperature 97.7 F (36.5 C), height 5' 4.5" (1.638 m), weight 116 lb (52.6 kg).  Constitutional: overall normal hygiene, normal nutrition, well developed, normal grooming, normal body habitus. Assistive device:none  Musculoskeletal: gait and station Limp none, muscle tone and strength are normal, no tremors or atrophy is present.  .  Neurological: coordination overall normal.  Deep tendon reflex/nerve stretch intact.  Sensation normal.  Cranial nerves II-XII intact.   Skin:   Normal overall no scars, lesions, ulcers or rashes. No psoriasis.  Psychiatric: Alert and oriented x 3.  Recent memory intact, remote memory unclear.  Normal mood and  affect. Well groomed.  Good eye contact.  Cardiovascular: overall no swelling, no varicosities, no edema bilaterally, normal temperatures of the legs and arms, no clubbing, cyanosis and good capillary refill.  Lymphatic: palpation is normal.  Examination of left Upper Extremity is done.  Inspection:   Overall:  Elbow non-tender without crepitus or defects, forearm non-tender without crepitus or defects, wrist non-tender without crepitus or defects, hand non-tender.    Shoulder: with glenohumeral joint tenderness, without effusion.   Upper arm: without swelling and tenderness   Range of motion:   Overall:   Full range of motion of the elbow, full range of motion of wrist and full range of motion in fingers.   Shoulder:  left  165 degrees forward flexion; 145 degrees abduction; 35 degrees internal rotation, 35 degrees external rotation, 15 degrees extension, 40 degrees adduction.   Stability:   Overall:  Shoulder, elbow and wrist stable   Strength and Tone:   Overall full shoulder muscles strength, full upper arm strength and normal upper arm bulk and tone.   The patient has been educated about the nature of the problem(s) and counseled on treatment options.  The patient appeared to understand what I have discussed and is in agreement with it.  Encounter Diagnosis  Name Primary?  . Pain in joint of left shoulder Yes    PLAN Call if any problems.  Precautions discussed.  Continue current medications.   Return to clinic 3 weeks   Begin OT.  Electronically Signed Darreld Mclean, MD 3/14/201810:18 AM

## 2017-06-15 ENCOUNTER — Ambulatory Visit (INDEPENDENT_AMBULATORY_CARE_PROVIDER_SITE_OTHER): Payer: 59 | Admitting: Medical

## 2017-06-15 ENCOUNTER — Encounter: Payer: Self-pay | Admitting: Medical

## 2017-06-15 VITALS — BP 110/70 | HR 85 | Wt 119.6 lb

## 2017-06-15 DIAGNOSIS — F419 Anxiety disorder, unspecified: Secondary | ICD-10-CM | POA: Diagnosis not present

## 2017-06-15 DIAGNOSIS — R5383 Other fatigue: Secondary | ICD-10-CM | POA: Diagnosis not present

## 2017-06-15 DIAGNOSIS — G8929 Other chronic pain: Secondary | ICD-10-CM

## 2017-06-15 DIAGNOSIS — R232 Flushing: Secondary | ICD-10-CM | POA: Diagnosis not present

## 2017-06-15 DIAGNOSIS — F329 Major depressive disorder, single episode, unspecified: Secondary | ICD-10-CM

## 2017-06-15 DIAGNOSIS — R6889 Other general symptoms and signs: Secondary | ICD-10-CM | POA: Diagnosis not present

## 2017-06-15 DIAGNOSIS — K580 Irritable bowel syndrome with diarrhea: Secondary | ICD-10-CM | POA: Diagnosis not present

## 2017-06-15 DIAGNOSIS — R4589 Other symptoms and signs involving emotional state: Secondary | ICD-10-CM

## 2017-06-15 DIAGNOSIS — G4700 Insomnia, unspecified: Secondary | ICD-10-CM | POA: Diagnosis not present

## 2017-06-15 DIAGNOSIS — M545 Low back pain: Secondary | ICD-10-CM

## 2017-06-15 LAB — CBC
HEMATOCRIT: 43.2 % (ref 35.0–45.0)
Hemoglobin: 14.6 g/dL (ref 11.7–15.5)
MCH: 30.9 pg (ref 27.0–33.0)
MCHC: 33.8 g/dL (ref 32.0–36.0)
MCV: 91.3 fL (ref 80.0–100.0)
MPV: 10.1 fL (ref 7.5–12.5)
Platelets: 238 10*3/uL (ref 140–400)
RBC: 4.73 MIL/uL (ref 3.80–5.10)
RDW: 13.5 % (ref 11.0–15.0)
WBC: 5.1 10*3/uL (ref 4.0–10.5)

## 2017-06-15 LAB — TSH: TSH: 0.87 mIU/L

## 2017-06-15 LAB — T4, FREE: Free T4: 1.2 ng/dL (ref 0.8–1.8)

## 2017-06-15 MED ORDER — DULOXETINE HCL 30 MG PO CPEP: 30.0000 mg | ORAL_CAPSULE | Freq: Every day | ORAL | 2 refills | Status: DC

## 2017-06-15 NOTE — Progress Notes (Signed)
Subjective: Chief Complaint  Patient presents with  . New Patient (Initial Visit)    new pt ,thyroid , having hot flashes    Here to establish care.   [place] use [place]   She has been seeing Dr. Ernestine ConradKirk Bluth in [place] at the same office I was at prior, but he unfortunately passed away recently.  Dr. Margo Commonapper there was temporarily filling her medication but it unwilling to continue her controlled substances for chronic back pain.  Thus, she is looking for another PCP.  She has chronic back pain, hx/o spinal surgery 2012 with Dr. Sandria ManlyLove, and has neuropathy.  She is using Gabapentin TID and Norco 10/325mg  2-3 times daily for many years now.  No prior pain clinic, no recent neurology or neurosurgery consult . She feels she has been stable on this for a long time.  She has hx/o chronic abdominal pain, IBS diarrhea.   She has had extensive GI evaluation, and had colonoscopy last year with Dr. Teena DunkBenson in [place].   No other cause found, so diagnosis is IBS-D.  She notes trials of prior medications for diarrhea, but none helped.  just deals with the diarrhea now.    She has depression, anxiety, and has felt depressed for years. Dr. Loney HeringBluth had put her on Cymbalta 60mg  but it seemed to strong.   She also was on citalopram in the past but neither helped much. Takes xanax daily for panic feeling.  Sometimes her chronic dysphagia and swallowing problems makes her feel like she is choking.  She uses xanax for this at times.    Stressors include husband's heart attack last year and her own chronic health issues   Lately been dealing with hot flashes that began 53mo and they are horrible.   Has goiter, has cold intolerance, brittle nails.  No prior thyroid ultrasound.  S/p partial hysterectomy 51yo ago.    No HRT.  Nonsmoker, no alcohol use.   Last cousneling 3 years ago when mother passed away.  Works part time at [employer] about [employer]   Medications reviewed today: Gabapentin 600mg   TID Alprazolam 1mg  1/2 BID trazodone 1 tablet 50mg  QHS Norco 10/325mg  BID - TID,chronic back pain.    Was on Darvocet in the past been on norco.      Prior to coming here she didn't really feel comfortable with any other PCP in [place]  Sees Dr. Gwynne EdingerMcLoud for gyn  ROS as in subjective   Objective: BP 110/70   Pulse 85   Wt 119 lb 9.6 oz (54.3 kg)   SpO2 97%   BMI 20.21 kg/m   General appearance: alert, no distress, WD/WN, lean white female, somewhat frail appearing HEENT: normocephalic, sclerae anicteric, TMs pearly, nares patent, no discharge or erythema, pharynx normal Oral cavity: MMM, no lesions Neck: supple, no lymphadenopathy, +mild thyromegaly, R>L, possible nodule right sided, no masses Heart: RRR, normal S1, S2, no murmurs Lungs: CTA bilaterally, no wheezes, rhonchi, or rales Abdomen: +bs, soft, non tender, non distended, no masses, no hepatomegaly, no splenomegaly Lumbar surgical scar, otherwise nontender Pulses: 2+ symmetric, upper and lower extremities, normal cap refill Ext: no edema   Assessment: Encounter Diagnoses  Name Primary?  Marland Kitchen. Hot flashes Yes  . Irritable bowel syndrome with diarrhea   . Depressed mood   . Anxiety   . Chronic bilateral low back pain, with sciatica presence unspecified   . Insomnia, unspecified type   . Other fatigue   .  Cold intolerance     Plan: Will request prior PCP records For now, add back Cymbalta but at 30mg  daily, xanax prn, encouraged her to establish with cousneling Labs today and further eval of hot flashes, fatigue, cold intolerance, goiter Hot flashes - most likely due to post menopausal stage.  Hydrate well, avoid spicy foods, and retry on Cymbalta IBS -D: consider newer therapies, gave samples of Viberzi to hold until we call back about labs insomnia- c/t trazodone 50mg  QHS chronic pain - c/t gabapentin.  Advised I would review records, but we typically don't manage chronic pain   Will await record before making  decision on managing this or not.  Her current pill bottle shows #150 tablet quantity on label filled 06/09/17 by Dr. Margo Commonapper, no refill. Xanax 1mg , #30, with 5 rf , done on 06/10/17   Joyce GrossKay was seen today for new patient (initial visit).  Diagnoses and all orders for this visit:  Hot flashes -     Comprehensive metabolic panel -     CBC -     TSH -     T4, free -     VITAMIN D 25 Hydroxy (Vit-D Deficiency, Fractures)  Irritable bowel syndrome with diarrhea -     Comprehensive metabolic panel -     CBC -     TSH -     T4, free -     VITAMIN D 25 Hydroxy (Vit-D Deficiency, Fractures)  Depressed mood -     Comprehensive metabolic panel -     CBC -     TSH -     T4, free -     VITAMIN D 25 Hydroxy (Vit-D Deficiency, Fractures)  Anxiety -     Comprehensive metabolic panel -     CBC -     TSH -     T4, free -     VITAMIN D 25 Hydroxy (Vit-D Deficiency, Fractures)  Chronic bilateral low back pain, with sciatica presence unspecified -     Comprehensive metabolic panel -     CBC -     TSH -     T4, free -     VITAMIN D 25 Hydroxy (Vit-D Deficiency, Fractures)  Insomnia, unspecified type -     Comprehensive metabolic panel -     CBC -     TSH -     T4, free -     VITAMIN D 25 Hydroxy (Vit-D Deficiency, Fractures)  Other fatigue -     Comprehensive metabolic panel -     CBC -     TSH -     T4, free -     VITAMIN D 25 Hydroxy (Vit-D Deficiency, Fractures)  Cold intolerance -     Comprehensive metabolic panel -     CBC -     TSH -     T4, free -     VITAMIN D 25 Hydroxy (Vit-D Deficiency, Fractures)  Other orders -     DULoxetine (CYMBALTA) 30 MG capsule; Take 1 capsule (30 mg total) by mouth  daily.

## 2017-06-16 LAB — COMPREHENSIVE METABOLIC PANEL
ALBUMIN: 4.4 g/dL (ref 3.6–5.1)
ALT: 32 U/L — ABNORMAL HIGH (ref 6–29)
AST: 24 U/L (ref 10–35)
Alkaline Phosphatase: 84 U/L (ref 33–130)
BILIRUBIN TOTAL: 0.7 mg/dL (ref 0.2–1.2)
BUN: 8 mg/dL (ref 7–25)
CALCIUM: 9.5 mg/dL (ref 8.6–10.4)
CHLORIDE: 104 mmol/L (ref 98–110)
CO2: 21 mmol/L (ref 20–31)
CREATININE: 0.64 mg/dL (ref 0.50–1.05)
Glucose, Bld: 70 mg/dL (ref 65–99)
POTASSIUM: 3.9 mmol/L (ref 3.5–5.3)
Sodium: 141 mmol/L (ref 135–146)
TOTAL PROTEIN: 6.6 g/dL (ref 6.1–8.1)

## 2017-06-16 LAB — VITAMIN D 25 HYDROXY (VIT D DEFICIENCY, FRACTURES): Vit D, 25-Hydroxy: 39 ng/mL (ref 30–100)

## 2017-06-28 ENCOUNTER — Telehealth: Payer: Self-pay

## 2017-06-28 NOTE — Telephone Encounter (Signed)
Records placed in your folder for review. /RLB  

## 2017-06-29 ENCOUNTER — Encounter: Payer: Self-pay | Admitting: Medical

## 2017-07-20 ENCOUNTER — Encounter: Payer: Self-pay | Admitting: Medical

## 2017-07-20 ENCOUNTER — Telehealth: Payer: Self-pay | Admitting: Medical

## 2017-07-20 ENCOUNTER — Ambulatory Visit (INDEPENDENT_AMBULATORY_CARE_PROVIDER_SITE_OTHER): Payer: 59 | Admitting: Medical

## 2017-07-20 VITALS — BP 110/68 | HR 85 | Wt 120.2 lb

## 2017-07-20 DIAGNOSIS — R945 Abnormal results of liver function studies: Secondary | ICD-10-CM

## 2017-07-20 DIAGNOSIS — R7989 Other specified abnormal findings of blood chemistry: Secondary | ICD-10-CM | POA: Diagnosis not present

## 2017-07-20 DIAGNOSIS — K589 Irritable bowel syndrome without diarrhea: Secondary | ICD-10-CM

## 2017-07-20 DIAGNOSIS — F419 Anxiety disorder, unspecified: Secondary | ICD-10-CM

## 2017-07-20 DIAGNOSIS — M549 Dorsalgia, unspecified: Secondary | ICD-10-CM | POA: Diagnosis not present

## 2017-07-20 DIAGNOSIS — G8929 Other chronic pain: Secondary | ICD-10-CM

## 2017-07-20 MED ORDER — GABAPENTIN 600 MG PO TABS: 600.0000 mg | ORAL_TABLET | Freq: Three times a day (TID) | ORAL | 0 refills | Status: DC

## 2017-07-20 MED ORDER — HYDROCODONE-ACETAMINOPHEN 10-325 MG PO TABS: 1.0000 | ORAL_TABLET | Freq: Three times a day (TID) | ORAL | 0 refills | Status: DC | PRN

## 2017-07-20 MED ORDER — DULOXETINE HCL 30 MG PO CPEP: 30.0000 mg | ORAL_CAPSULE | Freq: Every day | ORAL | 0 refills | Status: DC

## 2017-07-20 MED ORDER — HYDROCODONE-ACETAMINOPHEN 10-325 MG PO TABS: 1.0000 | ORAL_TABLET | Freq: Four times a day (QID) | ORAL | 0 refills | Status: DC | PRN

## 2017-07-20 MED ORDER — TRAZODONE HCL 50 MG PO TABS: 50.0000 mg | ORAL_TABLET | Freq: Every day | ORAL | 0 refills | Status: DC

## 2017-07-20 MED ORDER — ELUXADOLINE 75 MG PO TABS
1.0000 | ORAL_TABLET | Freq: Two times a day (BID) | ORAL | 2 refills | Status: DC
Start: 2017-07-20 — End: 2017-10-19

## 2017-07-20 MED ORDER — ALPRAZOLAM 1 MG PO TABS: 1.0000 mg | ORAL_TABLET | Freq: Every evening | ORAL | 2 refills | Status: DC | PRN

## 2017-07-20 NOTE — Telephone Encounter (Signed)
Refer to spine surgeon/[facility], either Dr. Ethelene Halamos or whoever does consult for spinal cord stimulator.   She has chronic back pain, prior back surgery and steroid injections.   Takes chronic pain medication but begin sent to explore other therapy.

## 2017-07-20 NOTE — Progress Notes (Signed)
Subjective: Chief Complaint  Patient presents with  . Follow-up    1 month follow up    Here for f/u from her last visit recently to establish care.    Elevated LFT from last visit.  Denies alcohol use.  Has been using more tylenol give back pain.   No hx/o IV drug use or hepatitis.    Sees gynecology next week.    Since last visit she notes quite a bit of improvement in diarrhea on Viberzi.  Wants to c/t this.  She has hx/o chronic abdominal pain, IBS diarrhea.   She has had extensive GI evaluation, and had colonoscopy last year with Dr. Teena Snyder in [place].   No other cause found, so diagnosis is IBS-D.  She notes trials of prior medications for diarrhea, but none helped.  just deals with the diarrhea now.  Had updated colonoscopy 2017, Dr. Teena Snyder.   She has chronic back pain, hx/o spinal surgery 2012 with Dr. Sandria Snyder, and has neuropathy.  She is using Gabapentin TID and had been using Norco 10/325mg  2-3 times daily for many years now.  No prior pain clinic, no recent neurology or neurosurgery consult . She feels she has been stable on this for a long time.  Since last visit she has been rationing the remaining norco she has.  Legs stay tingling and numb.  Has hx/o EDSI as well.  Has tried TENS unit and prior PT.     She has depression, anxiety, and has felt depressed for years. She does note improvement in med since adding back Cymbalta last visit.   She continues Xanax for anxiety and sleep with good improvement. Stressors include husband's heart attack last year and her own chronic health issues   Works part time at [employer] about [employer]  Past Medical History:  Diagnosis Date  . Anxiety   . Depression   . Diarrhea   . Elevated liver enzymes   . IBS (irritable bowel syndrome) 09/04/2011  . Left leg pain    related to L4-L5 ruptured disc   No current outpatient prescriptions on file prior to visit.   No current facility-administered medications on file prior to visit.    ROS as in  subjective    Objective: BP 110/68   Pulse 85   Wt 120 lb 3.2 oz (54.5 kg)   SpO2 99%   BMI 20.31 kg/m   Gen: wd, wn, nad otherwise not examined today    Assessment: Encounter Diagnoses  Name Primary?  . Chronic back pain, unspecified back location, unspecified back pain laterality Yes  . Elevated LFTs   . Irritable bowel syndrome, unspecified type   . Anxiety     Plan: I reviewed last office note here as well as records from [place] that came in.   Chronic back pain - advised I could not prescribe 3-4 hydrocodone daily as she was being prescribed long term from prior PCP.   However I will give her #30/month to help for breakthrough pain.  C/t Cymbalta and gabapentin.  We will refer to spine specialist for consult on other remedies including consult on spinal cord stimulator.  Also discussed integrative therapies as an option for consult.  Elevated lft last visit - repeat lab today  IBS - improved on Viberzi.      Shelia Snyder was seen today for follow-up.  Diagnoses and all orders for this visit:  Chronic back pain, unspecified back location, unspecified back pain laterality  Elevated LFTs -  Hepatic function panel  Irritable bowel syndrome, unspecified type  Anxiety  Other orders -     Eluxadoline (VIBERZI) 75 MG TABS; Take 1 tablet by mouth 2 (two) times daily. -     gabapentin (NEURONTIN) 600 MG tablet; Take 1 tablet (600 mg total) by mouth 3 (three) times daily. -     ALPRAZolam (XANAX) 1 MG tablet; Take 1 tablet (1 mg total) by mouth at bedtime as needed. For anxiety -     traZODone (DESYREL) 50 MG tablet; Take 1 tablet (50 mg total) by mouth at bedtime. -     HYDROcodone-acetaminophen (NORCO) 10-325 MG tablet; Take 1 tablet by mouth every 6 (six) hours as needed. -     DULoxetine (CYMBALTA) 30 MG capsule; Take 1 capsule (30 mg total) by mouth daily. -     HYDROcodone-acetaminophen (NORCO) 10-325 MG tablet; Take 1 tablet by mouth every 6 (six) hours as needed. -      HYDROcodone-acetaminophen (NORCO) 10-325 MG tablet; Take 1 tablet by mouth every 8 (eight) hours as needed.

## 2017-07-21 LAB — HEPATIC FUNCTION PANEL
ALBUMIN: 4.7 g/dL (ref 3.6–5.1)
ALK PHOS: 91 U/L (ref 33–130)
ALT: 25 U/L (ref 6–29)
AST: 25 U/L (ref 10–35)
Bilirubin, Direct: 0.1 mg/dL (ref <=0.2)
Indirect Bilirubin: 0.4 mg/dL (ref 0.2–1.2)
TOTAL PROTEIN: 7 g/dL (ref 6.1–8.1)
Total Bilirubin: 0.5 mg/dL (ref 0.2–1.2)

## 2017-07-21 NOTE — Telephone Encounter (Signed)
Sent referral to dr.ramos

## 2017-07-21 NOTE — Telephone Encounter (Signed)
Sent referral to pt

## 2017-08-08 ENCOUNTER — Telehealth: Payer: Self-pay | Admitting: Medical

## 2017-08-08 NOTE — Telephone Encounter (Signed)
P.A. VIBERZI 

## 2017-08-15 NOTE — Telephone Encounter (Signed)
P.A. Approved til 02/04/18

## 2017-08-26 NOTE — Telephone Encounter (Signed)
Left message for pt

## 2017-10-10 ENCOUNTER — Other Ambulatory Visit: Payer: Self-pay | Admitting: Medical

## 2017-10-11 NOTE — Telephone Encounter (Signed)
Can pt have a refill on meds  

## 2017-10-18 ENCOUNTER — Telehealth: Payer: Self-pay | Admitting: Medical

## 2017-10-18 NOTE — Telephone Encounter (Signed)
Pt called and is requesting a refill on her Norco and her Prudy FeelerXANAX pt uses [id number] [place], [place] - [address] pt can be reached at [phone]

## 2017-10-18 NOTE — Telephone Encounter (Signed)
Her medications require q8019mo f/u.   Have her come in for f/u

## 2017-10-19 ENCOUNTER — Encounter: Payer: Self-pay | Admitting: Medical

## 2017-10-19 ENCOUNTER — Ambulatory Visit: Payer: 59 | Admitting: Medical

## 2017-10-19 VITALS — BP 112/68 | HR 89 | Wt 123.2 lb

## 2017-10-19 DIAGNOSIS — K589 Irritable bowel syndrome without diarrhea: Secondary | ICD-10-CM

## 2017-10-19 DIAGNOSIS — M545 Low back pain: Secondary | ICD-10-CM

## 2017-10-19 DIAGNOSIS — R0981 Nasal congestion: Secondary | ICD-10-CM

## 2017-10-19 DIAGNOSIS — F419 Anxiety disorder, unspecified: Secondary | ICD-10-CM | POA: Diagnosis not present

## 2017-10-19 DIAGNOSIS — M549 Dorsalgia, unspecified: Secondary | ICD-10-CM

## 2017-10-19 DIAGNOSIS — R0982 Postnasal drip: Secondary | ICD-10-CM

## 2017-10-19 DIAGNOSIS — G8929 Other chronic pain: Secondary | ICD-10-CM | POA: Insufficient documentation

## 2017-10-19 MED ORDER — HYDROCODONE-ACETAMINOPHEN 10-325 MG PO TABS: 1.0000 | ORAL_TABLET | Freq: Three times a day (TID) | ORAL | 0 refills | Status: DC | PRN

## 2017-10-19 MED ORDER — DULOXETINE HCL 30 MG PO CPEP: 30.0000 mg | ORAL_CAPSULE | Freq: Every day | ORAL | 1 refills | Status: DC

## 2017-10-19 MED ORDER — TRAZODONE HCL 50 MG PO TABS: ORAL_TABLET | ORAL | 1 refills | Status: DC

## 2017-10-19 MED ORDER — ALPRAZOLAM 1 MG PO TABS: 1.0000 mg | ORAL_TABLET | Freq: Every evening | ORAL | 2 refills | Status: DC | PRN

## 2017-10-19 MED ORDER — HYDROCODONE-ACETAMINOPHEN 10-325 MG PO TABS: 1.0000 | ORAL_TABLET | Freq: Four times a day (QID) | ORAL | 0 refills | Status: DC | PRN

## 2017-10-19 MED ORDER — GABAPENTIN 600 MG PO TABS: 600.0000 mg | ORAL_TABLET | Freq: Three times a day (TID) | ORAL | 1 refills | Status: DC

## 2017-10-19 MED ORDER — ELUXADOLINE 75 MG PO TABS: 1.0000 | ORAL_TABLET | Freq: Two times a day (BID) | ORAL | 1 refills | Status: DC

## 2017-10-19 NOTE — Telephone Encounter (Signed)
Pt is coming in today 10/19/2017

## 2017-10-19 NOTE — Progress Notes (Signed)
Subjective: Chief Complaint  Patient presents with  . Follow-up    follow up for meds, no other concerns    Here for 44mo med check.  She has chronic back pain, hx/o spinal surgery 2012 with Dr. Sandria ManlyLove, and has neuropathy.  She is using Gabapentin TID and had been using Norco 10/325mg  2-3 times daily for many years now.  No prior pain clinic, no recent neurology or neurosurgery consult . She feels she has been stable on this for a long time.  Since last visit she has been rationing the remaining norco she has.  Legs stay tingling and numb.  Has hx/o EDSI as well.  Has tried TENS unit and prior PT.     She has depression, anxiety, and has felt depressed for years. Doing fine on Cymbalta last visit.   She continues Xanax for anxiety and sleep with good improvement. Stressors include husband's heart attack last year and her own chronic health issues   She has hx/o chronic abdominal pain, IBS diarrhea.   She has had extensive GI evaluation, and had colonoscopy last year with Dr. Teena DunkBenson in [place].   No other cause found, so diagnosis is IBS-D.  She notes trials of prior medications for diarrhea, but none helped.  just deals with the diarrhea now.  Had updated colonoscopy 2017, Dr. Teena DunkBenson.  Is doing fine on Viberzi.   Taking it twice daily constipated her, so she is using it every 3rd day.   Has had some post nasal drainage a few weeks.  Using OTC zyrtec and mucous medication.   No fever, no chest congestion.  Tight in neck.  Feels like it is lingering.   Works part time at [employer] about [employer]  Past Medical History:  Diagnosis Date  . Anxiety   . Depression   . Diarrhea   . Elevated liver enzymes   . IBS (irritable bowel syndrome) 09/04/2011  . Left leg pain    related to L4-L5 ruptured disc   No current outpatient medications on file prior to visit.   No current facility-administered medications on file prior to visit.    ROS as in subjective    Objective: BP 112/68   Pulse 89   Wt  123 lb 3.2 oz (55.9 kg)   SpO2 98%   BMI 20.82 kg/m   Wt Readings from Last 3 Encounters:  10/19/17 123 lb 3.2 oz (55.9 kg)  07/20/17 120 lb 3.2 oz (54.5 kg)  06/15/17 119 lb 9.6 oz (54.3 kg)   General appearance: alert, no distress, WD/WN, lean white female HEENT: normocephalic, sclerae anicteric, TMs pearly, nares patent, no discharge or erythema, pharynx normal Oral cavity: MMM, no lesions Neck: supple, no lymphadenopathy, +mild thyromegaly, R>L, no masses Heart: RRR, normal S1, S2, no murmurs Lungs: CTA bilaterally, no wheezes, rhonchi, or rales Abdomen: +bs, soft, non tender, non distended, no masses, no hepatomegaly, no splenomegaly Lumbar surgical scar, otherwise nontender Pulses: 2+ symmetric, upper and lower extremities, normal cap refill Ext: no edema    Assessment: Encounter Diagnoses  Name Primary?  Marland Kitchen. Anxiety Yes  . Chronic low back pain without sciatica, unspecified back pain laterality   . Irritable bowel syndrome, unspecified type   . Post-nasal drainage   . Nasal congestion       Plan: Anxiety - stable on current medications  Chronic back pain - C/t Cymbalta and gabapentin, limited Norco use, daily stretching and exercise  IBS - c/t Viberzi every 3rd day, avoid diet triggers  Post nasal drainage, and congestion - advised mucinex, nasal saline.  If not improving in the next week, then call back.  The [facility] Cont[facility] Substance Reporting System monitoring program was reviewed and showed no aberrancies.    discussed chronic use of opioids and medications with potential for abuse.   Risks and side effects are discussed, discussed follow up, safeguarding medications, being consistent with 1 pharmacy.   Joyce GrossKay was seen today for follow-up.  Diagnoses and all orders for this visit:  Anxiety  Chronic low back pain without sciatica, unspecified back pain laterality  Irritable bowel syndrome, unspecified type  Post-nasal drainage  Nasal  congestion  Other orders -     ALPRAZolam (XANAX) 1 MG tablet; Take 1 tablet (1 mg total) by mouth at bedtime as needed. For anxiety -     DULoxetine (CYMBALTA) 30 MG capsule; Take 1 capsule (30 mg total) by mouth daily. -     Eluxadoline (VIBERZI) 75 MG TABS; Take 1 tablet by mouth 2 (two) times daily. -     gabapentin (NEURONTIN) 600 MG tablet; Take 1 tablet (600 mg total) by mouth 3 (three) times daily. -     HYDROcodone-acetaminophen (NORCO) 10-325 MG tablet; Take 1 tablet by mouth every 6 (six) hours as needed. -     traZODone (DESYREL) 50 MG tablet; TAKE 1 TABLET BY MOUTH EVERYDAY AT BEDTIME -     HYDROcodone-acetaminophen (NORCO) 10-325 MG tablet; Take 1 tablet by mouth every 8 (eight) hours as needed. -     HYDROcodone-acetaminophen (NORCO) 10-325 MG tablet; Take 1 tablet by mouth every 8 (eight) hours as needed.

## 2017-11-24 ENCOUNTER — Telehealth: Payer: Self-pay | Admitting: Medical

## 2017-11-24 NOTE — Telephone Encounter (Signed)
I was notified by patient's husband that pharmacy would only refill 5 day supply although medication is not written that way.   Wants me to call the pharmacy

## 2017-11-25 NOTE — Telephone Encounter (Signed)
I have spoken to pharmacy and they are suppose to be sending Shelia Snyder a prior authorization from her insurer.   We will await the paperwork.

## 2018-01-17 ENCOUNTER — Ambulatory Visit: Payer: 59 | Admitting: Medical

## 2018-01-26 ENCOUNTER — Ambulatory Visit (INDEPENDENT_AMBULATORY_CARE_PROVIDER_SITE_OTHER): Payer: Managed Care, Other (non HMO) | Admitting: Medical

## 2018-01-26 ENCOUNTER — Telehealth: Payer: Self-pay

## 2018-01-26 ENCOUNTER — Encounter: Payer: Self-pay | Admitting: Medical

## 2018-01-26 VITALS — BP 122/80 | HR 88 | Wt 125.6 lb

## 2018-01-26 DIAGNOSIS — G4700 Insomnia, unspecified: Secondary | ICD-10-CM | POA: Diagnosis not present

## 2018-01-26 DIAGNOSIS — K589 Irritable bowel syndrome without diarrhea: Secondary | ICD-10-CM

## 2018-01-26 DIAGNOSIS — K219 Gastro-esophageal reflux disease without esophagitis: Secondary | ICD-10-CM

## 2018-01-26 DIAGNOSIS — G8929 Other chronic pain: Secondary | ICD-10-CM | POA: Diagnosis not present

## 2018-01-26 DIAGNOSIS — F419 Anxiety disorder, unspecified: Secondary | ICD-10-CM

## 2018-01-26 DIAGNOSIS — M545 Low back pain, unspecified: Secondary | ICD-10-CM

## 2018-01-26 MED ORDER — HYDROCODONE-ACETAMINOPHEN 10-325 MG PO TABS: 1.0000 | ORAL_TABLET | Freq: Two times a day (BID) | ORAL | 0 refills | Status: DC | PRN

## 2018-01-26 MED ORDER — ALPRAZOLAM 1 MG PO TABS: 1.0000 mg | ORAL_TABLET | Freq: Every evening | ORAL | 2 refills | Status: DC | PRN

## 2018-01-26 MED ORDER — OMEPRAZOLE 40 MG PO CPDR: 40.0000 mg | DELAYED_RELEASE_CAPSULE | Freq: Every day | ORAL | 2 refills | Status: DC

## 2018-01-26 NOTE — Telephone Encounter (Signed)
Please advise if this is hydrocodone is ok to fill for pt. Thanks [employer]

## 2018-01-26 NOTE — Progress Notes (Signed)
Subjective: Chief Complaint  Patient presents with  . Follow-up    follow up 3 month , acid reflux , pt needs refill on all her meds   Here for 38mo med check.  Exercises with walking.  No DOE, no SOB, no wheezing, no chest pain.    Lately having GERD, but hasn't had problems for years.  Cut back on eating late, just changed jobs recently, working at [employer] in [place].  Trying OTC omeprazole.    e has chronic back pain, hx/o spinal surgery 2012 with Dr. Sandria Manly, and has neuropathy.  She is using Gabapentin TID and had been using Norco 10/325mg  2-3 times daily for many years now.  No prior pain clinic, no recent neurology or neurosurgery consult . She feels she has been stable on this for a long time.  Since last visit she has been rationing the remaining norco she has.  Legs stay tingling and numb.  Has hx/o EDSI as well.  Has tried TENS unit and prior PT.     She has depression, anxiety, and has felt depressed for years. Doing fine on Cymbalta last visit.   She continues Xanax for anxiety and sleep with good improvement. Stressors include husband's heart attack last year and her own chronic health issues    Past Medical History:  Diagnosis Date  . Anxiety   . Depression   . Diarrhea   . Elevated liver enzymes   . IBS (irritable bowel syndrome) 09/04/2011  . Left leg pain    related to L4-L5 ruptured disc   Current Outpatient Medications on File Prior to Visit  Medication Sig Dispense Refill  . DULoxetine (CYMBALTA) 30 MG capsule Take 1 capsule (30 mg total) by mouth daily. 90 capsule 1  . Eluxadoline (VIBERZI) 75 MG TABS Take 1 tablet by mouth 2 (two) times daily. 180 tablet 1  . gabapentin (NEURONTIN) 600 MG tablet Take 1 tablet (600 mg total) by mouth 3 (three) times daily. 270 tablet 1  . traZODone (DESYREL) 50 MG tablet TAKE 1 TABLET BY MOUTH EVERYDAY AT BEDTIME 90 tablet 1   No current facility-administered medications on file prior to visit.    ROS as in  subjective    Objective: BP 122/80   Pulse 88   Wt 125 lb 9.6 oz (57 kg)   SpO2 97%   BMI 21.23 kg/m   Wt Readings from Last 3 Encounters:  01/26/18 125 lb 9.6 oz (57 kg)  10/19/17 123 lb 3.2 oz (55.9 kg)  07/20/17 120 lb 3.2 oz (54.5 kg)   General appearance: alert, no distress, WD/WN, lean white female Neck: supple, no lymphadenopathy, +mild thyromegaly, R>L, no masses Heart: RRR, normal S1, S2, no murmurs Lungs: CTA bilaterally, no wheezes, rhonchi, or rales Abdomen: +bs, soft, non tender, non distended, no masses, no hepatomegaly, no splenomegaly Lumbar surgical scar, otherwise nontender Pulses: 2+ symmetric, upper and lower extremities, normal cap refill Ext: no edema   Assessment: Encounter Diagnoses  Name Primary?  . Chronic low back pain without sciatica, unspecified back pain laterality Yes  . Anxiety   . Irritable bowel syndrome, unspecified type   . Insomnia, unspecified type   . Gastroesophageal reflux disease, esophagitis presence not specified       Plan: Anxiety - stable on current medications  Chronic back pain - C/t Cymbalta and gabapentin, limited Norco use, daily stretching and exercise  IBS - c/t Viberzi every 3rd day, avoid diet triggers  discussed chronic use of opioids  and medications with potential for abuse.   Risks and side effects are discussed, discussed follow up, safeguarding medications, being consistent with 1 pharmacy.  GERD - begin trial of Omeprazole, GERD triggers, f/u 2wk if not resolved or much improved   Joyce GrossKay was seen today for follow-up.  Diagnoses and all orders for this visit:  Chronic low back pain without sciatica, unspecified back pain laterality  Anxiety  Irritable bowel syndrome, unspecified type  Insomnia, unspecified type  Gastroesophageal reflux disease, esophagitis presence not specified  Other orders -     omeprazole (PRILOSEC) 40 MG capsule; Take 1 capsule (40 mg total) by mouth daily. -      ALPRAZolam (XANAX) 1 MG tablet; Take 1 tablet (1 mg total) by mouth at bedtime as needed. For anxiety -     HYDROcodone-acetaminophen (NORCO) 10-325 MG tablet; Take 1 tablet by mouth 2 (two) times daily as needed. -     HYDROcodone-acetaminophen (NORCO) 10-325 MG tablet; Take 1 tablet by mouth 2 (two) times daily as needed. -     HYDROcodone-acetaminophen (NORCO) 10-325 MG tablet; Take 1 tablet by mouth 2 (two) times daily as needed.

## 2018-01-27 MED ORDER — HYDROCODONE-ACETAMINOPHEN 10-325 MG PO TABS: 1.0000 | ORAL_TABLET | Freq: Two times a day (BID) | ORAL | 0 refills | Status: DC | PRN

## 2018-01-27 NOTE — Addendum Note (Signed)
Addended by: Jac CanavanYSINGER, DAVID S on: 01/27/2018 04:16 PM   Modules accepted: Orders

## 2018-04-12 ENCOUNTER — Other Ambulatory Visit: Payer: Self-pay | Admitting: Medical

## 2018-04-19 ENCOUNTER — Other Ambulatory Visit: Payer: Self-pay | Admitting: Medical

## 2018-04-28 ENCOUNTER — Ambulatory Visit (INDEPENDENT_AMBULATORY_CARE_PROVIDER_SITE_OTHER): Payer: Self-pay | Admitting: Medical

## 2018-04-28 ENCOUNTER — Encounter: Payer: Self-pay | Admitting: Medical

## 2018-04-28 VITALS — BP 120/74 | HR 79 | Temp 98.0°F | Ht 65.0 in | Wt 130.4 lb

## 2018-04-28 DIAGNOSIS — Z7185 Encounter for immunization safety counseling: Secondary | ICD-10-CM | POA: Insufficient documentation

## 2018-04-28 DIAGNOSIS — F419 Anxiety disorder, unspecified: Secondary | ICD-10-CM

## 2018-04-28 DIAGNOSIS — K589 Irritable bowel syndrome without diarrhea: Secondary | ICD-10-CM

## 2018-04-28 DIAGNOSIS — R0989 Other specified symptoms and signs involving the circulatory and respiratory systems: Secondary | ICD-10-CM

## 2018-04-28 DIAGNOSIS — G4700 Insomnia, unspecified: Secondary | ICD-10-CM

## 2018-04-28 DIAGNOSIS — G8929 Other chronic pain: Secondary | ICD-10-CM

## 2018-04-28 DIAGNOSIS — K219 Gastro-esophageal reflux disease without esophagitis: Secondary | ICD-10-CM

## 2018-04-28 DIAGNOSIS — M545 Low back pain: Secondary | ICD-10-CM

## 2018-04-28 DIAGNOSIS — R197 Diarrhea, unspecified: Secondary | ICD-10-CM

## 2018-04-28 DIAGNOSIS — Z7189 Other specified counseling: Secondary | ICD-10-CM | POA: Insufficient documentation

## 2018-04-28 MED ORDER — ALPRAZOLAM 1 MG PO TABS: 1.0000 mg | ORAL_TABLET | Freq: Every evening | ORAL | 2 refills | Status: DC | PRN

## 2018-04-28 MED ORDER — TRAZODONE HCL 50 MG PO TABS: ORAL_TABLET | ORAL | 3 refills | Status: DC

## 2018-04-28 MED ORDER — OMEPRAZOLE 40 MG PO CPDR: DELAYED_RELEASE_CAPSULE | ORAL | 3 refills | Status: DC

## 2018-04-28 MED ORDER — HYDROCODONE-ACETAMINOPHEN 10-325 MG PO TABS: 1.0000 | ORAL_TABLET | Freq: Two times a day (BID) | ORAL | 0 refills | Status: DC | PRN

## 2018-04-28 MED ORDER — GABAPENTIN 600 MG PO TABS: 600.0000 mg | ORAL_TABLET | Freq: Three times a day (TID) | ORAL | 3 refills | Status: DC

## 2018-04-28 MED ORDER — ELUXADOLINE 75 MG PO TABS: 1.0000 | ORAL_TABLET | Freq: Two times a day (BID) | ORAL | 3 refills | Status: DC

## 2018-04-28 NOTE — Patient Instructions (Addendum)
Recommendations: I don't have records for vaccinations. The current recommen dations include yearly flu shot, Tetanus diptheria vaccine every 10 years, and Shingrix vaccine  Please call your insurer to inquire about coverage for the vaccines above.  Next visit lets plan a physical and fasting labs.    Mammogram is recommended every 1-2 years, pap smear every 3 years if the last few were normal.    I don't have any records of your last mammogram or pap smear.   I would like to get a copy of this if you see gynecology.  If you don't see gynecology we can update this at next visit  I recommend a bone density test if you haven't had this  I don't have a copy of your last colonoscopy.   We will request a copy of this  I recommend exercising most days of the week using a type of exercise that they would enjoy and stick to such as walking, running, swimming, hiking, biking, aerobics, etc.  I recommend a healthy diet.    Do's:   whole grains such as whole grain pasta, rice, whole grains breads and whole grain cereals.  Use small quantities such as 1/2 cup per serving or 2 slices of bread per serving.    Eat 3-5 fruits daily  Eat beans at least once daily  Eat almonds in small quantities at least 3 days per week    If they eat meat, I recommend small portions of lean meats such as chicken, fish, and [place]key.  Eat as much NON corn and NON potato vegetables as they like, particularly raw or steamed  Drink several large glasses of water daily  Cautions:  Limit red meat  Limit corn and potatoes  Limit sweets, cake, pie, candy  Limit beer and alcohol  Avoid fried food, fast food, large portions  Avoid sugary drinks such as regular soda and sweet tea

## 2018-04-28 NOTE — Progress Notes (Signed)
Subjective: Chief Complaint  Patient presents with  . Follow-up    back pain   . Medication Refill   Here for 46mo med check.  She has depression, anxiety, and has felt depressed for years. Doing fine on Cymbalta last visit.   She continues Xanax for anxiety and sleep with good improvement. Stressors include husband's heart attack last year and her own chronic health issues   Husband just recently got laid off.  This has been stressful.   Her son continues to have stomach troubles which concerns her.  Lately her legs seem restless.  Wonders about circulation, coloration of legs.  She is a nonsmoker.   Mother had aortic aneurysm, fem pop bypass, PVD, but she was a smoker as well.   Exercises with walking and Yoga.   [facility]  She cut her right arm on a buggy at [employer].     Here for med check for chronic back pain, hx/o spinal surgery 2012 with Dr. Sandria ManlyLove, and has neuropathy.  She is using Gabapentin TID and Norco 10/325mg  daily prn for many years now. She feels she has been stable on this for a long time.  Has hx/o EDSI as well.  Has tried TENS unit and prior PT.      Past Medical History:  Diagnosis Date  . Anxiety   . Depression   . Diarrhea   . Elevated liver enzymes   . IBS (irritable bowel syndrome) 09/04/2011  . Left leg pain    related to L4-L5 ruptured disc   Current Outpatient Medications on File Prior to Visit  Medication Sig Dispense Refill  . DULoxetine (CYMBALTA) 30 MG capsule TAKE 1 CAPSULE BY MOUTH EVERY DAY 90 capsule 1  . chlorhexidine (PERIDEX) 0.12 % solution      No current facility-administered medications on file prior to visit.    ROS as in subjective    Objective: BP 120/74   Pulse 79   Temp 98 F (36.7 C) (Oral)   Ht 5\' 5"  (1.651 m)   Wt 130 lb 6.4 oz (59.1 kg)   SpO2 99%   BMI 21.70 kg/m   Wt Readings from Last 3 Encounters:  04/28/18 130 lb 6.4 oz (59.1 kg)  01/26/18 125 lb 9.6 oz (57 kg)   10/19/17 123 lb 3.2 oz (55.9 kg)   General appearance: alert, no distress, WD/WN, lean white female Neck: supple, no lymphadenopathy, +mild thyromegaly, R>L, no masses Heart: RRR, normal S1, S2, no murmurs Lungs: CTA bilaterally, no wheezes, rhonchi, or rales Abdomen: +bs, soft, non tender, non distended, no masses, no hepatomegaly, no splenomegaly, no bruits Lumbar surgical scar, otherwise nontender Pulses: 2+ symmetric, upper extremities, 1+ LE pulses, feet with cap refill maybe a little decreased Ext: no edema    Assessment: Encounter Diagnoses  Name Primary?  . Chronic low back pain without sciatica, unspecified back pain laterality Yes  . Insomnia, unspecified type   . Anxiety   . Irritable bowel syndrome, unspecified type   . Vaccine counseling   . Counseling on health promotion and disease prevention   . Diarrhea, unspecified type   . Gastroesophageal reflux disease, esophagitis presence not specified       Plan: Anxiety - stable on current medications  Chronic back pain - C/t Cymbalta and gabapentin, limited Norco use, daily stretching and exercise  IBS - c/t Viberzi , avoid diet triggers  discussed chronic use of opioids and medications with potential for abuse.  Risks and side effects are discussed, discussed follow up, safeguarding medications, being consistent with 1 pharmacy.  GERD - c/t Omeprazole, GERD triggers  Decreased pedal pulses - Advised ABIs.  She will consider and let me know.  Recommendations: I don't have records for vaccinations. The current recommendations include yearly flu shot, Tetanus diptheria vaccine every 10 years, and Shingrix vaccine  Please call your insurer to inquire about coverage for the vaccines above.  Next visit lets plan a physical and fasting labs.    Mammogram is recommended every 1-2 years, pap smear every 3 years if the last few were normal.    I don't have any records of your last mammogram or pap smear.   I  would like to get a copy of this if you see gynecology.  If you don't see gynecology we can update this at next visit  I recommend a bone density test if you haven't had this  I don't have a copy of your last colonoscopy.   We will request a copy of this  Counseled on healthy diet, regular exercise  Ravneet was seen today for follow-up and medication refill.  Diagnoses and all orders for this visit:  Chronic low back pain without sciatica, unspecified back pain laterality  Insomnia, unspecified type  Anxiety  Irritable bowel syndrome, unspecified type  Vaccine counseling  Counseling on health promotion and disease prevention  Diarrhea, unspecified type  Gastroesophageal reflux disease, esophagitis presence not specified  Other orders -     traZODone (DESYREL) 50 MG tablet; TAKE 1 TABLET BY MOUTH EVERYDAY AT BEDTIME -     omeprazole (PRILOSEC) 40 MG capsule; TAKE 1 CAPSULE BY MOUTH EVERY DAY -     HYDROcodone-acetaminophen (NORCO) 10-325 MG tablet; Take 1 tablet by mouth 2 (two) times daily as needed. -     gabapentin (NEURONTIN) 600 MG tablet; Take 1 tablet (600 mg total) by mouth 3 (three) times daily. -     Eluxadoline (VIBERZI) 75 MG TABS; Take 1 tablet by mouth 2 (two) times daily. -     ALPRAZolam (XANAX) 1 MG tablet; Take 1 tablet (1 mg total) by mouth at bedtime as needed. For anxiety  f/u 17mo fasting for physical

## 2018-04-29 ENCOUNTER — Encounter: Payer: Self-pay | Admitting: Medical

## 2018-05-02 ENCOUNTER — Encounter: Payer: Self-pay | Admitting: Medical

## 2018-05-13 ENCOUNTER — Telehealth: Payer: Self-pay | Admitting: Medical

## 2018-05-13 NOTE — Telephone Encounter (Signed)
Ok, please call in order for ABI blood flow study at [facility] [place]

## 2018-05-13 NOTE — Telephone Encounter (Signed)
Pt states insurance is straightened out and wants the ABI scheduled at the [facility] and would like afternoon appt

## 2018-05-16 NOTE — Telephone Encounter (Signed)
Faxed 05/16/18

## 2018-05-17 ENCOUNTER — Telehealth: Payer: Self-pay

## 2018-05-17 NOTE — Telephone Encounter (Signed)
Patient notified of appointment for ABI ankle brachial index for decreased pedal pulses on 05-20-18 at 100 be there at 1230 [facility].  [facility] [place].

## 2018-05-25 ENCOUNTER — Telehealth: Payer: Self-pay | Admitting: Medical

## 2018-05-25 NOTE — Telephone Encounter (Signed)
Received requested [facility] and [facility]. Sending back for review.

## 2018-05-25 NOTE — Telephone Encounter (Signed)
Mammogram was requested per pt instructions from [facility]. Received mammogram today from that facility. It is dated 10/15/2010. Sending back for review.

## 2018-05-31 ENCOUNTER — Telehealth: Payer: Self-pay | Admitting: Medical

## 2018-05-31 ENCOUNTER — Other Ambulatory Visit: Payer: Self-pay | Admitting: Medical

## 2018-05-31 MED ORDER — HYDROCODONE-ACETAMINOPHEN 10-325 MG PO TABS: 1.0000 | ORAL_TABLET | Freq: Every day | ORAL | 0 refills | Status: DC | PRN

## 2018-05-31 NOTE — Telephone Encounter (Signed)
Has symptoms refill, but please check on the test results because I have not seen them

## 2018-05-31 NOTE — Telephone Encounter (Signed)
Pt called for refills of hydrocodone. She did make an appt for a CPE in august. She will need refills to cover until that appt. [facility]  ALSO  Pt states she had [facility] ABI [facility] and she doesn't have the results.  Please advise pt.

## 2018-05-31 NOTE — Telephone Encounter (Signed)
I called [facility] in [place] to get report for ABI.   They state that they will fax it to us.

## 2018-06-01 ENCOUNTER — Telehealth: Payer: Self-pay | Admitting: Medical

## 2018-06-01 NOTE — Telephone Encounter (Signed)
ABI screen/blood flow screen normal

## 2018-06-01 NOTE — Telephone Encounter (Signed)
Patient notified of results.

## 2018-06-02 ENCOUNTER — Encounter: Payer: Self-pay | Admitting: Medical

## 2018-07-01 ENCOUNTER — Encounter: Payer: Self-pay | Admitting: Medical

## 2018-07-01 ENCOUNTER — Ambulatory Visit (INDEPENDENT_AMBULATORY_CARE_PROVIDER_SITE_OTHER): Payer: Managed Care, Other (non HMO) | Admitting: Medical

## 2018-07-01 VITALS — BP 130/78 | HR 76 | Temp 98.0°F | Ht 65.0 in | Wt 134.2 lb

## 2018-07-01 DIAGNOSIS — G4700 Insomnia, unspecified: Secondary | ICD-10-CM

## 2018-07-01 DIAGNOSIS — Z0000 Encounter for general adult medical examination without abnormal findings: Secondary | ICD-10-CM

## 2018-07-01 DIAGNOSIS — D696 Thrombocytopenia, unspecified: Secondary | ICD-10-CM

## 2018-07-01 DIAGNOSIS — K589 Irritable bowel syndrome without diarrhea: Secondary | ICD-10-CM

## 2018-07-01 DIAGNOSIS — Z90710 Acquired absence of both cervix and uterus: Secondary | ICD-10-CM

## 2018-07-01 DIAGNOSIS — F419 Anxiety disorder, unspecified: Secondary | ICD-10-CM | POA: Diagnosis not present

## 2018-07-01 DIAGNOSIS — Z780 Asymptomatic menopausal state: Secondary | ICD-10-CM

## 2018-07-01 DIAGNOSIS — R945 Abnormal results of liver function studies: Secondary | ICD-10-CM

## 2018-07-01 DIAGNOSIS — E2839 Other primary ovarian failure: Secondary | ICD-10-CM | POA: Diagnosis not present

## 2018-07-01 DIAGNOSIS — Z7185 Encounter for immunization safety counseling: Secondary | ICD-10-CM

## 2018-07-01 DIAGNOSIS — Z136 Encounter for screening for cardiovascular disorders: Secondary | ICD-10-CM

## 2018-07-01 DIAGNOSIS — K219 Gastro-esophageal reflux disease without esophagitis: Secondary | ICD-10-CM | POA: Diagnosis not present

## 2018-07-01 DIAGNOSIS — Z8249 Family history of ischemic heart disease and other diseases of the circulatory system: Secondary | ICD-10-CM

## 2018-07-01 DIAGNOSIS — E876 Hypokalemia: Secondary | ICD-10-CM

## 2018-07-01 DIAGNOSIS — R7989 Other specified abnormal findings of blood chemistry: Secondary | ICD-10-CM

## 2018-07-01 DIAGNOSIS — Z7189 Other specified counseling: Secondary | ICD-10-CM

## 2018-07-01 DIAGNOSIS — M545 Low back pain, unspecified: Secondary | ICD-10-CM

## 2018-07-01 DIAGNOSIS — G8929 Other chronic pain: Secondary | ICD-10-CM

## 2018-07-01 DIAGNOSIS — Z23 Encounter for immunization: Secondary | ICD-10-CM

## 2018-07-01 LAB — POCT URINALYSIS DIP (PROADVANTAGE DEVICE)
BILIRUBIN UA: NEGATIVE
BILIRUBIN UA: NEGATIVE mg/dL
Blood, UA: NEGATIVE
Glucose, UA: NEGATIVE mg/dL
Nitrite, UA: NEGATIVE
PH UA: 6 (ref 5.0–8.0)
PROTEIN UA: NEGATIVE mg/dL

## 2018-07-01 MED ORDER — HYDROCODONE-ACETAMINOPHEN 10-325 MG PO TABS: 1.0000 | ORAL_TABLET | Freq: Every day | ORAL | 0 refills | Status: DC | PRN

## 2018-07-01 NOTE — Patient Instructions (Signed)
Thanks for trusting [place] with your health care and for coming in for a physical today.  Below are some general recommendations I have for you:  Yearly screenings See your eye doctor yearly for routine vision care. See your dentist yearly for routine dental care including hygiene visits twice yearly. See me here yearly for a routine physical and preventative care visit   Cancer screening Colon cancer screening:   Call Dr. Starr Lake office to inquire about date of repeat colonoscopy   Osteoporosis screening/Bone Density test - I recommend a bone density test if you are over 54 years old, or under 37 years old if you have risk factors such as a bone fracture as an adult, parent with history of bone fracture as adult, thyroid disease, early menopause, or underweight for example.   Specific Concerns today:  . Shingles vaccine:  I recommend you have a shingles vaccine to help prevent shingles or herpes zoster outbreak.   Please call your insurer to inquire about coverage for the Shingrix vaccine given in 2 doses.   Some insurers cover this vaccine after age 25, some cover this after age 54.  If your insurer covers this, then call to schedule appointment to have this vaccine here. . Go for back xray for recent worsening pain . We will call with lab results   Please follow up yearly for a physical.    I have included other useful information below for your review.  Preventative Care for Adults - Female      MAINTAIN REGULAR HEALTH EXAMS:  A routine yearly physical is a good way to check in with your primary care provider about your health and preventive screening. It is also an opportunity to share updates about your health and any concerns you have, and receive a thorough all-over exam.   Most health insurance companies pay for at least some preventative services.  Check with your health plan for specific coverages.  WHAT PREVENTATIVE SERVICES DO WOMEN NEED?  Adult women should have  their weight and blood pressure checked regularly.   Women age 54 and older should have their cholesterol levels checked regularly.  Women should be screened for cervical cancer with a Pap smear and pelvic exam beginning at either age 65, or 3 years after they become sexually activity.    Breast cancer screening generally begins at age 54 with a mammogram and breast exam by your primary care provider.    Beginning at age 54 and continuing to age 25, women should be screened for colorectal cancer.  Certain people may need continued testing until age 54.  Updating vaccinations is part of preventative care.  Vaccinations help protect against diseases such as the flu.  Osteoporosis is a disease in which the bones lose minerals and strength as we age. Women ages 26 and over should discuss this with their caregivers, as should women after menopause who have other risk factors.  Lab tests are generally done as part of preventative care to screen for anemia and blood disorders, to screen for problems with the kidneys and liver, to screen for bladder problems, to check blood sugar, and to check your cholesterol level.  Preventative services generally include counseling about diet, exercise, avoiding tobacco, drugs, excessive alcohol consumption, and sexually transmitted infections.    GENERAL RECOMMENDATIONS FOR GOOD HEALTH:  Healthy diet:  Eat a variety of foods, including fruit, vegetables, animal or vegetable protein, such as meat, fish, chicken, and eggs, or beans, lentils, tofu, and grains, such  as rice.  Drink plenty of water daily.  Decrease saturated fat in the diet, avoid lots of red meat, processed foods, sweets, fast foods, and fried foods.  Exercise:  Aerobic exercise helps maintain good heart health. At least 30-40 minutes of moderate-intensity exercise is recommended. For example, a brisk walk that increases your heart rate and breathing. This should be done on most days of the  week.   Find a type of exercise or a variety of exercises that you enjoy so that it becomes a part of your daily life.  Examples are running, walking, swimming, water aerobics, and biking.  For motivation and support, explore group exercise such as aerobic class, spin class, Zumba, Yoga,or  martial arts, etc.    Set exercise goals for yourself, such as a certain weight goal, walk or run in a race such as a 5k walk/run.  Speak to your primary care provider about exercise goals.  Disease prevention:  If you smoke or chew tobacco, find out from your caregiver how to quit. It can literally save your life, no matter how long you have been a tobacco user. If you do not use tobacco, never begin.   Maintain a healthy diet and normal weight. Increased weight leads to problems with blood pressure and diabetes.   The Body Mass Index or BMI is a way of measuring how much of your body is fat. Having a BMI above 27 increases the risk of heart disease, diabetes, hypertension, stroke and other problems related to obesity. Your caregiver can help determine your BMI and based on it develop an exercise and dietary program to help you achieve or maintain this important measurement at a healthful level.  High blood pressure causes heart and blood vessel problems.  Persistent high blood pressure should be treated with medicine if weight loss and exercise do not work.   Fat and cholesterol leaves deposits in your arteries that can block them. This causes heart disease and vessel disease elsewhere in your body.  If your cholesterol is found to be high, or if you have heart disease or certain other medical conditions, then you may need to have your cholesterol monitored frequently and be treated with medication.   Ask if you should have a cardiac stress test if your history suggests this. A stress test is a test done on a treadmill that looks for heart disease. This test can find disease prior to there being a  problem.  Menopause can be associated with physical symptoms and risks. Hormone replacement therapy is available to decrease these. You should talk to your caregiver about whether starting or continuing to take hormones is right for you.   Osteoporosis is a disease in which the bones lose minerals and strength as we age. This can result in serious bone fractures. Risk of osteoporosis can be identified using a bone density scan. Women ages 1465 and over should discuss this with their caregivers, as should women after menopause who have other risk factors. Ask your caregiver whether you should be taking a calcium supplement and Vitamin D, to reduce the rate of osteoporosis.   Avoid drinking alcohol in excess (more than two drinks per day).  Avoid use of street drugs. Do not share needles with anyone. Ask for professional help if you need assistance or instructions on stopping the use of alcohol, cigarettes, and/or drugs.  Brush your teeth twice a day with fluoride toothpaste, and floss once a day. Good oral hygiene prevents tooth decay and  gum disease. The problems can be painful, unattractive, and can cause other health problems. Visit your dentist for a routine oral and dental check up and preventive care every 6-12 months.   Look at your skin regularly.  Use a mirror to look at your back. Notify your caregivers of changes in moles, especially if there are changes in shapes, colors, a size larger than a pencil eraser, an irregular border, or development of new moles.  Safety:  Use seatbelts 100% of the time, whether driving or as a passenger.  Use safety devices such as hearing protection if you work in environments with loud noise or significant background noise.  Use safety glasses when doing any work that could send debris in to the eyes.  Use a helmet if you ride a bike or motorcycle.  Use appropriate safety gear for contact sports.  Talk to your caregiver about gun safety.  Use sunscreen with a  SPF (or skin protection factor) of 15 or greater.  Lighter skinned people are at a greater risk of skin cancer. Don't forget to also wear sunglasses in order to protect your eyes from too much damaging sunlight. Damaging sunlight can accelerate cataract formation.   Practice safe sex. Use condoms. Condoms are used for birth control and to help reduce the spread of sexually transmitted infections (or STIs).  Some of the STIs are gonorrhea (the clap), chlamydia, syphilis, trichomonas, herpes, HPV (human papilloma virus) and HIV (human immunodeficiency virus) which causes AIDS. The herpes, HIV and HPV are viral illnesses that have no cure. These can result in disability, cancer and death.   Keep carbon monoxide and smoke detectors in your home functioning at all times. Change the batteries every 6 months or use a model that plugs into the wall.   Vaccinations:  Stay up to date with your tetanus shots and other required immunizations. You should have a booster for tetanus every 10 years. Be sure to get your flu shot every year, since 5%-20% of the [place] population comes down with the flu. The flu vaccine changes each year, so being vaccinated once is not enough. Get your shot in the fall, before the flu season peaks.   Other vaccines to consider:  Human Papilloma Virus or HPV causes cancer of the cervix, and other infections that can be transmitted from person to person. There is a vaccine for HPV, and females should get immunized between the ages of 5211 and 3226. It requires a series of 3 shots.   Pneumococcal vaccine to protect against certain types of pneumonia.  This is normally recommended for adults age 54 or older.  However, adults younger than 54 years old with certain underlying conditions such as diabetes, heart or lung disease should also receive the vaccine.  Shingles vaccine to protect against Varicella Zoster if you are older than age 54, or younger than 54 years old with certain underlying  illness.  If you have not had the Shingrix vaccine, please call your insurer to inquire about coverage for the Shingrix vaccine given in 2 doses.   Some insurers cover this vaccine after age 54, some cover this after age 54.  If your insurer covers this, then call to schedule appointment to have this vaccine here  Hepatitis A vaccine to protect against a form of infection of the liver by a virus acquired from food.  Hepatitis B vaccine to protect against a form of infection of the liver by a virus acquired from blood or body fluids,  particularly if you work in health care.  If you plan to travel internationally, check with your local health department for specific vaccination recommendations.  Cancer Screening:  Breast cancer screening is essential to preventive care for women. All women age 56 and older should perform a breast self-exam every month. At age 63 and older, women should have their caregiver complete a breast exam each year. Women at ages 26 and older should have a mammogram (x-ray film) of the breasts. Your caregiver can discuss how often you need mammograms.    Cervical cancer screening includes taking a Pap smear (sample of cells examined under a microscope) from the cervix (end of the uterus). It also includes testing for HPV (Human Papilloma Virus, which can cause cervical cancer). Screening and a pelvic exam should begin at age 65, or 3 years after a woman becomes sexually active. Screening should occur every year, with a Pap smear but no HPV testing, up to age 51. After age 53, you should have a Pap smear every 3 years with HPV testing, if no HPV was found previously.   Most routine colon cancer screening begins at the age of 6. On a yearly basis, doctors may provide special easy to use take-home tests to check for hidden blood in the stool. Sigmoidoscopy or colonoscopy can detect the earliest forms of colon cancer and is life saving. These tests use a small camera at the end of a  tube to directly examine the colon. Speak to your caregiver about this at age 38, when routine screening begins (and is repeated every 5 years unless early forms of pre-cancerous polyps or small growths are found).

## 2018-07-01 NOTE — Progress Notes (Signed)
Subjective: Chief Complaint  Patient presents with  . Annual Exam   Here for physical.    Medical team: Sees eye doctor and dentist Dr. Francee Piccolo in [facility] for gynecology Dr. Jones Skene in [place], surgery for colonoscopy Ruger Saxer, Kermit Balo, PA-C here for primary care   Concerns: Back has been giving her a fit, up and down due to pain.  Using additional Ibuprofen and Tylenol.  Using 1/2 pain pill BID (Hydrocodone).  No recent strenuous activity or change in activity, no recent fall or injury.  Takes Cymbalta 30mg  once daily at bedtime.  gabapentin 600mg  TID.  Was on lyrica in the past.   Vaccines: Last Td was from cut on arm 2019, done at hospital in [place] No prior shingles vaccine.  Last mammogram within past month.  Last colonoscopy 2016 in [place]  Reviewed their medical, surgical, family, social, medication, and allergy history and updated chart as appropriate.  Past Medical History:  Diagnosis Date  . Anxiety   . Depression   . Diarrhea   . Elevated liver enzymes   . IBS (irritable bowel syndrome) 09/04/2011  . Left leg pain    related to L4-L5 ruptured disc    Past Surgical History:  Procedure Laterality Date  . BACK SURGERY  2000   L4-L5 discectomy  . BREAST LUMPECTOMY     benign, right  . CHOLECYSTECTOMY    . COLONOSCOPY  09/05/2011   Procedure: COLONOSCOPY;  Surgeon: Malissa Hippo, MD;  Location: [facility];  Service: Endoscopy;  Laterality: N/A;  . COLONOSCOPY  2016   melanosis coli, Dr. Jones Skene  . OOPHORECTOMY     right, abnormal bleeding after hysterectomy  . TOTAL ABDOMINAL HYSTERECTOMY     has 1 ovary, hx/o heavy bleeding, s/p ablation    Social History   Socioeconomic History  . Marital status: Married    Spouse name: Not on file  . Number of children: Not on file  . Years of education: Not on file  . Highest education level: Not on file  Occupational History  . Not on file  Social Needs  . Financial resource strain: Not on  file  . Food insecurity:    Worry: Not on file    Inability: Not on file  . Transportation needs:    Medical: Not on file    Non-medical: Not on file  Tobacco Use  . Smoking status: Former Smoker    Packs/day: 1.00    Years: 19.00    Pack years: 19.00  . Smokeless tobacco: Never Used  Substance and Sexual Activity  . Alcohol use: No  . Drug use: No  . Sexual activity: Yes    Birth control/protection: None  Lifestyle  . Physical activity:    Days per week: Not on file    Minutes per session: Not on file  . Stress: Not on file  Relationships  . Social connections:    Talks on phone: Not on file    Gets together: Not on file    Attends religious service: Not on file    Active member of club or organization: Not on file    Attends meetings of clubs or organizations: Not on file    Relationship status: Not on file  . Intimate partner violence:    Fear of current or ex partner: Not on file    Emotionally abused: Not on file    Physically abused: Not on file    Forced sexual activity: Not on file  Other Topics Concern  . Not on file  Social History Narrative   Married, husband Aurelio Brash, son Ian Malkin, works at [employer], exercise with walking.   06/2018.    Family History  Problem Relation Age of Onset  . Heart attack Mother 101       MI  . Aneurysm Mother        abdominal, smoker  . Hypertension Mother   . Heart disease Mother   . COPD Father   . Diabetes Maternal Uncle   . Cancer Maternal Grandmother        colon  . Cancer Paternal Grandmother        breast  . Stroke Neg Hx      Current Outpatient Medications:  .  ALPRAZolam (XANAX) 1 MG tablet, Take 1 tablet (1 mg total) by mouth at bedtime as needed. For anxiety, Disp: 30 tablet, Rfl: 2 .  chlorhexidine (PERIDEX) 0.12 % solution, , Disp: , Rfl:  .  DULoxetine (CYMBALTA) 30 MG capsule, TAKE 1 CAPSULE BY MOUTH EVERY DAY, Disp: 90 capsule, Rfl: 1 .  Eluxadoline (VIBERZI) 75 MG TABS, Take 1 tablet by mouth 2 (two)  times daily., Disp: 180 tablet, Rfl: 3 .  gabapentin (NEURONTIN) 600 MG tablet, Take 1 tablet (600 mg total) by mouth 3 (three) times daily., Disp: 270 tablet, Rfl: 3 .  HYDROcodone-acetaminophen (NORCO) 10-325 MG tablet, Take 1 tablet by mouth daily as needed., Disp: 30 tablet, Rfl: 0 .  omeprazole (PRILOSEC) 40 MG capsule, TAKE 1 CAPSULE BY MOUTH EVERY DAY, Disp: 90 capsule, Rfl: 3 .  traZODone (DESYREL) 50 MG tablet, TAKE 1 TABLET BY MOUTH EVERYDAY AT BEDTIME, Disp: 90 tablet, Rfl: 3  Allergies  Allergen Reactions  . Latex   . Sulfa Antibiotics      Review of Systems Constitutional: -fever, -chills, -sweats, -unexpected weight change, -decreased appetite, -fatigue Allergy: -sneezing, -itching, -congestion Dermatology: -changing moles, --rash, -lumps ENT: -runny nose, -ear pain, -sore throat, -hoarseness, -sinus pain, -teeth pain, - ringing in ears, -hearing loss, -nosebleeds Cardiology: -chest pain, -palpitations, -swelling, -difficulty breathing when lying flat, -waking up short of breath Respiratory: -cough, -shortness of breath, -difficulty breathing with exercise or exertion, -wheezing, -coughing up blood Gastroenterology: +abdominal pain, -nausea, -vomiting, +diarrhea, -constipation, -blood in stool, -changes in bowel movement, -difficulty swallowing or eating Hematology: -bleeding, -bruising  Musculoskeletal: -joint aches, -muscle aches, -joint swelling, +back pain, -neck pain, -cramping, -changes in gait Ophthalmology: denies vision changes, eye redness, itching, discharge Urology: -burning with urination, -difficulty urinating, -blood in urine, -urinary frequency, -urgency, -incontinence Neurology: -headache, -weakness, -tingling, -numbness, -memory loss, -falls, -dizziness Psychology: -depressed mood, -agitation, -sleep problems Breast/gyn: -breast tenderness, -discharge, -lumps, -vaginal discharge,- irregular periods, -heavy periods     Objective:  BP 130/78   Pulse 76    Temp 98 F (36.7 C) (Oral)   Ht 5\' 5"  (1.651 m)   Wt 134 lb 3.2 oz (60.9 kg)   SpO2 98%   BMI 22.33 kg/m    Wt Readings from Last 3 Encounters:  07/01/18 134 lb 3.2 oz (60.9 kg)  04/28/18 130 lb 6.4 oz (59.1 kg)  01/26/18 125 lb 9.6 oz (57 kg)     General appearance: alert, no distress, WD/WN, Caucasian female Skin: scattered macules, no worrisome lesions HEENT: normocephalic, conjunctiva/corneas normal, sclerae anicteric, PERRLA, EOMi, nares patent, no discharge or erythema, pharynx normal Oral cavity: MMM, tongue normal, teeth normal Neck: supple, no lymphadenopathy, no thyromegaly, no masses, normal ROM, no bruits Chest: non tender, normal shape  and expansion Heart: RRR, normal S1, S2, no murmurs Lungs: CTA bilaterally, no wheezes, rhonchi, or rales Abdomen: +bs, soft, non tender, non distended, no masses, no hepatomegaly, no splenomegaly, no bruits Back: lumbar spine surgical scar, mild pain reported with ROM which is about 90% of normal flexion, normal extension otherwise non tender, normal ROM, no scoliosis Musculoskeletal: upper extremities non tender, no obvious deformity, normal ROM throughout, lower extremities non tender, no obvious deformity, normal ROM throughout Extremities: no edema, no cyanosis, no clubbing Pulses: 2+ symmetric, upper and lower extremities, normal cap refill Neurological: 4-5/5 strength left lower leg, pain noted in back with SLR bilat, otherwise alert, oriented x 3, CN2-12 intact, strength normal upper extremities and lower extremities, sensation normal throughout, DTRs 1+ throughout, no cerebellar signs, gait normal Psychiatric: normal affect, behavior normal, pleasant  Breast/gyn/rectal - deferred to gynecology    Adult ECG Report  Indication: physical  Rate: 77  bpm  Rhythm: normal sinus rhythm  QRS Axis: 45 degrees  PR Interval: 152ms  QRS Duration: 76ms  QTc: 434ms  Conduction Disturbances: none  Other Abnormalities: none  Patient's  cardiac risk factors are: none.  EKG comparison: none  Narrative Interpretation: normal EKG     Assessment and Plan :   Encounter Diagnoses  Name Primary?  . Encounter for health maintenance examination in adult Yes  . Irritable bowel syndrome, unspecified type   . Gastroesophageal reflux disease, esophagitis presence not specified   . Anxiety   . Chronic low back pain without sciatica, unspecified back pain laterality   . Insomnia, unspecified type   . Hypokalemia   . Vaccine counseling   . Thrombocytopenia (HCC)   . LFT elevation   . Need for influenza vaccination   . Screening for heart disease   . Family history of heart disease   . Post-menopausal   . Estrogen deficiency   . S/P hysterectomy     Physical exam - discussed and counseled on healthy lifestyle, diet, exercise, preventative care, vaccinations, sick and well care, proper use of emergency dept and after hours care, and addressed their concerns.    Health screening: Bone density - recommended bone density evaluation due to risk factors postmenopausal estrogen deficiency and lower normal BMI See your eye doctor yearly for routine vision care. See your dentist yearly for routine dental care including hygiene visits twice yearly.  Cancer screening Discussed and advised monthly self breast exams Discussed mammogram, advised mammogram   Colonoscopy:  Advised she call Dr. Starr LakeBenson's office to confirm 5 or 10 year repeat from 2016  Vaccinations: Advised yearly influenza vaccine Counseled on the influenza virus vaccine.  Vaccine information sheet given.  Influenza vaccine given after consent obtained.  [facility]  Counseled on Shingles vaccine at age 54 years and older Patient will check insurance coverage for this and consider vaccination  Acute issues discussed: worsening acute on chronic back pain - f/u pending labs, go for updated xray  Separate significant chronic  issues discussed: IBS - doing ok on current medication, diet control   Shelia GrossKay was seen today for annual exam.  Diagnoses and all orders for this visit:  Encounter for health maintenance examination in adult -     POCT Urinalysis DIP (Proadvantage Device) -     EKG 12-Lead -     DG Bone Density; Future -     Comprehensive metabolic panel -     CBC with Differential/Platelet -  TSH -     Lipid panel -     VITAMIN D 25 Hydroxy (Vit-D Deficiency, Fractures) -     DG Lumbar Spine Complete; Future  Irritable bowel syndrome, unspecified type  Gastroesophageal reflux disease, esophagitis presence not specified  Anxiety  Chronic low back pain without sciatica, unspecified back pain laterality -     DG Lumbar Spine Complete; Future  Insomnia, unspecified type  Hypokalemia  Vaccine counseling  Thrombocytopenia (HCC)  LFT elevation -     Comprehensive metabolic panel -     CBC with Differential/Platelet  Need for influenza vaccination  Screening for heart disease  Family history of heart disease  Post-menopausal -     EKG 12-Lead  Estrogen deficiency -     EKG 12-Lead  S/P hysterectomy  Other orders -     HYDROcodone-acetaminophen (NORCO) 10-325 MG tablet; Take 1 tablet by mouth daily as needed.   Follow-up pending labs, yearly for physical

## 2018-07-02 LAB — CBC WITH DIFFERENTIAL/PLATELET
BASOS ABS: 0 10*3/uL (ref 0.0–0.2)
BASOS: 1 %
EOS (ABSOLUTE): 0.2 10*3/uL (ref 0.0–0.4)
Eos: 3 %
HEMATOCRIT: 41.6 % (ref 34.0–46.6)
HEMOGLOBIN: 13.8 g/dL (ref 11.1–15.9)
Immature Grans (Abs): 0 10*3/uL (ref 0.0–0.1)
Immature Granulocytes: 0 %
Lymphocytes Absolute: 1.1 10*3/uL (ref 0.7–3.1)
Lymphs: 18 %
MCH: 29.9 pg (ref 26.6–33.0)
MCHC: 33.2 g/dL (ref 31.5–35.7)
MCV: 90 fL (ref 79–97)
MONOS ABS: 0.5 10*3/uL (ref 0.1–0.9)
Monocytes: 8 %
NEUTROS ABS: 4.3 10*3/uL (ref 1.4–7.0)
Neutrophils: 70 %
Platelets: 270 10*3/uL (ref 150–450)
RBC: 4.61 x10E6/uL (ref 3.77–5.28)
RDW: 14 % (ref 12.3–15.4)
WBC: 6 10*3/uL (ref 3.4–10.8)

## 2018-07-02 LAB — COMPREHENSIVE METABOLIC PANEL
A/G RATIO: 2.5 — AB (ref 1.2–2.2)
ALT: 24 IU/L (ref 0–32)
AST: 23 IU/L (ref 0–40)
Albumin: 4.7 g/dL (ref 3.5–5.5)
Alkaline Phosphatase: 83 IU/L (ref 39–117)
BILIRUBIN TOTAL: 0.6 mg/dL (ref 0.0–1.2)
BUN/Creatinine Ratio: 12 (ref 9–23)
BUN: 9 mg/dL (ref 6–24)
CALCIUM: 9.4 mg/dL (ref 8.7–10.2)
CHLORIDE: 100 mmol/L (ref 96–106)
CO2: 26 mmol/L (ref 20–29)
Creatinine, Ser: 0.73 mg/dL (ref 0.57–1.00)
GFR, EST AFRICAN AMERICAN: 108 mL/min/{1.73_m2} (ref >59)
GFR, EST NON AFRICAN AMERICAN: 94 mL/min/{1.73_m2} (ref >59)
GLOBULIN, TOTAL: 1.9 g/dL (ref 1.5–4.5)
Glucose: 84 mg/dL (ref 65–99)
Potassium: 4.5 mmol/L (ref 3.5–5.2)
SODIUM: 137 mmol/L (ref 134–144)
TOTAL PROTEIN: 6.6 g/dL (ref 6.0–8.5)

## 2018-07-02 LAB — LIPID PANEL
CHOL/HDL RATIO: 2.5 ratio (ref 0.0–4.4)
Cholesterol, Total: 194 mg/dL (ref 100–199)
HDL: 78 mg/dL (ref >39)
LDL CALC: 101 mg/dL — AB (ref 0–99)
Triglycerides: 74 mg/dL (ref 0–149)
VLDL CHOLESTEROL CAL: 15 mg/dL (ref 5–40)

## 2018-07-02 LAB — TSH: TSH: 1.06 u[IU]/mL (ref 0.450–4.500)

## 2018-07-02 LAB — VITAMIN D 25 HYDROXY (VIT D DEFICIENCY, FRACTURES): Vit D, 25-Hydroxy: 14.6 ng/mL — ABNORMAL LOW (ref 30.0–100.0)

## 2018-07-04 ENCOUNTER — Other Ambulatory Visit: Payer: Self-pay | Admitting: Medical

## 2018-07-04 MED ORDER — DULOXETINE HCL 30 MG PO CPEP: 30.0000 mg | ORAL_CAPSULE | Freq: Two times a day (BID) | ORAL | 2 refills | Status: DC

## 2018-07-04 MED ORDER — VITAMIN D (ERGOCALCIFEROL) 1.25 MG (50000 UNIT) PO CAPS: 50000.0000 [IU] | ORAL_CAPSULE | ORAL | 0 refills | Status: DC

## 2018-07-05 ENCOUNTER — Encounter: Payer: Self-pay | Admitting: Medical

## 2018-07-09 ENCOUNTER — Telehealth: Payer: Self-pay | Admitting: Medical

## 2018-07-09 NOTE — Telephone Encounter (Signed)
P.A. CYMBALTA for bid DOSING

## 2018-07-25 ENCOUNTER — Other Ambulatory Visit: Payer: Self-pay | Admitting: Medical

## 2018-07-25 NOTE — Telephone Encounter (Signed)
Is this ok to refill?  

## 2018-08-01 ENCOUNTER — Telehealth: Payer: Self-pay | Admitting: Medical

## 2018-08-01 ENCOUNTER — Other Ambulatory Visit: Payer: Self-pay | Admitting: Medical

## 2018-08-01 MED ORDER — HYDROCODONE-ACETAMINOPHEN 10-325 MG PO TABS: 1.0000 | ORAL_TABLET | Freq: Every day | ORAL | 0 refills | Status: DC | PRN

## 2018-08-01 NOTE — Telephone Encounter (Signed)
Pt requesting refill on Hydrocodone 10-325 mg to [facility] [address]

## 2018-08-02 NOTE — Telephone Encounter (Signed)
P.A. Approved, left message for pt 

## 2018-08-05 ENCOUNTER — Telehealth: Payer: Self-pay | Admitting: Medical

## 2018-08-05 NOTE — Telephone Encounter (Signed)
I am not sure what medication she is referring to

## 2018-08-05 NOTE — Telephone Encounter (Signed)
Ok thank you 

## 2018-08-05 NOTE — Telephone Encounter (Signed)
Is there any way you can call in an opioid pill that they use to use in the ER to settle the gut down.  It was a little bitty pill and comes in different doses.  He is not sure of the name.   I told him that I would ask and call him back if you called anything in.

## 2018-08-05 NOTE — Telephone Encounter (Signed)
Husband, Francis DowseJoel, called stating that pt's had vomiting and diarrhea for several days now. Vomiting has stopped with the help of Zofran. Diarrhea is excessive. She is on the toilet every 2-3 hours with lots of watery stool. Husband is afraid of pt getting dehydrated at this rate. What can they do? Call Francis DowseJoel at [phone]

## 2018-08-05 NOTE — Telephone Encounter (Signed)
Can use pepto bismol.   If desired can use Imodium ONLY if no fever and no blood in stool.  Hydrate throughout the day with soup, water, Gatorade, eat rice, applesauce, toast, bananas, etc.  Usually stomach virus lats 3-5 days, but if not sure about symptoms, can come in for eval or if really dehydrated, weak, dry mouth, etc, can go to the ED

## 2018-08-05 NOTE — Telephone Encounter (Signed)
fyi

## 2018-08-30 ENCOUNTER — Telehealth: Payer: Self-pay | Admitting: Medical

## 2018-08-30 ENCOUNTER — Other Ambulatory Visit: Payer: Self-pay | Admitting: Medical

## 2018-08-30 MED ORDER — HYDROCODONE-ACETAMINOPHEN 10-325 MG PO TABS: 1.0000 | ORAL_TABLET | Freq: Every day | ORAL | 0 refills | Status: DC | PRN

## 2018-08-30 NOTE — Telephone Encounter (Signed)
Pt called and [facility]

## 2018-09-15 ENCOUNTER — Other Ambulatory Visit: Payer: Self-pay | Admitting: Medical

## 2018-09-15 NOTE — Telephone Encounter (Signed)
Is this ok to refill?  

## 2018-09-19 ENCOUNTER — Telehealth: Payer: Self-pay

## 2018-09-19 NOTE — Telephone Encounter (Signed)
Since she sees gynecology in [place], they need to be the one ordering the mammogram.   We do need to be copied on the result though.     Please have her call gynecology.

## 2018-09-19 NOTE — Telephone Encounter (Signed)
Patient called and stated she will need diagnotic mammogram done at [facility] in [place] due to the symptoms she's having. Symptoms consist of Sharp pain in right breast with swelling

## 2018-09-19 NOTE — Telephone Encounter (Signed)
Order can be faxed to [phone] attn to [facility]

## 2018-09-20 NOTE — Telephone Encounter (Signed)
Patient notified of recommendations. 

## 2018-09-20 NOTE — Telephone Encounter (Signed)
Left message on voicemail for patient to call back. 

## 2018-09-21 ENCOUNTER — Telehealth: Payer: Self-pay | Admitting: Medical

## 2018-09-21 ENCOUNTER — Other Ambulatory Visit: Payer: Self-pay | Admitting: Medical

## 2018-09-21 DIAGNOSIS — R921 Mammographic calcification found on diagnostic imaging of breast: Secondary | ICD-10-CM | POA: Diagnosis not present

## 2018-09-21 DIAGNOSIS — N644 Mastodynia: Secondary | ICD-10-CM | POA: Diagnosis not present

## 2018-09-21 DIAGNOSIS — R928 Other abnormal and inconclusive findings on diagnostic imaging of breast: Secondary | ICD-10-CM | POA: Diagnosis not present

## 2018-09-21 LAB — HM MAMMOGRAPHY

## 2018-09-21 MED ORDER — HYDROCODONE-ACETAMINOPHEN 10-325 MG PO TABS: 1.0000 | ORAL_TABLET | Freq: Every day | ORAL | 0 refills | Status: DC | PRN

## 2018-09-21 NOTE — Telephone Encounter (Signed)
Pt called and is requesting  A refill on her Norco, states she has some broken ribs, she went to have her mammogram and they seen something and they are going to send her for a xray to check that, but she is in pain and she needs something for the pain, she is going to get them so send all of the results over after everything comes in form [facility], pt [id number] [place], [place] - [address] AT [place] [address] pt can be reached at [phone]

## 2018-09-21 NOTE — Telephone Encounter (Signed)
I received a order for additional breast imaging.  This needs to be sent to her gynecologist that she sees gynecology should be managing her mammogram and Pap smear issues.  Please have the requesting agency request this from gynecology

## 2018-09-22 ENCOUNTER — Telehealth: Payer: Self-pay

## 2018-09-22 ENCOUNTER — Ambulatory Visit
Admission: RE | Admit: 2018-09-22 | Discharge: 2018-09-22 | Disposition: A | Discharge status: Home / Self Care | Payer: BLUE CROSS/BLUE SHIELD | Source: Ambulatory Visit | Attending: Medical | Admitting: Medical

## 2018-09-22 ENCOUNTER — Encounter: Payer: Self-pay | Admitting: Medical

## 2018-09-22 ENCOUNTER — Other Ambulatory Visit: Payer: Self-pay | Admitting: Medical

## 2018-09-22 ENCOUNTER — Ambulatory Visit: Payer: BLUE CROSS/BLUE SHIELD | Admitting: Medical

## 2018-09-22 VITALS — BP 130/70 | HR 80 | Temp 97.8°F | Ht 65.0 in | Wt 139.0 lb

## 2018-09-22 DIAGNOSIS — G8929 Other chronic pain: Secondary | ICD-10-CM

## 2018-09-22 DIAGNOSIS — M545 Low back pain, unspecified: Secondary | ICD-10-CM

## 2018-09-22 DIAGNOSIS — R0781 Pleurodynia: Secondary | ICD-10-CM

## 2018-09-22 DIAGNOSIS — R52 Pain, unspecified: Secondary | ICD-10-CM

## 2018-09-22 DIAGNOSIS — W1800XA Striking against unspecified object with subsequent fall, initial encounter: Secondary | ICD-10-CM

## 2018-09-22 DIAGNOSIS — S2241XA Multiple fractures of ribs, right side, initial encounter for closed fracture: Secondary | ICD-10-CM | POA: Diagnosis not present

## 2018-09-22 DIAGNOSIS — Z0000 Encounter for general adult medical examination without abnormal findings: Secondary | ICD-10-CM

## 2018-09-22 DIAGNOSIS — S299XXA Unspecified injury of thorax, initial encounter: Secondary | ICD-10-CM | POA: Diagnosis not present

## 2018-09-22 NOTE — Progress Notes (Signed)
Subjective:  Shelia Snyder is a 52 y.o. female who presents for Chief Complaint  Patient presents with  . Chest Pain    cracked ribs found during mammogram ultrasound,hurting 2 weeks -locked out the housee and fell with a ladder      Here for complaint of chest wall pain.  She reports that 2 weeks ago she locked herself out of the house.  She use a ladder she had outside to try to get him through the balcony on the second floor.  The ladder slipped and she landed against a ladder which hit up against the wall.  She was able to climb down that she felt like she hurt her ribs doing this.  She has been dealing with the pain the best she can.  However she started having right breast pain and went to the [facility] up in [place] to have a mammogram and there was concern for rib fracture.  This was yesterday, that she came in today to see if she can get an x-ray to confirm.  She also notes that there was a problem being able to get her pain medicine at the pharmacy today.  She denies fever, no coughing up blood, no rash wheezing, no back or abdominal pain. No other aggravating or relieving factors. No other complaint.  The following portions of the patient's history were reviewed and updated as appropriate: allergies, current medications, past family history, past medical history, past social history, past surgical history and problem list.  ROS Otherwise as in subjective above  Objective: BP 130/70   Pulse 80   Temp 97.8 F (36.6 C) (Oral)   Ht 5\' 5"  (1.651 m)   Wt 139 lb (63 kg)   SpO2 98%   BMI 23.13 kg/m   General appearance: alert, no distress, well developed, well nourished Neck: supple, no lymphadenopathy, no thyromegaly, no masses Heart: RRR, normal S1, S2, no murmurs Tender right anterior lateral lower chest wall but no bruising no flow chest inspiration and expiration within normal other than she reports pain with inspiration Lungs: somewhat cautious breath effort,  otherwise CTA bilaterally, no wheezes, rhonchi, or rales Abdomen: +bs, soft, non tender, non distended, no masses, no hepatomegaly, no splenomegaly Back: non tender Pulses: 2+ radial pulses, 2+ pedal pulses, normal cap refill Ext: no edema Declines breast exam   Assessment: Encounter Diagnoses  Name Primary?  . Rib pain on right side Yes  . Fall against object   . Acute pain      Plan: We will send her for rib and chest x-ray.  Advised incentive spirometry, we will call the pharmacy to see if they will go ahead and refill her pain medicine sent last night.  Advised that rib fractures or rib contusions, assuming no flail chest or abnormality on x-ray, can take several weeks to fully resolve  I advised she follow-up with her gynecologist about mammogram results.  Marylon was seen today for chest pain.  Diagnoses and all orders for this visit:  Rib pain on right side -     DG Ribs Unilateral Right; Future -     DG Chest 2 View; Future  Fall against object -     DG Ribs Unilateral Right; Future -     DG Chest 2 View; Future  Acute pain -     DG Ribs Unilateral Right; Future -     DG Chest 2 View; Future    Follow up pending xray results

## 2018-09-22 NOTE — Telephone Encounter (Signed)
Spoke with patient's husband to let him know that she would need an appointment before we could order CXR.  He made appointment for today at 100 pm

## 2018-09-22 NOTE — Telephone Encounter (Signed)
Called central scheduling at [facility] to let them know that we did not order this breast ultra sound.

## 2018-09-30 ENCOUNTER — Other Ambulatory Visit: Payer: Self-pay | Admitting: Medical

## 2018-09-30 NOTE — Telephone Encounter (Signed)
[facility]. Please advise [facility] 

## 2018-10-04 ENCOUNTER — Other Ambulatory Visit: Payer: Self-pay | Admitting: Medical

## 2018-10-04 ENCOUNTER — Ambulatory Visit: Payer: BLUE CROSS/BLUE SHIELD | Admitting: Medical

## 2018-10-04 VITALS — BP 110/70 | HR 66 | Temp 98.1°F | Resp 16 | Ht 65.0 in | Wt 135.0 lb

## 2018-10-04 DIAGNOSIS — G4700 Insomnia, unspecified: Secondary | ICD-10-CM

## 2018-10-04 DIAGNOSIS — Z90710 Acquired absence of both cervix and uterus: Secondary | ICD-10-CM

## 2018-10-04 DIAGNOSIS — K219 Gastro-esophageal reflux disease without esophagitis: Secondary | ICD-10-CM | POA: Diagnosis not present

## 2018-10-04 DIAGNOSIS — S2249XD Multiple fractures of ribs, unspecified side, subsequent encounter for fracture with routine healing: Secondary | ICD-10-CM

## 2018-10-04 DIAGNOSIS — K589 Irritable bowel syndrome without diarrhea: Secondary | ICD-10-CM | POA: Diagnosis not present

## 2018-10-04 DIAGNOSIS — M545 Low back pain, unspecified: Secondary | ICD-10-CM

## 2018-10-04 DIAGNOSIS — E2839 Other primary ovarian failure: Secondary | ICD-10-CM

## 2018-10-04 DIAGNOSIS — Z79899 Other long term (current) drug therapy: Secondary | ICD-10-CM

## 2018-10-04 DIAGNOSIS — Z780 Asymptomatic menopausal state: Secondary | ICD-10-CM

## 2018-10-04 DIAGNOSIS — G8929 Other chronic pain: Secondary | ICD-10-CM

## 2018-10-04 DIAGNOSIS — F419 Anxiety disorder, unspecified: Secondary | ICD-10-CM

## 2018-10-04 DIAGNOSIS — K529 Noninfective gastroenteritis and colitis, unspecified: Secondary | ICD-10-CM | POA: Insufficient documentation

## 2018-10-04 DIAGNOSIS — E559 Vitamin D deficiency, unspecified: Secondary | ICD-10-CM | POA: Insufficient documentation

## 2018-10-04 MED ORDER — VITAMIN D 25 MCG (1000 UNIT) PO TABS: 1000.0000 [IU] | ORAL_TABLET | Freq: Every day | ORAL | 3 refills | Status: DC

## 2018-10-04 NOTE — Progress Notes (Signed)
Subjective:  Shelia Snyder is a 52 y.o. female who presents for Chief Complaint  Patient presents with  . follow up    follow up coughing up mucus gets winded     Here for f/u from recent rib fracture visit as well as med check.  I saw her 2 weeks ago for rib injury, rib fracture.  Improving.    IBS, chronic diarrhea - taking Viberzi 75mg  BID.  Works well for her, been on this just in past year since establishing here for care.   Last colonoscopy 2016.     GERD - takes Prilosec daily, can't go without.   Avoids GERD triggers.  Has had EGD in remote past.     No family hx/o esophagil, colon, stomach or other GI cancer  Vitamin D deficiency - still compliant with weekly 50,000 units.   Eats mostly fish and chicken regarding meat intake.   Walks for exercise, but not much weight bearing exercise.  No hx/o bone density test.  No family hx/o osteoporosis.  Has hx/o ankle fracture, recent rib fracture, wrist.    Has chronic back pain and depression - Compliant with Cymbalta 30mg  BID.  Taking Gabapentin TID.  Takes 1/2 tablet BID.  Insomnia - compliant with trazodone 50mg  daily for sleep, uses Xanax prn for both sleep and anxiety  She checked insurance about shingrix.  Doesn't think insurance covered this.  Td vaccine UTD this summer.  She reports being up to date on mammogram and gyn exam 2019 with [facility] in [place], [place].   No other aggravating or relieving factors. No other complaint.   Past Medical History:  Diagnosis Date  . Anxiety   . Depression   . Diarrhea   . Elevated liver enzymes   . IBS (irritable bowel syndrome) 09/04/2011  . Left leg pain    related to L4-L5 ruptured disc   Current Outpatient Medications on File Prior to Visit  Medication Sig Dispense Refill  . ALPRAZolam (XANAX) 1 MG tablet TAKE 1 TABLET (1 MG TOTAL) BY MOUTH AT BEDTIME AS NEEDED. FOR ANXIETY 30 tablet 2  . chlorhexidine (PERIDEX) 0.12 % solution     . DULoxetine (CYMBALTA) 30 MG capsule  Take 1 capsule (30 mg total) by mouth 2 (two) times daily. 60 capsule 2  . Eluxadoline (VIBERZI) 75 MG TABS Take 1 tablet by mouth 2 (two) times daily. 180 tablet 3  . gabapentin (NEURONTIN) 600 MG tablet Take 1 tablet (600 mg total) by mouth 3 (three) times daily. 270 tablet 3  . HYDROcodone-acetaminophen (NORCO) 10-325 MG tablet Take 1 tablet by mouth daily as needed. 30 tablet 0  . omeprazole (PRILOSEC) 40 MG capsule TAKE 1 CAPSULE BY MOUTH EVERY DAY 90 capsule 3  . traZODone (DESYREL) 50 MG tablet TAKE 1 TABLET BY MOUTH EVERYDAY AT BEDTIME 90 tablet 3   No current facility-administered medications on file prior to visit.    Past Surgical History:  Procedure Laterality Date  . BACK SURGERY  2000   L4-L5 discectomy  . BREAST LUMPECTOMY     benign, right  . CHOLECYSTECTOMY    . COLONOSCOPY  09/05/2011   Procedure: COLONOSCOPY;  Surgeon: Malissa Hippo, MD;  Location: [facility];  Service: Endoscopy;  Laterality: N/A;  . COLONOSCOPY  2016   melanosis coli, Dr. Jones Skene  . OOPHORECTOMY     right, abnormal bleeding after hysterectomy  . TOTAL ABDOMINAL HYSTERECTOMY     has 1 ovary, hx/o heavy  bleeding, s/p ablation   ROS as in subjective  The following portions of the patient's history were reviewed and updated as appropriate: allergies, current medications, past family history, past medical history, past social history, past surgical history and problem list.     Objective: BP 110/70   Pulse 66   Temp 98.1 F (36.7 C) (Oral)   Resp 16   Ht 5\' 5"  (1.651 m)   Wt 135 lb (61.2 kg)   SpO2 97%   BMI 22.47 kg/m   Wt Readings from Last 3 Encounters:  10/04/18 135 lb (61.2 kg)  09/22/18 139 lb (63 kg)  07/01/18 134 lb 3.2 oz (60.9 kg)    General appearance: alert, no distress, well developed, well nourished Neck: supple, no lymphadenopathy, no thyromegaly, no masses Heart: RRR, normal S1, S2, no murmurs Mildly tender right anterior lateral lower chest wall but  no bruising no flow chest inspiration and expiration within normal other than she reports pain with inspiration Lungs: CTA bilaterally, no wheezes, rhonchi, or rales Back: non tender Pulses: 2+ radial pulses, 2+ pedal pulses, normal cap refill Ext: no edema    Assessment: Encounter Diagnoses  Name Primary?  . Closed fracture of multiple ribs with routine healing, unspecified laterality, subsequent encounter Yes  . Gastroesophageal reflux disease, esophagitis presence not specified   . Irritable bowel syndrome, unspecified type   . Anxiety   . Chronic low back pain without sciatica, unspecified back pain laterality   . Estrogen deficiency   . Insomnia, unspecified type   . S/P hysterectomy   . Post-menopausal   . Chronic diarrhea   . High risk medication use   . Vitamin D deficiency      Plan: We reviewed her medications  Rib injury, rib fracture-much improved.  Reassured, and will gradually get better and better as the bones heal  IBS, chronic diarrhea -continue Viberzi 75mg  BID.  Reviewed last colonoscopy 2016 report.     GERD -continue Prilosec daily, avoid GERD triggers.  Had EGD in remote past.   No family hx/o esophagil, colon, stomach or other GI cancer  Vitamin D deficiency -change to 1000 units daily.  Continue good fish intake, good variety of fruits and vegetables and grains.   She has several risk factors for osteoporosis including history of fracture as an adult including a recent rib fracture, lean white female, postmenopausal.  I reminded her that she needs to go have the bone density test.  She will call today about appointment time.  Discussed need for weightbearing aerobic exercise.  Maximizing vitamin D supplement, getting proper calcium and healthy diet  Has chronic back pain and depression -continue Cymbalta 30mg  BID, Gabapentin TID.  Continue pain medicine hydrocodone 1/2 tablet BID.  No concerning findings with the PMP aware  Insomnia -continue trazodone  50mg  daily for sleep, uses Xanax prn for both sleep and anxiety   Shelia Snyder was seen today for follow up.  Diagnoses and all orders for this visit:  Closed fracture of multiple ribs with routine healing, unspecified laterality, subsequent encounter  Gastroesophageal reflux disease, esophagitis presence not specified  Irritable bowel syndrome, unspecified type  Anxiety  Chronic low back pain without sciatica, unspecified back pain laterality  Estrogen deficiency  Insomnia, unspecified type  S/P hysterectomy  Post-menopausal  Chronic diarrhea  High risk medication use  Vitamin D deficiency  Other orders -     cholecalciferol (VITAMIN D3) 25 MCG (1000 UT) tablet; Take 1 tablet (1,000 Units total) by mouth daily.  Follow up pending xray results

## 2018-10-21 ENCOUNTER — Other Ambulatory Visit: Payer: Self-pay | Admitting: Medical

## 2018-10-21 MED ORDER — HYDROCODONE-ACETAMINOPHEN 10-325 MG PO TABS: 1.0000 | ORAL_TABLET | Freq: Every day | ORAL | 0 refills | Status: DC | PRN

## 2018-10-25 ENCOUNTER — Telehealth: Payer: Self-pay | Admitting: Internal Medicine

## 2018-10-25 ENCOUNTER — Other Ambulatory Visit: Payer: Self-pay | Admitting: Medical

## 2018-10-25 NOTE — Telephone Encounter (Signed)
[facility] xanax 1mg  [facility] [place] [place]

## 2018-10-26 NOTE — Telephone Encounter (Signed)
Is this ok to refill?  

## 2018-10-27 ENCOUNTER — Telehealth: Payer: Self-pay | Admitting: Internal Medicine

## 2018-10-27 MED ORDER — ALPRAZOLAM 1 MG PO TABS: 1.0000 mg | ORAL_TABLET | Freq: Every evening | ORAL | 2 refills | Status: DC | PRN

## 2018-10-27 NOTE — Telephone Encounter (Signed)
Pt would like a refill on her xanax. This was sent to [facility] back on 12/10 but has not been done yet, please advise

## 2018-11-07 ENCOUNTER — Other Ambulatory Visit: Payer: BLUE CROSS/BLUE SHIELD

## 2018-11-22 ENCOUNTER — Other Ambulatory Visit: Payer: Self-pay | Admitting: Medical

## 2018-11-22 ENCOUNTER — Telehealth: Payer: Self-pay | Admitting: Medical

## 2018-11-22 MED ORDER — HYDROCODONE-ACETAMINOPHEN 10-325 MG PO TABS: 1.0000 | ORAL_TABLET | Freq: Every day | ORAL | 0 refills | Status: DC | PRN

## 2018-11-22 NOTE — Telephone Encounter (Signed)
She needed a prior authorization, they are sending that over now from the pharmacy,

## 2018-11-22 NOTE — Telephone Encounter (Signed)
I'll await prior auth

## 2018-11-22 NOTE — Telephone Encounter (Signed)
PA was done was for Gabapentin, per laura called and let pt know that RX was sent into the pharmacy, called pharmacy to rerun RX through,

## 2018-11-22 NOTE — Telephone Encounter (Signed)
Pt called and is requesting a refill on her Norco and her Gabepentin pt is out and would like [id number] [place], [place] - [address] AT [place] [address] pt can be reached at [phone]

## 2018-11-22 NOTE — Telephone Encounter (Signed)
I sent refill on the pain medicine but the gabapentin should have refills through June 2020

## 2018-11-23 ENCOUNTER — Telehealth: Payer: Self-pay | Admitting: Medical

## 2018-11-23 NOTE — Telephone Encounter (Signed)
done

## 2018-11-23 NOTE — Telephone Encounter (Signed)
P.A. GABAPENTIN completed and approved, pt and pharmacy informed

## 2018-12-15 ENCOUNTER — Other Ambulatory Visit: Payer: Self-pay | Admitting: Medical

## 2018-12-15 NOTE — Telephone Encounter (Signed)
Is this ok to refill?  

## 2018-12-22 ENCOUNTER — Telehealth: Payer: Self-pay | Admitting: Family Medicine

## 2018-12-22 ENCOUNTER — Other Ambulatory Visit: Payer: Self-pay | Admitting: Medical

## 2018-12-22 ENCOUNTER — Telehealth: Payer: Self-pay | Admitting: Medical

## 2018-12-22 MED ORDER — HYDROCODONE-ACETAMINOPHEN 10-325 MG PO TABS: 1.0000 | ORAL_TABLET | Freq: Every day | ORAL | 0 refills | Status: DC | PRN

## 2018-12-22 MED ORDER — ALPRAZOLAM 1 MG PO TABS: 1.0000 mg | ORAL_TABLET | Freq: Every evening | ORAL | 0 refills | Status: DC | PRN

## 2018-12-22 NOTE — Telephone Encounter (Signed)
Pt called and said [facility] out of the strength of hydrocodone that she uses the have some but said she would have to take 2 of the ones they have. [facility] told her to call and ask if u can refill it for 60 instead of 30.

## 2018-12-22 NOTE — Telephone Encounter (Signed)
Pt called for refill of Hydrocodone and Xanax to [facility] [place].

## 2018-12-23 ENCOUNTER — Other Ambulatory Visit: Payer: Self-pay | Admitting: Medical

## 2018-12-23 MED ORDER — HYDROCODONE-ACETAMINOPHEN 5-325 MG PO TABS: 2.0000 | ORAL_TABLET | Freq: Every day | ORAL | 0 refills | Status: DC

## 2018-12-23 NOTE — Telephone Encounter (Signed)
Pt called back about message that she left yesterday about her hydrocodone rx She was informed that power was out today and that Vincenza Hews had left when she called yesterday  Per pt her pharmacy does not have her strength of hydrocodone They have 5/325, she wants RX changed to #60 and changed for her to take 2 vs 1

## 2018-12-27 NOTE — Telephone Encounter (Signed)
P.A. Hydrocodone-acetaminophen

## 2019-01-02 ENCOUNTER — Encounter: Payer: Self-pay | Admitting: Family Medicine

## 2019-01-02 ENCOUNTER — Ambulatory Visit: Payer: BLUE CROSS/BLUE SHIELD | Admitting: Family Medicine

## 2019-01-02 VITALS — BP 120/80 | HR 80 | Ht 66.0 in | Wt 137.6 lb

## 2019-01-02 DIAGNOSIS — Z5181 Encounter for therapeutic drug level monitoring: Secondary | ICD-10-CM

## 2019-01-02 DIAGNOSIS — E559 Vitamin D deficiency, unspecified: Secondary | ICD-10-CM | POA: Diagnosis not present

## 2019-01-02 DIAGNOSIS — R252 Cramp and spasm: Secondary | ICD-10-CM | POA: Diagnosis not present

## 2019-01-02 DIAGNOSIS — M791 Myalgia, unspecified site: Secondary | ICD-10-CM

## 2019-01-02 NOTE — Patient Instructions (Signed)
Muscle Cramps and Spasms Muscle cramps and spasms are when muscles tighten by themselves. They usually get better within minutes. Muscle cramps are painful. They are usually stronger and last longer than muscle spasms. Muscle spasms may or may not be painful. They can last a few seconds or much longer. Cramps and spasms can affect any muscle, but they occur most often in the calf muscles of the leg. They are usually not caused by a serious problem. In many cases, the cause is not known. Some common causes include:  Doing more physical work or exercise than your body is ready for.  Using the muscles too much (overuse) by repeating certain movements too many times.  Staying in a certain position for a long time.  Playing a sport or doing an activity without preparing properly.  Using bad form or technique while playing a sport or doing an activity.  Not having enough water in your body (dehydration).  Injury.  Side effects of some medicines.  Low levels of the salts and minerals in your blood (electrolytes), such as low potassium or calcium. Follow these instructions at home: Managing pain and stiffness      Massage, stretch, and relax the muscle. Do this for many minutes at a time.  If told, put heat on tight or tense muscles as often as told by your doctor. Use the heat source that your doctor recommends, such as a moist heat pack or a heating pad. ? Place a towel between your skin and the heat source. ? Leave the heat on for 20-30 minutes. ? Remove the heat if your skin turns bright red. This is very important if you are not able to feel pain, heat, or cold. You may have a greater risk of getting burned.  If told, put ice on the affected area. This may help if you are sore or have pain after a cramp or spasm. ? Put ice in a plastic bag. ? Place a towel between your skin and the bag. ? Leave the ice on for 20 minutes, 2-3 times a day.  Try taking hot showers or baths to help  relax tight muscles. Eating and drinking  Drink enough fluid to keep your pee (urine) pale yellow.  Eat a healthy diet to help ensure that your muscles work well. This should include: ? Fruits and vegetables. ? Lean protein. ? Whole grains. ? Low-fat or nonfat dairy products. General instructions  If you are having cramps often, avoid intense exercise for several days.  Take over-the-counter and prescription medicines only as told by your doctor.  Watch for any changes in your symptoms.  Keep all follow-up visits as told by your doctor. This is important. Contact a doctor if:  Your cramps or spasms get worse or happen more often.  Your cramps or spasms do not get better with time. Summary  Muscle cramps and spasms are when muscles tighten by themselves. They usually get better within minutes.  Cramps and spasms occur most often in the calf muscles of the leg.  Massage, stretch, and relax the muscle. This may help the cramp or spasm go away.  Drink enough fluid to keep your pee (urine) pale yellow. This information is not intended to replace advice given to you by your health care provider. Make sure you discuss any questions you have with your health care provider. Document Released: 10/15/2008 Document Revised: 03/28/2018 Document Reviewed: 03/28/2018 Elsevier Interactive Patient Education  2019 Elsevier Inc.  

## 2019-01-02 NOTE — Progress Notes (Signed)
Chief Complaint  Patient presents with  . Leg cramps    for the last two weeks. Has tried tomato juice and gatorade but not helping. Legs are so sore from the cramping that she can barely touch them. Does have known vit d def and had been taking her vit d supplement. Does not feel that she is dehydrated.    She is having leg cramps so bad that her muscles are sore.  Sometimes it has been both legs, sometimes just one.  It is the entire leg, starting in the calves and up to the thighs.  It has been going on for 2 weeks every night.  Getting up and walking is the only thing that helps. Also occurs during the day.  Never had this in the past--reminiscent of when she exercised hard in the past like racing 5K's.  H/o vitamin D deficiency.  Taking 1000 IU daily, thinks she is due for recheck soon. She was treated with rx replacement after last check 6 months ago, and taking 1000 IU daily since.  Chronic IBS--takes Viberzi and stomach has been "a lot better", no changes recently.  3 stools/day.  Eats 1 banana daily and Naked Smoothies twice daily.  PMH, PSH, SH reviewed in detail  Outpatient Encounter Medications as of 01/02/2019  Medication Sig  . ALPRAZolam (XANAX) 1 MG tablet Take 1 tablet (1 mg total) by mouth at bedtime as needed. For anxiety  . chlorhexidine (PERIDEX) 0.12 % solution   . cholecalciferol (VITAMIN D3) 25 MCG (1000 UT) tablet Take 1 tablet (1,000 Units total) by mouth daily.  . DULoxetine (CYMBALTA) 30 MG capsule Take 1 capsule (30 mg total) by mouth 2 (two) times daily.  . Eluxadoline (VIBERZI) 75 MG TABS Take 1 tablet by mouth 2 (two) times daily.  Marland Kitchen gabapentin (NEURONTIN) 600 MG tablet Take 1 tablet (600 mg total) by mouth 3 (three) times daily.  Marland Kitchen HYDROcodone-acetaminophen (NORCO) 10-325 MG tablet Take 1 tablet by mouth daily as needed.  Marland Kitchen omeprazole (PRILOSEC) 40 MG capsule TAKE 1 CAPSULE BY MOUTH EVERY DAY  . traZODone (DESYREL) 50 MG tablet TAKE 1 TABLET BY MOUTH EVERYDAY  AT BEDTIME  . [DISCONTINUED] ALPRAZolam (XANAX) 1 MG tablet Take 1 tablet (1 mg total) by mouth at bedtime as needed. For anxiety  . [DISCONTINUED] HYDROcodone-acetaminophen (NORCO/VICODIN) 5-325 MG tablet Take 2 tablets by mouth daily.   No facility-administered encounter medications on file as of 01/02/2019.     Allergies  Allergen Reactions  . Latex   . Sulfa Antibiotics    ROS: no fever, chills, URI symptoms. Chest pain, swelling, shortness of breath.  No nausea, vomiting.  +chronic diarrhea, down to 3x/d with current med regimen.  No bleeding, bruising.  +leg cramps and sore muscles per HPI. No urinary complaints  PHYSICAL EXAM:  BP 120/80   Pulse 80   Ht '5\' 6"'  (1.676 m)   Wt 137 lb 9.6 oz (62.4 kg)   BMI 22.21 kg/m   Pleasant, well-appearing, thin female in no distress. Appears slightly pale. HEENT: conjunctiva and sclera are clear, EOMI Neck: no lymphadenopathy, thyromegaly or mass Heart: regular rate and rhythm Lungs: clear bilaterally Back: no spinal or CVA tenderness Abdomen: soft, nontender, no mass Extremities:  2+ pulses, no edema Diffuse mild tenderness over both calves and posterior thighs.  No edema, no cords. Skin: normal turgor, no rash Psych: normal mood, affect, hygiene and grooming  ASSESSMENT/PLAN:   Muscle cramps - electrolyte abnl risks due to diarrhea, PPI  use. Check labs - Plan: Magnesium, CBC with Differential/Platelet, Comprehensive metabolic panel, VITAMIN D 25 Hydroxy (Vit-D Deficiency, Fractures), CK  Vitamin D deficiency - Plan: VITAMIN D 25 Hydroxy (Vit-D Deficiency, Fractures)  Medication monitoring encounter - Plan: Magnesium, CBC with Differential/Platelet, Comprehensive metabolic panel, VITAMIN D 25 Hydroxy (Vit-D Deficiency, Fractures), CK  Myalgia - sore muscles s/p cramping   c-met, Mg, vit D, CBC, CPK  Difficult stick noted  [facility]

## 2019-01-03 LAB — CBC WITH DIFFERENTIAL/PLATELET
Basophils Absolute: 0.1 10*3/uL (ref 0.0–0.2)
Basos: 1 %
EOS (ABSOLUTE): 0.3 10*3/uL (ref 0.0–0.4)
Eos: 5 %
Hematocrit: 41.2 % (ref 34.0–46.6)
Hemoglobin: 14 g/dL (ref 11.1–15.9)
IMMATURE GRANS (ABS): 0 10*3/uL (ref 0.0–0.1)
IMMATURE GRANULOCYTES: 0 %
Lymphocytes Absolute: 2 10*3/uL (ref 0.7–3.1)
Lymphs: 27 %
MCH: 30.5 pg (ref 26.6–33.0)
MCHC: 34 g/dL (ref 31.5–35.7)
MCV: 90 fL (ref 79–97)
MONOS ABS: 0.6 10*3/uL (ref 0.1–0.9)
Monocytes: 8 %
NEUTROS PCT: 59 %
Neutrophils Absolute: 4.2 10*3/uL (ref 1.4–7.0)
PLATELETS: 289 10*3/uL (ref 150–450)
RBC: 4.59 x10E6/uL (ref 3.77–5.28)
RDW: 12.3 % (ref 11.7–15.4)
WBC: 7.2 10*3/uL (ref 3.4–10.8)

## 2019-01-03 LAB — COMPREHENSIVE METABOLIC PANEL
A/G RATIO: 1.7 (ref 1.2–2.2)
ALK PHOS: 78 IU/L (ref 39–117)
ALT: 19 IU/L (ref 0–32)
AST: 20 IU/L (ref 0–40)
Albumin: 4.5 g/dL (ref 3.8–4.9)
BUN/Creatinine Ratio: 9 (ref 9–23)
BUN: 7 mg/dL (ref 6–24)
Bilirubin Total: 0.4 mg/dL (ref 0.0–1.2)
CO2: 25 mmol/L (ref 20–29)
Calcium: 9.8 mg/dL (ref 8.7–10.2)
Chloride: 100 mmol/L (ref 96–106)
Creatinine, Ser: 0.8 mg/dL (ref 0.57–1.00)
GFR calc Af Amer: 96 mL/min/{1.73_m2} (ref >59)
GFR calc non Af Amer: 83 mL/min/{1.73_m2} (ref >59)
Globulin, Total: 2.6 g/dL (ref 1.5–4.5)
Glucose: 84 mg/dL (ref 65–99)
POTASSIUM: 4.1 mmol/L (ref 3.5–5.2)
Sodium: 140 mmol/L (ref 134–144)
Total Protein: 7.1 g/dL (ref 6.0–8.5)

## 2019-01-03 LAB — CK: Total CK: 156 U/L (ref 24–173)

## 2019-01-03 LAB — MAGNESIUM: Magnesium: 2.3 mg/dL (ref 1.6–2.3)

## 2019-01-03 LAB — VITAMIN D 25 HYDROXY (VIT D DEFICIENCY, FRACTURES): VIT D 25 HYDROXY: 26.9 ng/mL — AB (ref 30.0–100.0)

## 2019-01-04 ENCOUNTER — Ambulatory Visit: Payer: BLUE CROSS/BLUE SHIELD | Admitting: Medical

## 2019-01-04 ENCOUNTER — Encounter: Payer: Self-pay | Admitting: Medical

## 2019-01-04 VITALS — BP 110/70 | HR 84 | Temp 98.4°F | Resp 16 | Ht 65.0 in | Wt 135.2 lb

## 2019-01-04 DIAGNOSIS — K589 Irritable bowel syndrome without diarrhea: Secondary | ICD-10-CM

## 2019-01-04 DIAGNOSIS — M545 Low back pain, unspecified: Secondary | ICD-10-CM

## 2019-01-04 DIAGNOSIS — K219 Gastro-esophageal reflux disease without esophagitis: Secondary | ICD-10-CM | POA: Diagnosis not present

## 2019-01-04 DIAGNOSIS — R252 Cramp and spasm: Secondary | ICD-10-CM

## 2019-01-04 DIAGNOSIS — G8929 Other chronic pain: Secondary | ICD-10-CM

## 2019-01-04 DIAGNOSIS — Z90710 Acquired absence of both cervix and uterus: Secondary | ICD-10-CM

## 2019-01-04 DIAGNOSIS — K529 Noninfective gastroenteritis and colitis, unspecified: Secondary | ICD-10-CM | POA: Diagnosis not present

## 2019-01-04 DIAGNOSIS — F419 Anxiety disorder, unspecified: Secondary | ICD-10-CM

## 2019-01-04 DIAGNOSIS — E2839 Other primary ovarian failure: Secondary | ICD-10-CM

## 2019-01-04 DIAGNOSIS — Z7185 Encounter for immunization safety counseling: Secondary | ICD-10-CM

## 2019-01-04 DIAGNOSIS — E559 Vitamin D deficiency, unspecified: Secondary | ICD-10-CM

## 2019-01-04 DIAGNOSIS — Z7189 Other specified counseling: Secondary | ICD-10-CM

## 2019-01-04 DIAGNOSIS — Z780 Asymptomatic menopausal state: Secondary | ICD-10-CM

## 2019-01-04 DIAGNOSIS — Z79899 Other long term (current) drug therapy: Secondary | ICD-10-CM

## 2019-01-04 DIAGNOSIS — G4700 Insomnia, unspecified: Secondary | ICD-10-CM

## 2019-01-04 MED ORDER — DULOXETINE HCL 30 MG PO CPEP: 30.0000 mg | ORAL_CAPSULE | Freq: Two times a day (BID) | ORAL | 2 refills | Status: DC

## 2019-01-04 NOTE — Progress Notes (Signed)
Subjective:     Patient ID: Shelia Snyder, female   DOB: 1966/09/07, 53 y.o.   MRN: [id number]  HPI Chief Complaint  Patient presents with  . follow up    3 month follow up, leg cramps worse   Here for 29-month med check given controlled substances prescribed and routine follow-up.  She was just seen here recently on 01/02/19, 2 days ago with Dr. Lynelle Doctor for leg cramps.  She had labs done that day which we will review.  She still reports leg cramps.  Both legs throughout feel sore to touch, feet and toes cramp up, but no redness, no bruising.   Swim twice weekly but no other new activity or strenuous activity.  Cramping x 2 weeks.    Depression anxiety- taking Cymbalta 30 mg twice daily, Xanax as needed, uses trazodone for sleep  Chronic pain-compliant with gabapentin 600 mg 3 times daily, hydrocodone Norco typically once daily.  GERD-compliant with Prilosec 40 mg daily.  Taking vitamin D 1000 units daily  Didn't get bone density yet as her father was recently in hospitla.   She plans to get this soon.   Past Medical History:  Diagnosis Date  . Anxiety   . Depression   . Diarrhea   . Elevated liver enzymes   . IBS (irritable bowel syndrome) 09/04/2011  . Left leg pain    related to L4-L5 ruptured disc   Current Outpatient Medications on File Prior to Visit  Medication Sig Dispense Refill  . ALPRAZolam (XANAX) 1 MG tablet Take 1 tablet (1 mg total) by mouth at bedtime as needed. For anxiety 30 tablet 0  . chlorhexidine (PERIDEX) 0.12 % solution     . cholecalciferol (VITAMIN D3) 25 MCG (1000 UT) tablet Take 1 tablet (1,000 Units total) by mouth daily. 90 tablet 3  . Eluxadoline (VIBERZI) 75 MG TABS Take 1 tablet by mouth 2 (two) times daily. 180 tablet 3  . gabapentin (NEURONTIN) 600 MG tablet Take 1 tablet (600 mg total) by mouth 3 (three) times daily. 270 tablet 3  . HYDROcodone-acetaminophen (NORCO) 10-325 MG tablet Take 1 tablet by mouth daily as needed. 30 tablet 0  .  omeprazole (PRILOSEC) 40 MG capsule TAKE 1 CAPSULE BY MOUTH EVERY DAY 90 capsule 3  . traZODone (DESYREL) 50 MG tablet TAKE 1 TABLET BY MOUTH EVERYDAY AT BEDTIME 90 tablet 0   No current facility-administered medications on file prior to visit.    Review of Systems ROS as in subjective      Objective:   Physical Exam BP 110/70   Pulse 84   Temp 98.4 F (36.9 C) (Oral)   Resp 16   Ht 5\' 5"  (1.651 m)   Wt 135 lb 3.2 oz (61.3 kg)   SpO2 95%   BMI 22.50 kg/m   Wt Readings from Last 3 Encounters:  01/04/19 135 lb 3.2 oz (61.3 kg)  01/02/19 137 lb 9.6 oz (62.4 kg)  10/04/18 135 lb (61.2 kg)   General appearance: alert, no distress, WD/WN,  2+ pedal pulses, normal capillary refill upper and lower extremities No lower extremity edema no obvious varicose veins or palpable cords, she does have some scattered spider veins in her lower extremities She seems mildly tender throughout her legs but no obvious deformity, mild pain with straight leg raise bilaterally, otherwise legs without obvious swelling or deformity Back without obvious tenderness today Neuro: She notes some pain with heel and toe walk today, DTRs normal lower extremities, normal leg  strength Skin no obvious erythema or bruising      Assessment:     Encounter Diagnoses  Name Primary?  . Chronic diarrhea Yes  . Irritable bowel syndrome, unspecified type   . Gastroesophageal reflux disease, esophagitis presence not specified   . Anxiety   . Chronic low back pain without sciatica, unspecified back pain laterality   . Estrogen deficiency   . High risk medication use   . Insomnia, unspecified type   . Post-menopausal   . S/P hysterectomy   . Vitamin D deficiency   . Vaccine counseling   . Leg cramps       Plan:     Chronic diarrhea, IBS-doing fine on current regimen with Viberzi  GERD- discussed risk benefits of Prilosec, continue GERD trigger avoidance  Chronic low back pain-continue sparing use of Norco,  gabapentin.  Leg cramps-reviewed her recent labs that was done this week which were all normal.  Advised some other remedies she can try over the next week.  If not improving over the next 2 weeks it may be time for updated MRI lumbar spine for both chronic back pain as well as her unusual pains in her legs with no obvious cause  Shingles vaccine:  I recommend you have a shingles vaccine to help prevent shingles or herpes zoster outbreak.   Please call your insurer to inquire about coverage for the Shingrix vaccine given in 2 doses.   Some insurers cover this vaccine after age 48, some cover this after age 85.  If your insurer covers this, then call to schedule appointment to have this vaccine here.  She will follow-up soon to get her bone density scan as requested  She signed again today to get copies of her last mammogram Pap smear which she says is up-to-date within the last year  Insomnia-continue trazodone  Vit D deficiency-continue vitamin D supplement

## 2019-01-04 NOTE — Telephone Encounter (Signed)
P.A. denied, coverage is provided where documentation confirms there is a pain management agreement signed by patient and provider, also a controlled substance report reviewed in past 90 days, and a treatment plan signed by patient and provider including goals, monitoring and drug testing agreement.  Without this coverage continues with 30 day supply every 90 day limit.  Left message for pt.  Do you want me to schedule an appointment for her to come in to discuss?

## 2019-01-04 NOTE — Patient Instructions (Signed)
Recommendations:  Cramps  Try 1 cup of tonic water twice daily  If this doesn't help, try 1tbsp mustard daily  Another option is over the counter Magnesium + potassium supplement.  sometimes this helps, but your recent magnesium and potassium were normal  Get a variety of fruits, vegetables, and water in the diet  If not improving over the next 2 weeks, the next steps would be either updated MRI lumbar spine or neurology consult for other evaluation or testing  Screenings and preventative care  Schedule your bone density screening for osteoporosis  We will request copies of last mammogram and pap as we still havent gotten up to date screenings on file  Shingles vaccine:  I recommend you have a shingles vaccine to help prevent shingles or herpes zoster outbreak.   Please call your insurer to inquire about coverage for the Shingrix vaccine given in 2 doses.   Some insurers cover this vaccine after age 42, some cover this after age 57.  If your insurer covers this, then call to schedule appointment to have this vaccine here.

## 2019-01-13 ENCOUNTER — Other Ambulatory Visit: Payer: Self-pay | Admitting: Medical

## 2019-01-13 NOTE — Telephone Encounter (Signed)
This was filled on 04/2018 #270 with 3 refills (a year supply)

## 2019-01-18 ENCOUNTER — Telehealth: Payer: Self-pay | Admitting: Medical

## 2019-01-18 NOTE — Telephone Encounter (Signed)
Per pt instructions medical record request sent to [facility] in [place]. Received a fax back stating no pt matching name and DOB.

## 2019-01-23 ENCOUNTER — Telehealth: Payer: Self-pay | Admitting: Medical

## 2019-01-23 ENCOUNTER — Other Ambulatory Visit: Payer: Self-pay | Admitting: Medical

## 2019-01-23 MED ORDER — ALPRAZOLAM 1 MG PO TABS: 1.0000 mg | ORAL_TABLET | Freq: Every evening | ORAL | 0 refills | Status: DC | PRN

## 2019-01-23 NOTE — Telephone Encounter (Signed)
done

## 2019-01-23 NOTE — Telephone Encounter (Signed)
Pt needs refill Xanax to [facility]

## 2019-01-24 ENCOUNTER — Other Ambulatory Visit: Payer: Self-pay | Admitting: Medical

## 2019-01-24 MED ORDER — HYDROCODONE-ACETAMINOPHEN 10-325 MG PO TABS: 1.0000 | ORAL_TABLET | Freq: Every day | ORAL | 0 refills | Status: DC | PRN

## 2019-01-24 NOTE — Telephone Encounter (Signed)
Mail her the pain management agreement to print and sign her name and date.  This needs to be returned by her ASAP  Please print an updated [employer] Controlled Report on her  Next appt needs to be close to 04/02/2019 for pain medication recheck, q79mo

## 2019-02-21 ENCOUNTER — Encounter: Payer: Self-pay | Admitting: Medical

## 2019-02-21 NOTE — Telephone Encounter (Signed)
Appeal letter typed, all documents requested were faxed into [employer] for appeal

## 2019-02-22 ENCOUNTER — Other Ambulatory Visit: Payer: Self-pay | Admitting: Medical

## 2019-02-22 ENCOUNTER — Telehealth: Payer: Self-pay | Admitting: Medical

## 2019-02-22 MED ORDER — TRAZODONE HCL 50 MG PO TABS: 50.0000 mg | ORAL_TABLET | Freq: Every day | ORAL | 3 refills | Status: DC

## 2019-02-22 MED ORDER — ALPRAZOLAM 1 MG PO TABS: 1.0000 mg | ORAL_TABLET | Freq: Every evening | ORAL | 0 refills | Status: DC | PRN

## 2019-02-22 NOTE — Telephone Encounter (Signed)
Pt has an appt in May. She is requesting refills on trazodone and xanax. Pt uses [facility] in [place] and can be reached at [phone].

## 2019-03-01 ENCOUNTER — Other Ambulatory Visit: Payer: Self-pay | Admitting: Medical

## 2019-03-01 ENCOUNTER — Telehealth: Payer: Self-pay | Admitting: Medical

## 2019-03-01 MED ORDER — HYDROCODONE-ACETAMINOPHEN 10-325 MG PO TABS: 1.0000 | ORAL_TABLET | Freq: Every day | ORAL | 0 refills | Status: DC | PRN

## 2019-03-01 NOTE — Telephone Encounter (Signed)
Pt called for refills of Norco.  Please send to [facility] in [place]. Pt can be reached at [phone].

## 2019-03-09 ENCOUNTER — Telehealth: Payer: Self-pay | Admitting: Medical

## 2019-03-09 ENCOUNTER — Other Ambulatory Visit: Payer: Self-pay | Admitting: Medical

## 2019-03-09 NOTE — Telephone Encounter (Signed)
Received requested records from [facility]. Sending back for review.

## 2019-03-09 NOTE — Telephone Encounter (Signed)
Is this ok to refill?  

## 2019-03-24 ENCOUNTER — Other Ambulatory Visit: Payer: Self-pay | Admitting: Medical

## 2019-03-24 NOTE — Telephone Encounter (Signed)
Is this ok to refill?  

## 2019-03-30 ENCOUNTER — Other Ambulatory Visit: Payer: Self-pay | Admitting: Medical

## 2019-03-30 ENCOUNTER — Telehealth: Payer: Self-pay | Admitting: Medical

## 2019-03-30 MED ORDER — HYDROCODONE-ACETAMINOPHEN 10-325 MG PO TABS: 1.0000 | ORAL_TABLET | Freq: Every day | ORAL | 0 refills | Status: DC | PRN

## 2019-03-30 NOTE — Telephone Encounter (Signed)
Pt called and is requesting a refill on her Norco please send to [facility] [phone] - [place], [place] - [address] AT [facility] [address]

## 2019-03-30 NOTE — Telephone Encounter (Signed)
Med sent, but needs virtual med check next week

## 2019-03-30 NOTE — Telephone Encounter (Signed)
Pt is on schedule for 05/20 tried to call to let her know that rx was sent but VM was full

## 2019-04-04 NOTE — Telephone Encounter (Signed)
Never heard back from appeal, refaxed all documents

## 2019-04-05 ENCOUNTER — Ambulatory Visit: Payer: BLUE CROSS/BLUE SHIELD | Admitting: Medical

## 2019-04-05 ENCOUNTER — Encounter: Payer: Self-pay | Admitting: Medical

## 2019-04-24 ENCOUNTER — Other Ambulatory Visit: Payer: Self-pay | Admitting: Medical

## 2019-04-24 NOTE — Telephone Encounter (Signed)
Is this ok to refill?  

## 2019-04-25 NOTE — Telephone Encounter (Signed)
Patient scheduled an appointment for 04-27-19.   She wanted to know if you could refill her Xanax since she made an appointment.

## 2019-04-27 ENCOUNTER — Ambulatory Visit: Payer: BC Managed Care – PPO | Admitting: Medical

## 2019-04-27 ENCOUNTER — Other Ambulatory Visit: Payer: Self-pay

## 2019-04-27 ENCOUNTER — Encounter: Payer: Self-pay | Admitting: Medical

## 2019-04-27 VITALS — BP 130/80 | HR 103 | Temp 98.4°F | Wt 138.0 lb

## 2019-04-27 DIAGNOSIS — M545 Low back pain: Secondary | ICD-10-CM

## 2019-04-27 DIAGNOSIS — Z7185 Encounter for immunization safety counseling: Secondary | ICD-10-CM

## 2019-04-27 DIAGNOSIS — Z79899 Other long term (current) drug therapy: Secondary | ICD-10-CM | POA: Diagnosis not present

## 2019-04-27 DIAGNOSIS — K589 Irritable bowel syndrome without diarrhea: Secondary | ICD-10-CM

## 2019-04-27 DIAGNOSIS — G8929 Other chronic pain: Secondary | ICD-10-CM

## 2019-04-27 DIAGNOSIS — Z780 Asymptomatic menopausal state: Secondary | ICD-10-CM | POA: Diagnosis not present

## 2019-04-27 DIAGNOSIS — E559 Vitamin D deficiency, unspecified: Secondary | ICD-10-CM

## 2019-04-27 DIAGNOSIS — Z7189 Other specified counseling: Secondary | ICD-10-CM

## 2019-04-27 DIAGNOSIS — E2839 Other primary ovarian failure: Secondary | ICD-10-CM | POA: Diagnosis not present

## 2019-04-27 DIAGNOSIS — K219 Gastro-esophageal reflux disease without esophagitis: Secondary | ICD-10-CM

## 2019-04-27 MED ORDER — VITAMIN D 25 MCG (1000 UNIT) PO TABS: 2000.0000 [IU] | ORAL_TABLET | Freq: Every day | ORAL | 3 refills | Status: DC

## 2019-04-27 MED ORDER — GABAPENTIN 600 MG PO TABS: 600.0000 mg | ORAL_TABLET | Freq: Three times a day (TID) | ORAL | 3 refills | Status: DC

## 2019-04-27 MED ORDER — HYDROCODONE-ACETAMINOPHEN 10-325 MG PO TABS: 1.0000 | ORAL_TABLET | Freq: Every day | ORAL | 0 refills | Status: DC | PRN

## 2019-04-27 MED ORDER — ALPRAZOLAM 1 MG PO TABS: ORAL_TABLET | ORAL | 2 refills | Status: DC

## 2019-04-27 MED ORDER — OMEPRAZOLE 40 MG PO CPDR: DELAYED_RELEASE_CAPSULE | ORAL | 3 refills | Status: DC

## 2019-04-27 MED ORDER — DULOXETINE HCL 30 MG PO CPEP: 30.0000 mg | ORAL_CAPSULE | Freq: Two times a day (BID) | ORAL | 1 refills | Status: DC

## 2019-04-27 MED ORDER — VIBERZI 75 MG PO TABS: 1.0000 | ORAL_TABLET | Freq: Two times a day (BID) | ORAL | 3 refills | Status: DC

## 2019-04-27 NOTE — Patient Instructions (Signed)
Please call to schedule your bone density test   [facility]  [phone] [address], [place], [place] [address]    Please go to [place] for your lumbar xray.   Their hours are 8am - 4:30 pm Monday - Friday.  Take your insurance card with you.  [facility] [phone]  [place] [address], [place], [place] [address]  [address] [place], [place] [address]

## 2019-04-27 NOTE — Progress Notes (Signed)
Subjective:     Patient ID: Shelia Snyder, female   DOB: 1966/09/28, 53 y.o.   MRN: [id number]  HPI Chief Complaint  Patient presents with  . med check    med check. no other concerns   Here for 19-month med check and routine follow-up.   Depression and anxiety, insomnia - taking Cymbalta 30 mg twice daily, Xanax typically once daily, uses trazodone for sleep prn.   Been out of xanax a week since we declined refill as she inadvertently missed her appt last week.    Chronic pain-compliant with gabapentin 600 mg 3 times daily, hydrocodone Norco typically once daily.  Walks and does Yoga for exercise.  GERD-compliant with Prilosec 40 mg daily.  Taking vitamin D 1000 units daily  Didn't get bone density yet as her father was recently in hospital.   She plans to get this soon.  She continues Viberzi prn for IBS diarrhea.  No other new c/o.      Past Medical History:  Diagnosis Date  . Anxiety   . Depression   . Diarrhea   . Elevated liver enzymes   . IBS (irritable bowel syndrome) 09/04/2011  . Left leg pain    related to L4-L5 ruptured disc   Current Outpatient Medications on File Prior to Visit  Medication Sig Dispense Refill  . chlorhexidine (PERIDEX) 0.12 % solution     . traZODone (DESYREL) 50 MG tablet Take 1 tablet (50 mg total) by mouth at bedtime. 90 tablet 3   No current facility-administered medications on file prior to visit.    Review of Systems ROS as in subjective      Objective:   Physical Exam BP 130/80   Pulse (!) 103   Temp 98.4 F (36.9 C) (Oral)   Wt 138 lb (62.6 kg)   SpO2 99%   BMI 22.96 kg/m   Wt Readings from Last 3 Encounters:  04/27/19 138 lb (62.6 kg)  01/04/19 135 lb 3.2 oz (61.3 kg)  01/02/19 137 lb 9.6 oz (62.4 kg)   General appearance: alert, no distress, WD/WN,  Lungs clear, no rhonchi, no rales, no wheezing Heart: rrr, normal s1, s2, no murmurs 2+ pulses UE and LE Back without obvious tenderness today, relatively normal  ROM Neuro: left leg with 4-5/5 strength, DTRs 2+ throughout, otherwise neuro intact, non focal exam otherwise       Assessment:     Encounter Diagnoses  Name Primary?  . Estrogen deficiency Yes  . Chronic low back pain without sciatica, unspecified back pain laterality   . High risk medication use   . Post-menopausal   . Vitamin D deficiency   . Gastroesophageal reflux disease, esophagitis presence not specified   . Irritable bowel syndrome, unspecified type   . Vaccine counseling       Plan:     Estrogen deficiency, postmenopausal, vitamin D deficiency-advise she go get her bone density scan within the next month  Chronic diarrhea, IBS-doing fine on current regimen with Viberzi  GERD- discussed risk benefits of Prilosec, continue GERD trigger avoidance  Chronic low back pain-continue current dosing of Norco, gabapentin.  Advise she go ahead and get updated lumbar spine x-ray.  The last lumbar spine imaging was lumbar spine MRIs close to 10 years ago.  Shingles vaccine:  I recommend you have a shingles vaccine to help prevent shingles or herpes zoster outbreak.   Please call your insurer to inquire about coverage for the Shingrix vaccine given in 2 doses.  Some insurers cover this vaccine after age 55, some cover this after age 55.  If your insurer covers this, then call to schedule appointment to have this vaccine here.  She will follow-up soon to get her bone density scan as requested  Insomnia-continue trazodone  Vit D deficiency-continue vitamin D supplement   Shelia Snyder was seen today for med check.  Diagnoses and all orders for this visit:  Estrogen deficiency  Chronic low back pain without sciatica, unspecified back pain laterality  High risk medication use  Post-menopausal  Vitamin D deficiency  Gastroesophageal reflux disease, esophagitis presence not specified  Irritable bowel syndrome, unspecified type  Vaccine counseling  Other orders -     omeprazole  (PRILOSEC) 40 MG capsule; TAKE 1 CAPSULE BY MOUTH EVERY DAY -     HYDROcodone-acetaminophen (NORCO) 10-325 MG tablet; Take 1 tablet by mouth daily as needed. -     gabapentin (NEURONTIN) 600 MG tablet; Take 1 tablet (600 mg total) by mouth 3 (three) times daily. -     Eluxadoline (VIBERZI) 75 MG TABS; Take 1 tablet by mouth 2 (two) times daily. -     DULoxetine (CYMBALTA) 30 MG capsule; Take 1 capsule (30 mg total) by mouth 2 (two) times daily. -     cholecalciferol (VITAMIN D3) 25 MCG (1000 UT) tablet; Take 2 tablets (2,000 Units total) by mouth daily. -     ALPRAZolam (XANAX) 1 MG tablet; TAKE 1 TABLET(1 MG) BY MOUTH AT BEDTIME AS NEEDED FOR ANXIETY

## 2019-05-11 NOTE — Telephone Encounter (Signed)
[employer] [place] refaxed decision to appeal and it was denied. I called pharmacy and with discount card Rx is only $6.81 so she is paying out of pocket

## 2019-05-26 ENCOUNTER — Other Ambulatory Visit: Payer: Self-pay | Admitting: Medical

## 2019-05-26 ENCOUNTER — Telehealth: Payer: Self-pay | Admitting: Medical

## 2019-05-26 MED ORDER — HYDROCODONE-ACETAMINOPHEN 10-325 MG PO TABS: 1.0000 | ORAL_TABLET | Freq: Every day | ORAL | 0 refills | Status: DC | PRN

## 2019-05-26 NOTE — Telephone Encounter (Signed)
  Needs refill on hydrocodone sent to [facility]

## 2019-06-14 ENCOUNTER — Telehealth: Payer: Self-pay | Admitting: Medical

## 2019-06-14 NOTE — Telephone Encounter (Signed)
Pt called and states that she needs a order to get a bone density at the breast center, states the one she has Is old pt can be reached at [phone]

## 2019-06-15 ENCOUNTER — Other Ambulatory Visit: Payer: Self-pay | Admitting: Medical

## 2019-06-15 ENCOUNTER — Ambulatory Visit
Admission: RE | Admit: 2019-06-15 | Discharge: 2019-06-15 | Disposition: A | Discharge status: Home / Self Care | Payer: BC Managed Care – PPO | Source: Ambulatory Visit | Attending: Medical | Admitting: Medical

## 2019-06-15 ENCOUNTER — Encounter: Payer: Self-pay | Admitting: Medical

## 2019-06-15 DIAGNOSIS — M545 Low back pain, unspecified: Secondary | ICD-10-CM

## 2019-06-15 DIAGNOSIS — E2839 Other primary ovarian failure: Secondary | ICD-10-CM

## 2019-06-16 NOTE — Telephone Encounter (Signed)
Please put order on script pad and I'll sign

## 2019-06-17 DIAGNOSIS — Z79899 Other long term (current) drug therapy: Secondary | ICD-10-CM | POA: Diagnosis not present

## 2019-06-17 DIAGNOSIS — R1011 Right upper quadrant pain: Secondary | ICD-10-CM | POA: Diagnosis not present

## 2019-06-17 DIAGNOSIS — R1013 Epigastric pain: Secondary | ICD-10-CM | POA: Diagnosis not present

## 2019-06-17 DIAGNOSIS — R110 Nausea: Secondary | ICD-10-CM | POA: Diagnosis not present

## 2019-06-17 DIAGNOSIS — M549 Dorsalgia, unspecified: Secondary | ICD-10-CM | POA: Diagnosis not present

## 2019-06-17 DIAGNOSIS — Z9049 Acquired absence of other specified parts of digestive tract: Secondary | ICD-10-CM | POA: Diagnosis not present

## 2019-06-17 DIAGNOSIS — R1031 Right lower quadrant pain: Secondary | ICD-10-CM | POA: Diagnosis not present

## 2019-06-23 ENCOUNTER — Other Ambulatory Visit: Payer: Self-pay | Admitting: Medical

## 2019-06-23 ENCOUNTER — Telehealth: Payer: Self-pay | Admitting: Medical

## 2019-06-23 MED ORDER — HYDROCODONE-ACETAMINOPHEN 10-325 MG PO TABS: 1.0000 | ORAL_TABLET | Freq: Every day | ORAL | 0 refills | Status: DC | PRN

## 2019-06-23 NOTE — Telephone Encounter (Signed)
Pt called and is requesting a refill on her [facility] pt would like it sent to the [facility] [phone] - [place], [place] - [address] AT [address]

## 2019-07-19 ENCOUNTER — Telehealth: Payer: Self-pay | Admitting: Medical

## 2019-07-19 NOTE — Telephone Encounter (Signed)
Pt called for refills of Xanax and Hydrocodone. Please send to [facility] in [place].  ALSO, pt states she is not scheduled for her Bone Density test until the 25th. She states Audelia Acton requested a follow up. She would like to know if that appointment needs to be before or after the bone density. Pt can be reached at [phone].

## 2019-07-20 ENCOUNTER — Other Ambulatory Visit: Payer: Self-pay | Admitting: Medical

## 2019-07-20 MED ORDER — ALPRAZOLAM 1 MG PO TABS: ORAL_TABLET | ORAL | 2 refills | Status: DC

## 2019-07-20 MED ORDER — HYDROCODONE-ACETAMINOPHEN 10-325 MG PO TABS: 1.0000 | ORAL_TABLET | Freq: Every day | ORAL | 0 refills | Status: DC | PRN

## 2019-07-25 ENCOUNTER — Encounter: Payer: Self-pay | Admitting: Medical

## 2019-07-25 ENCOUNTER — Ambulatory Visit: Payer: BC Managed Care – PPO | Admitting: Medical

## 2019-07-25 ENCOUNTER — Other Ambulatory Visit: Payer: Self-pay

## 2019-07-25 VITALS — BP 150/86 | HR 92 | Temp 98.0°F | Ht 63.5 in | Wt 138.8 lb

## 2019-07-25 DIAGNOSIS — R945 Abnormal results of liver function studies: Secondary | ICD-10-CM | POA: Diagnosis not present

## 2019-07-25 DIAGNOSIS — Z7189 Other specified counseling: Secondary | ICD-10-CM | POA: Diagnosis not present

## 2019-07-25 DIAGNOSIS — F419 Anxiety disorder, unspecified: Secondary | ICD-10-CM | POA: Diagnosis not present

## 2019-07-25 DIAGNOSIS — R7989 Other specified abnormal findings of blood chemistry: Secondary | ICD-10-CM

## 2019-07-25 DIAGNOSIS — K589 Irritable bowel syndrome without diarrhea: Secondary | ICD-10-CM

## 2019-07-25 DIAGNOSIS — G8929 Other chronic pain: Secondary | ICD-10-CM

## 2019-07-25 DIAGNOSIS — M545 Low back pain: Secondary | ICD-10-CM | POA: Diagnosis not present

## 2019-07-25 DIAGNOSIS — E2839 Other primary ovarian failure: Secondary | ICD-10-CM

## 2019-07-25 DIAGNOSIS — Z79899 Other long term (current) drug therapy: Secondary | ICD-10-CM

## 2019-07-25 DIAGNOSIS — Z7185 Encounter for immunization safety counseling: Secondary | ICD-10-CM

## 2019-07-25 DIAGNOSIS — G4700 Insomnia, unspecified: Secondary | ICD-10-CM | POA: Diagnosis not present

## 2019-07-25 DIAGNOSIS — Z780 Asymptomatic menopausal state: Secondary | ICD-10-CM

## 2019-07-25 NOTE — Progress Notes (Signed)
Subjective:  Shelia Snyder is a 53 y.o. female who presents for Chief CoClaudette Lawsmplaint  Patient presents with  . Medication Management  . Back Pain    sciatic-had surgery years ago      Here for routine med check.  She has history of chronic back pain.  Has been on chronic medicine for quite a while now, several years.   Has worse back pain of late, keeping her up at night.   Exercise-was doing yoga, but not able to do this now.  She continues on Cymbalta 30 mg twice daily for both anxiety and pain.  She continues on hydrocodone once daily typically at bedtime.  She continues on gabapentin 600 mg 3 times a day.  She does note pains down left leg, at times prickly feeling in left leg.   Recently tried different shoes to help but that didn't help.  Last MRI several years ago.   Prior back surgery 2000 with Dr. Hermelinda MedicusSchwartz, discectomy L4-5.    She continues trazodone 50 mg daily at bedtime to help with sleep.  She continues Xanax as needed.  Chronic diarrhea, IBS-doing fine on Viberzi  [facility] [place].  Was dehydrated, had abdominal pain.   Had labs, CT abdomen.  No other aggravating or relieving factors.    No other c/o.  Past Medical History:  Diagnosis Date  . Anxiety   . Depression   . Diarrhea   . Elevated liver enzymes   . IBS (irritable bowel syndrome) 09/04/2011  . Left leg pain    related to L4-L5 ruptured disc   Current Outpatient Medications on File Prior to Visit  Medication Sig Dispense Refill  . ALPRAZolam (XANAX) 1 MG tablet TAKE 1 TABLET(1 MG) BY MOUTH AT BEDTIME AS NEEDED FOR ANXIETY 30 tablet 2  . chlorhexidine (PERIDEX) 0.12 % solution     . cholecalciferol (VITAMIN D3) 25 MCG (1000 UT) tablet Take 2 tablets (2,000 Units total) by mouth daily. 180 tablet 3  . DULoxetine (CYMBALTA) 30 MG capsule Take 1 capsule (30 mg total) by mouth 2 (two) times daily. 180 capsule 1  . Eluxadoline (VIBERZI) 75 MG TABS Take 1 tablet by mouth 2 (two) times daily.  180 tablet 3  . gabapentin (NEURONTIN) 600 MG tablet Take 1 tablet (600 mg total) by mouth 3 (three) times daily. 270 tablet 3  . HYDROcodone-acetaminophen (NORCO) 10-325 MG tablet Take 1 tablet by mouth daily as needed. 30 tablet 0  . omeprazole (PRILOSEC) 40 MG capsule TAKE 1 CAPSULE BY MOUTH EVERY DAY 90 capsule 3  . ondansetron (ZOFRAN-ODT) 4 MG disintegrating tablet DISSOLVE 1 T PO  Q 6 H PRN FOR NAUSEA    . traZODone (DESYREL) 50 MG tablet Take 1 tablet (50 mg total) by mouth at bedtime. 90 tablet 3   No current facility-administered medications on file prior to visit.    Past Surgical History:  Procedure Laterality Date  . BACK SURGERY  2000   L4-L5 discectomy  . BREAST LUMPECTOMY     benign, right  . CHOLECYSTECTOMY    . COLONOSCOPY  09/05/2011   Procedure: COLONOSCOPY;  Surgeon: Malissa HippoNajeeb U Rehman, MD;  Location: [facility];  Service: Endoscopy;  Laterality: N/A;  . COLONOSCOPY  2016   melanosis coli, Dr. Jones Skenehristopher Benson  . OOPHORECTOMY     right, abnormal bleeding after hysterectomy  . TOTAL ABDOMINAL HYSTERECTOMY     has 1 ovary, hx/o heavy bleeding, s/p ablation  The following portions of the patient's history were reviewed and updated as appropriate: allergies, current medications, past family history, past medical history, past social history, past surgical history and problem list.  ROS Otherwise as in subjective above     Objective: BP (!) 150/86   Pulse 92   Temp 98 F (36.7 C) (Oral)   Ht 5' 3.5" (1.613 m)   Wt 138 lb 12.8 oz (63 kg)   SpO2 96%   BMI 24.20 kg/m    Wt Readings from Last 3 Encounters:  07/25/19 138 lb 12.8 oz (63 kg)  04/27/19 138 lb (62.6 kg)  01/04/19 135 lb 3.2 oz (61.3 kg)   BP Readings from Last 3 Encounters:  07/25/19 (!) 150/86  04/27/19 130/80  01/04/19 110/70   General appearance: alert, no distress, well developed, well nourished abdomen: +bs, soft, non tender, non distended, no masses, no hepatomegaly, no  splenomegaly Back: lumbar surgical scar, nontender to palpation, back flexion to 70 degrees, back extension to 10 degrees, both with pain, no other deformity Pulses: 1+ pedal pulses, normal cap refill Ext: no edema Neuro: unable to left side heel or toe walk, 1+ DTRs on left leg, decrease strength of left leg compared to right, otherwise unremarkable neuro exam Legs nontender, normal ROM, no swelling or deformity   Lumbar xray 06/15/19 IMPRESSION: No fracture. Spondylolisthesis at L5-S1 is felt to be due to underlying spondylosis. There is osteoarthritic change at L4-5 and L5-S1.    Assessment: Encounter Diagnoses  Name Primary?  . Chronic low back pain without sciatica, unspecified back pain laterality Yes  . Anxiety   . Insomnia, unspecified type   . Vaccine counseling   . High risk medication use   . Irritable bowel syndrome, unspecified type   . Post-menopausal   . Estrogen deficiency   . Elevated LFTs      Plan: Chronic back pain-prior surgery, no recent MRI, reviewed L spine xray on file.  Having worse pain despite recent injury, trauma or chagne in activity, and despite current meidcations.   Referral to Dr. Nelva Bush, [facility] for further eval and management.    Anxiety, insomnia-c/t trazodone, xanax daily  Postmenopausal, estrogen deficiency-she has her bone density scheduled but had to be rescheduled recently  Vaccine counseling  Advise yearly flu shot  Advise Shingrix vaccine.  Insurance doesn't cover this.    She declines today, but will consider  Abdominal pain - reviewed recent ED visit notes, CBC and CMET lab. We will request CT abdomen report from [facility].   Pain improved.  C/t viberzi for IBS diarrhea.   Elevated liver tests - on recent labs with hospital ED visit.     Allisson was seen today for medication management and back pain.  Diagnoses and all orders for this visit:  Chronic low back pain without sciatica, unspecified back pain  laterality -     Ambulatory referral to Orthopedic Surgery  Anxiety  Insomnia, unspecified type  Vaccine counseling  High risk medication use  Irritable bowel syndrome, unspecified type  Post-menopausal  Estrogen deficiency  Elevated LFTs -     Hepatic function panel  Other orders -     methocarbamol (ROBAXIN) 500 MG tablet; Take 1 tablet (500 mg total) by mouth 2 (two) times daily as needed for muscle spasms.    Follow up: pending referral

## 2019-07-26 LAB — HEPATIC FUNCTION PANEL
ALT: 24 IU/L (ref 0–32)
AST: 22 IU/L (ref 0–40)
Albumin: 4.8 g/dL (ref 3.8–4.9)
Alkaline Phosphatase: 88 IU/L (ref 39–117)
Bilirubin Total: 0.4 mg/dL (ref 0.0–1.2)
Bilirubin, Direct: 0.12 mg/dL (ref 0.00–0.40)
Total Protein: 6.8 g/dL (ref 6.0–8.5)

## 2019-07-26 MED ORDER — METHOCARBAMOL 500 MG PO TABS: 500.0000 mg | ORAL_TABLET | Freq: Two times a day (BID) | ORAL | 0 refills | Status: DC | PRN

## 2019-08-03 ENCOUNTER — Other Ambulatory Visit: Payer: Self-pay | Admitting: *Deleted

## 2019-08-03 DIAGNOSIS — R6889 Other general symptoms and signs: Secondary | ICD-10-CM | POA: Diagnosis not present

## 2019-08-03 DIAGNOSIS — Z20822 Contact with and (suspected) exposure to covid-19: Secondary | ICD-10-CM

## 2019-08-04 LAB — NOVEL CORONAVIRUS, NAA: SARS-CoV-2, NAA: NOT DETECTED

## 2019-08-06 ENCOUNTER — Other Ambulatory Visit: Payer: Self-pay | Admitting: Medical

## 2019-08-07 NOTE — Telephone Encounter (Signed)
Gabapentin should already have refills.  Please call pharmacy and ask why I am getting request for refill and why they auto populated quantity as #630 tablets???

## 2019-08-08 NOTE — Telephone Encounter (Signed)
Spoke to pharmacy and they stated they are not sure why it populated for 630 tablets nor can they see where it was requested. Also stated patient already picked up her medication on 9/20.

## 2019-08-11 ENCOUNTER — Other Ambulatory Visit: Payer: Self-pay | Admitting: Medical

## 2019-08-11 ENCOUNTER — Ambulatory Visit
Admission: RE | Admit: 2019-08-11 | Discharge: 2019-08-11 | Disposition: A | Discharge status: Home / Self Care | Payer: BC Managed Care – PPO | Source: Ambulatory Visit | Attending: Medical | Admitting: Medical

## 2019-08-11 ENCOUNTER — Other Ambulatory Visit: Payer: Self-pay

## 2019-08-11 DIAGNOSIS — M8588 Other specified disorders of bone density and structure, other site: Secondary | ICD-10-CM | POA: Diagnosis not present

## 2019-08-11 DIAGNOSIS — E2839 Other primary ovarian failure: Secondary | ICD-10-CM

## 2019-08-11 DIAGNOSIS — M810 Age-related osteoporosis without current pathological fracture: Secondary | ICD-10-CM | POA: Diagnosis not present

## 2019-08-11 DIAGNOSIS — Z780 Asymptomatic menopausal state: Secondary | ICD-10-CM | POA: Diagnosis not present

## 2019-08-11 MED ORDER — ALENDRONATE SODIUM 70 MG PO TABS: 70.0000 mg | ORAL_TABLET | ORAL | 11 refills | Status: DC

## 2019-08-12 DIAGNOSIS — M4310 Spondylolisthesis, site unspecified: Secondary | ICD-10-CM | POA: Diagnosis not present

## 2019-08-12 DIAGNOSIS — M545 Low back pain: Secondary | ICD-10-CM | POA: Diagnosis not present

## 2019-08-12 DIAGNOSIS — M961 Postlaminectomy syndrome, not elsewhere classified: Secondary | ICD-10-CM | POA: Diagnosis not present

## 2019-08-12 DIAGNOSIS — M5136 Other intervertebral disc degeneration, lumbar region: Secondary | ICD-10-CM | POA: Diagnosis not present

## 2019-08-17 ENCOUNTER — Other Ambulatory Visit: Payer: Self-pay | Admitting: Medical

## 2019-08-17 ENCOUNTER — Telehealth: Payer: Self-pay | Admitting: Medical

## 2019-08-17 MED ORDER — HYDROCODONE-ACETAMINOPHEN 10-325 MG PO TABS: 1.0000 | ORAL_TABLET | Freq: Every day | ORAL | 0 refills | Status: DC | PRN

## 2019-08-17 NOTE — Telephone Encounter (Signed)
Pt called and is requesting a refill on her [facility] and pt also wants you to know that she is having a MRI tomorrow and to be on the look out for the results DR Rolena Infante ordered it pt wants the RX to go to [facility] [phone] - [place],  - [address] AT [address]

## 2019-08-18 DIAGNOSIS — M545 Low back pain: Secondary | ICD-10-CM | POA: Diagnosis not present

## 2019-08-28 DIAGNOSIS — M5136 Other intervertebral disc degeneration, lumbar region: Secondary | ICD-10-CM | POA: Diagnosis not present

## 2019-09-18 ENCOUNTER — Telehealth: Payer: Self-pay | Admitting: Medical

## 2019-09-18 ENCOUNTER — Other Ambulatory Visit: Payer: Self-pay | Admitting: Medical

## 2019-09-18 MED ORDER — HYDROCODONE-ACETAMINOPHEN 10-325 MG PO TABS: 1.0000 | ORAL_TABLET | Freq: Every day | ORAL | 0 refills | Status: DC | PRN

## 2019-09-18 NOTE — Telephone Encounter (Signed)
Pt called for refills of Hydrocodone. Please send to [facility] in [place]. Pt can be reached at [phone].

## 2019-09-26 ENCOUNTER — Other Ambulatory Visit: Payer: Self-pay

## 2019-09-26 DIAGNOSIS — Z20822 Contact with and (suspected) exposure to covid-19: Secondary | ICD-10-CM

## 2019-09-27 LAB — NOVEL CORONAVIRUS, NAA: SARS-CoV-2, NAA: NOT DETECTED

## 2019-10-04 ENCOUNTER — Other Ambulatory Visit: Payer: Self-pay | Admitting: *Deleted

## 2019-10-05 ENCOUNTER — Telehealth: Payer: Self-pay | Admitting: Medical

## 2019-10-05 ENCOUNTER — Ambulatory Visit: Payer: Self-pay | Admitting: Orthopedic Surgery

## 2019-10-05 NOTE — Telephone Encounter (Signed)
Pt is scheduled for med check on 12/7

## 2019-10-05 NOTE — Telephone Encounter (Signed)
Schedule preop consult/med check for in the next few weeks

## 2019-10-16 ENCOUNTER — Other Ambulatory Visit: Payer: Self-pay | Admitting: Medical

## 2019-10-16 ENCOUNTER — Telehealth: Payer: Self-pay

## 2019-10-16 MED ORDER — HYDROCODONE-ACETAMINOPHEN 10-325 MG PO TABS: 1.0000 | ORAL_TABLET | Freq: Every day | ORAL | 0 refills | Status: DC | PRN

## 2019-10-16 NOTE — Telephone Encounter (Signed)
Pt. Called stating it is time to refill her Hydrocodone last filled 09/18/19 pt. Was last seen 07/25/19 and would like it called into [facility] in [place].

## 2019-10-17 ENCOUNTER — Other Ambulatory Visit: Payer: Self-pay | Admitting: Medical

## 2019-10-21 ENCOUNTER — Other Ambulatory Visit: Payer: Self-pay | Admitting: Medical

## 2019-10-23 ENCOUNTER — Encounter: Payer: Self-pay | Admitting: Medical

## 2019-10-23 ENCOUNTER — Ambulatory Visit: Payer: BC Managed Care – PPO | Admitting: Medical

## 2019-10-23 ENCOUNTER — Other Ambulatory Visit: Payer: Self-pay

## 2019-10-23 VITALS — BP 112/72 | HR 91 | Temp 98.0°F | Ht 64.0 in | Wt 141.4 lb

## 2019-10-23 DIAGNOSIS — Z7189 Other specified counseling: Secondary | ICD-10-CM

## 2019-10-23 DIAGNOSIS — Z8249 Family history of ischemic heart disease and other diseases of the circulatory system: Secondary | ICD-10-CM

## 2019-10-23 DIAGNOSIS — Z7185 Encounter for immunization safety counseling: Secondary | ICD-10-CM

## 2019-10-23 DIAGNOSIS — Z780 Asymptomatic menopausal state: Secondary | ICD-10-CM

## 2019-10-23 DIAGNOSIS — M545 Low back pain, unspecified: Secondary | ICD-10-CM

## 2019-10-23 DIAGNOSIS — K589 Irritable bowel syndrome without diarrhea: Secondary | ICD-10-CM

## 2019-10-23 DIAGNOSIS — Z0000 Encounter for general adult medical examination without abnormal findings: Secondary | ICD-10-CM

## 2019-10-23 DIAGNOSIS — F419 Anxiety disorder, unspecified: Secondary | ICD-10-CM

## 2019-10-23 DIAGNOSIS — Z23 Encounter for immunization: Secondary | ICD-10-CM | POA: Diagnosis not present

## 2019-10-23 DIAGNOSIS — E559 Vitamin D deficiency, unspecified: Secondary | ICD-10-CM | POA: Diagnosis not present

## 2019-10-23 DIAGNOSIS — R9389 Abnormal findings on diagnostic imaging of other specified body structures: Secondary | ICD-10-CM

## 2019-10-23 DIAGNOSIS — Z79899 Other long term (current) drug therapy: Secondary | ICD-10-CM | POA: Diagnosis not present

## 2019-10-23 DIAGNOSIS — E2839 Other primary ovarian failure: Secondary | ICD-10-CM

## 2019-10-23 DIAGNOSIS — Z01818 Encounter for other preprocedural examination: Secondary | ICD-10-CM

## 2019-10-23 DIAGNOSIS — Z90710 Acquired absence of both cervix and uterus: Secondary | ICD-10-CM

## 2019-10-23 DIAGNOSIS — K219 Gastro-esophageal reflux disease without esophagitis: Secondary | ICD-10-CM

## 2019-10-23 DIAGNOSIS — M810 Age-related osteoporosis without current pathological fracture: Secondary | ICD-10-CM

## 2019-10-23 DIAGNOSIS — K529 Noninfective gastroenteritis and colitis, unspecified: Secondary | ICD-10-CM

## 2019-10-23 DIAGNOSIS — G4700 Insomnia, unspecified: Secondary | ICD-10-CM

## 2019-10-23 DIAGNOSIS — G8929 Other chronic pain: Secondary | ICD-10-CM

## 2019-10-23 DIAGNOSIS — K6389 Other specified diseases of intestine: Secondary | ICD-10-CM

## 2019-10-23 MED ORDER — FAMOTIDINE 40 MG PO TABS: 40.0000 mg | ORAL_TABLET | Freq: Every day | ORAL | 2 refills | Status: DC

## 2019-10-23 NOTE — Patient Instructions (Addendum)
Recommendations:  Cancer screening: Check with [facility] in [place] on when you are suppose to repeat colonoscopy?  You had melanosis of colon, but no other abnormalities.    Ask if this is a 5 year or 10 year repeat.  5 years would be up next year 2021.   We will send you home with stool cards to turn in for intermediate colon cancer screen   Breast cancer screening  Remind gynecology that last year's mammogram showed right breast calcifications.  So you may need diagnostic mammogram right, screening left  follow up with gynecology for routine breast/pelvic exam    Vaccines: We updated flu shot today You are up to date on Tetanus booster  Sometimes after February 2021 I would recommend shingles vaccines Shingles vaccine:  I recommend you have a shingles vaccine to help prevent shingles or herpes zoster outbreak.   Please call your insurer to inquire about coverage for the Shingrix vaccine given in 2 doses.   Some insurers cover this vaccine after age 58, some cover this after age 13.  If your insurer covers this, then call to schedule appointment to have this vaccine here.   Your breathing test/pulmonary function test was normal today   We will call with lab results
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
## 2019-10-24 ENCOUNTER — Encounter: Payer: BC Managed Care – PPO | Admitting: Medical

## 2019-10-24 ENCOUNTER — Ambulatory Visit: Payer: BC Managed Care – PPO | Admitting: Vascular Surgery

## 2019-10-24 ENCOUNTER — Other Ambulatory Visit: Payer: Self-pay

## 2019-10-24 ENCOUNTER — Other Ambulatory Visit: Payer: Self-pay | Admitting: Medical

## 2019-10-24 ENCOUNTER — Encounter: Payer: Self-pay | Admitting: Vascular Surgery

## 2019-10-24 VITALS — BP 144/84 | HR 85 | Temp 98.0°F | Resp 20 | Ht 64.0 in | Wt 142.0 lb

## 2019-10-24 DIAGNOSIS — M5137 Other intervertebral disc degeneration, lumbosacral region: Secondary | ICD-10-CM | POA: Diagnosis not present

## 2019-10-24 LAB — CBC WITH DIFFERENTIAL/PLATELET
Basophils Absolute: 0.1 10*3/uL (ref 0.0–0.2)
Basos: 1 %
EOS (ABSOLUTE): 0.3 10*3/uL (ref 0.0–0.4)
Eos: 5 %
Hematocrit: 46.9 % — ABNORMAL HIGH (ref 34.0–46.6)
Hemoglobin: 15.4 g/dL (ref 11.1–15.9)
Immature Grans (Abs): 0 10*3/uL (ref 0.0–0.1)
Immature Granulocytes: 0 %
Lymphocytes Absolute: 1.2 10*3/uL (ref 0.7–3.1)
Lymphs: 25 %
MCH: 29.8 pg (ref 26.6–33.0)
MCHC: 32.8 g/dL (ref 31.5–35.7)
MCV: 91 fL (ref 79–97)
Monocytes Absolute: 0.5 10*3/uL (ref 0.1–0.9)
Monocytes: 11 %
Neutrophils Absolute: 2.8 10*3/uL (ref 1.4–7.0)
Neutrophils: 58 %
Platelets: 294 10*3/uL (ref 150–450)
RBC: 5.16 x10E6/uL (ref 3.77–5.28)
RDW: 13 % (ref 11.7–15.4)
WBC: 4.8 10*3/uL (ref 3.4–10.8)

## 2019-10-24 LAB — VITAMIN D 25 HYDROXY (VIT D DEFICIENCY, FRACTURES): Vit D, 25-Hydroxy: 21.5 ng/mL — ABNORMAL LOW (ref 30.0–100.0)

## 2019-10-24 LAB — BASIC METABOLIC PANEL
BUN/Creatinine Ratio: 13 (ref 9–23)
BUN: 10 mg/dL (ref 6–24)
CO2: 27 mmol/L (ref 20–29)
Calcium: 9.9 mg/dL (ref 8.7–10.2)
Chloride: 101 mmol/L (ref 96–106)
Creatinine, Ser: 0.78 mg/dL (ref 0.57–1.00)
GFR calc Af Amer: 99 mL/min/{1.73_m2} (ref >59)
GFR calc non Af Amer: 86 mL/min/{1.73_m2} (ref >59)
Glucose: 97 mg/dL (ref 65–99)
Potassium: 4.7 mmol/L (ref 3.5–5.2)
Sodium: 139 mmol/L (ref 134–144)

## 2019-10-24 LAB — PROTIME-INR
INR: 1 (ref 0.9–1.2)
Prothrombin Time: 11.1 s (ref 9.1–12.0)

## 2019-10-24 LAB — LIPID PANEL
Chol/HDL Ratio: 2.5 ratio (ref 0.0–4.4)
Cholesterol, Total: 251 mg/dL — ABNORMAL HIGH (ref 100–199)
HDL: 100 mg/dL (ref >39)
LDL Chol Calc (NIH): 141 mg/dL — ABNORMAL HIGH (ref 0–99)
Triglycerides: 60 mg/dL (ref 0–149)
VLDL Cholesterol Cal: 10 mg/dL (ref 5–40)

## 2019-10-24 LAB — APTT: aPTT: 27 s (ref 24–33)

## 2019-10-24 MED ORDER — VITAMIN D (ERGOCALCIFEROL) 1.25 MG (50000 UNIT) PO CAPS: 50000.0000 [IU] | ORAL_CAPSULE | ORAL | 3 refills | Status: DC

## 2019-10-24 NOTE — Progress Notes (Signed)
[facility]  Patient name: Shelia Snyder MRN: [id number] DOB: [birth year] Sex: female  REASON FOR CONSULT: Discuss anterior approach for L5-S1 disc fusion with Dr. Shon Baton  HPI: Shelia Snyder is a 53 y.o. female, who is here for discussion of anterior approach for L5-S1 disc fusion.  She is a very pleasant female with progressively severe back pain.  Her main complaint is with sitting.  Is very difficult for her to drive or ride in a car or sit for prolonged periods of time.  She has severe pain extending down into her legs.  She did have discectomy with some relief approximately 10 years ago in [facility].  She now has had progressive symptoms.  She does have relief with spinal injections for a very brief period of time with an Gurpreet Mikhail recurrence.  She has had prior laparoscopic cholecystectomy and reports a prior vaginal hysterectomy.  Past Medical History:  Diagnosis Date  . Anxiety   . Chronic low back pain   . Degeneration of lumbar intervertebral disc   . Degenerative spondylolisthesis   . Depression   . Diarrhea   . Elevated liver enzymes   . IBS (irritable bowel syndrome) 09/04/2011  . Left leg pain    related to L4-L5 ruptured disc  . Osteoporosis     Family History  Problem Relation Age of Onset  . Heart attack Mother 35       MI  . Aneurysm Mother        abdominal, smoker  . Hypertension Mother   . Heart disease Mother 25  . COPD Father   . Diabetes Maternal Uncle   . Cancer Maternal Grandmother        colon  . Cancer Paternal Grandmother        breast  . Stroke Neg Hx     SOCIAL HISTORY: Social History   Socioeconomic History  . Marital status: Married    Spouse name: Not on file  . Number of children: Not on file  . Years of education: Not on file  . Highest education level: Not on file  Occupational History  . Not on file  Social Needs  . Financial resource strain: Not on file  . Food  insecurity    Worry: Not on file    Inability: Not on file  . Transportation needs    Medical: Not on file    Non-medical: Not on file  Tobacco Use  . Smoking status: Former Smoker    Packs/day: 1.00    Years: 19.00    Pack years: 19.00  . Smokeless tobacco: Never Used  Substance and Sexual Activity  . Alcohol use: No  . Drug use: No  . Sexual activity: Yes    Birth control/protection: None  Lifestyle  . Physical activity    Days per week: Not on file    Minutes per session: Not on file  . Stress: Not on file  Relationships  . Social Musician on phone: Not on file    Gets together: Not on file    Attends religious service: Not on file    Active member of club or organization: Not on file    Attends meetings of clubs or organizations: Not on file    Relationship status: Not on file  . Intimate partner violence    Fear of current or ex partner: Not on file    Emotionally abused: Not on file  Physically abused: Not on file    Forced sexual activity: Not on file  Other Topics Concern  . Not on file  Social History Narrative   Married, husband Shelia Snyder, son Shelia Snyder, works at [employer], exercise with walking.   10/2019    Allergies  Allergen Reactions  . Bee Venom Anaphylaxis  . Sulfa Antibiotics Anaphylaxis  . Latex Rash    Current Outpatient Medications  Medication Sig Dispense Refill  . alendronate (FOSAMAX) 70 MG tablet Take 1 tablet (70 mg total) by mouth every 7 (seven) days. Take with a full glass of water on an empty stomach. 4 tablet 11  . ALPRAZolam (XANAX) 1 MG tablet TAKE 1 TABLET(1 MG) BY MOUTH AT BEDTIME AS NEEDED FOR ANXIETY 30 tablet 0  . chlorhexidine (PERIDEX) 0.12 % solution     . dicyclomine (BENTYL) 10 MG capsule dicyclomine 10 mg capsule    . DULoxetine (CYMBALTA) 30 MG capsule TAKE 1 CAPSULE BY MOUTH TWICE DAILY 180 capsule 1  . Eluxadoline (VIBERZI) 75 MG TABS Take 1 tablet by mouth 2 (two) times daily. 180 tablet 3  . famotidine  (PEPCID) 40 MG tablet Take 1 tablet (40 mg total) by mouth daily. 30 tablet 2  . gabapentin (NEURONTIN) 600 MG tablet Take 1 tablet (600 mg total) by mouth 3 (three) times daily. 270 tablet 3  . HYDROcodone-acetaminophen (NORCO) 10-325 MG tablet Take 1 tablet by mouth daily as needed. 30 tablet 0  . methocarbamol (ROBAXIN) 500 MG tablet Take 1 tablet (500 mg total) by mouth 2 (two) times daily as needed for muscle spasms. 20 tablet 0  . ondansetron (ZOFRAN-ODT) 4 MG disintegrating tablet DISSOLVE 1 T PO  Q 6 H PRN FOR NAUSEA    . traZODone (DESYREL) 50 MG tablet Take 1 tablet (50 mg total) by mouth at bedtime. 90 tablet 3  . Vitamin D, Ergocalciferol, (DRISDOL) 1.25 MG (50000 UT) CAPS capsule Take 1 capsule (50,000 Units total) by mouth every 7 (seven) days. 4.5 capsule 3   No current facility-administered medications for this visit.     REVIEW OF SYSTEMS:  [X]  denotes positive finding, [ ]  denotes negative finding Cardiac  Comments:  Chest pain or chest pressure:    Shortness of breath upon exertion:    Short of breath when lying flat:    Irregular heart rhythm:        Vascular    Pain in calf, thigh, or hip brought on by ambulation:    Pain in feet at night that wakes you up from your sleep:     Blood clot in your veins:    Leg swelling:         Pulmonary    Oxygen at home:    Productive cough:     Wheezing:         Neurologic    Sudden weakness in arms or legs:     Sudden numbness in arms or legs:     Sudden onset of difficulty speaking or slurred speech:    Temporary loss of vision in one eye:     Problems with dizziness:         Gastrointestinal    Blood in stool:     Vomited blood:         Genitourinary    Burning when urinating:     Blood in urine:        Psychiatric    Major depression:         Hematologic  Bleeding problems:    Problems with blood clotting too easily:        Skin    Rashes or ulcers:        Constitutional    Fever or chills:       PHYSICAL EXAM: Vitals:   10/24/19 1519  BP: (!) 144/84  Pulse: 85  Resp: 20  Temp: 98 F (36.7 C)  SpO2: 99%  Weight: 142 lb (64.4 kg)  Height: 5\' 4"  (1.626 m)    GENERAL: The patient is a well-nourished female, in no acute distress. The vital signs are documented above. CARDIOVASCULAR: Carotid arteries without bruits bilaterally.  2+ radial, 2+ femoral, 2+ dorsalis pedis pulses bilaterally PULMONARY: There is good air exchange  ABDOMEN: Soft and non-tender  MUSCULOSKELETAL: There are no major deformities or cyanosis. NEUROLOGIC: No focal weakness or paresthesias are detected. SKIN: There are no ulcers or rashes noted. PSYCHIATRIC: The patient has a normal affect.  DATA:  CT scan from number of years ago in 2014 was reviewed which showed no aortic calcification.  MRI shows degenerative disc disease.  MEDICAL ISSUES: Had a long discussion with the patient regarding my role for anterior exposure.  Explained that Dr. Rolena Infante is determined that surgery is indicated and an anterior approach is appropriate.  Explained the the left lower quadrant transverse incision and mobilization of the rectus muscle.  Discussed retroperitoneal exposure and mobilization of the end intra-abdominal contents.  Also discussed mobilization of the left ureter.  Described mobilization of the arterial venous structures overlying the spine and potential injury of all these.  Particular discussed the potential for venous injury.  She understands and wishes to proceed with surgery as scheduled on 11/08/2019   Rosetta Posner, MD [facility] Tel [phone] Pager [phone]

## 2019-10-30 ENCOUNTER — Ambulatory Visit: Payer: Self-pay | Admitting: Orthopedic Surgery

## 2019-10-30 DIAGNOSIS — M545 Low back pain: Secondary | ICD-10-CM | POA: Diagnosis not present

## 2019-10-30 NOTE — H&P (Signed)
Subjective:   Shelia Snyder is a pleasant 53 year old female with past medical history significant for Osteoporosis (on medications), thyroid problems, anxiety/depression, and previous Lumbar discectomy ho is been having progressive debilitating back pain with intermittent dysesthesias into the lower extremity for the last 6-12 months. She states her quality-of-life is been significantly hindered. Despite injection therapy as well as formal physical therapy and self directed exercises her quality-of-life continues to deteriorate. Therefore, she elected to move forward with surgical intervention.  She is scheduled for ALIF L5-S1 on 11/08/19 [facility] with Dr. Rolena Infante.  Patient Active Problem List   Diagnosis Date Noted  . Osteoporosis 10/23/2019  . Melanosis, colon 10/23/2019  . Preop examination 10/23/2019  . Chronic diarrhea 10/04/2018  . High risk medication use 10/04/2018  . Vitamin D deficiency 10/04/2018  . Family history of heart disease 07/01/2018  . Encounter for health maintenance examination in adult 07/01/2018  . Need for influenza vaccination 07/01/2018  . Post-menopausal 07/01/2018  . Estrogen deficiency 07/01/2018  . S/P hysterectomy 07/01/2018  . Vaccine counseling 04/28/2018  . Decreased pedal pulses 04/28/2018  . Gastroesophageal reflux disease 01/26/2018  . Insomnia 01/26/2018  . Chronic back pain 10/19/2017  . Thrombocytopenia ([place]) 09/07/2011  . IBS (irritable bowel syndrome) 09/04/2011  . Anxiety 09/02/2011  . Elevated LFTs 09/01/2011   Past Medical History:  Diagnosis Date  . Anxiety   . Chronic low back pain   . Degeneration of lumbar intervertebral disc   . Degenerative spondylolisthesis   . Depression   . Diarrhea   . Elevated liver enzymes   . IBS (irritable bowel syndrome) 09/04/2011  . Left leg pain    related to L4-L5 ruptured disc  . Osteoporosis     Past Surgical History:  Procedure Laterality Date  . BACK SURGERY  2000   L4-L5  discectomy  . BREAST LUMPECTOMY     benign, right  . CHOLECYSTECTOMY    . COLONOSCOPY  09/05/2011   Procedure: COLONOSCOPY;  Surgeon: Rogene Houston, MD;  Location: [facility];  Service: Endoscopy;  Laterality: N/A;  . COLONOSCOPY  2016   melanosis coli, Dr. Doristine Mango  . OOPHORECTOMY     right, abnormal bleeding after hysterectomy  . TOTAL ABDOMINAL HYSTERECTOMY     has 1 ovary, hx/o heavy bleeding, s/p ablation    Current Outpatient Medications  Medication Sig Dispense Refill Last Dose  . alendronate (FOSAMAX) 70 MG tablet Take 1 tablet (70 mg total) by mouth every 7 (seven) days. Take with a full glass of water on an empty stomach. 4 tablet 11   . ALPRAZolam (XANAX) 1 MG tablet TAKE 1 TABLET(1 MG) BY MOUTH AT BEDTIME AS NEEDED FOR ANXIETY 30 tablet 0   . chlorhexidine (PERIDEX) 0.12 % solution      . dicyclomine (BENTYL) 10 MG capsule dicyclomine 10 mg capsule     . DULoxetine (CYMBALTA) 30 MG capsule TAKE 1 CAPSULE BY MOUTH TWICE DAILY 180 capsule 1   . Eluxadoline (VIBERZI) 75 MG TABS Take 1 tablet by mouth 2 (two) times daily. 180 tablet 3   . famotidine (PEPCID) 40 MG tablet Take 1 tablet (40 mg total) by mouth daily. 30 tablet 2   . gabapentin (NEURONTIN) 600 MG tablet Take 1 tablet (600 mg total) by mouth 3 (three) times daily. 270 tablet 3   . HYDROcodone-acetaminophen (NORCO) 10-325 MG tablet Take 1 tablet by mouth daily as needed. 30 tablet 0   . methocarbamol (ROBAXIN) 500 MG tablet Take  1 tablet (500 mg total) by mouth 2 (two) times daily as needed for muscle spasms. 20 tablet 0   . ondansetron (ZOFRAN-ODT) 4 MG disintegrating tablet DISSOLVE 1 T PO  Q 6 H PRN FOR NAUSEA     . traZODone (DESYREL) 50 MG tablet Take 1 tablet (50 mg total) by mouth at bedtime. 90 tablet 3   . Vitamin D, Ergocalciferol, (DRISDOL) 1.25 MG (50000 UT) CAPS capsule Take 1 capsule (50,000 Units total) by mouth every 7 (seven) days. 4.5 capsule 3    No current facility-administered  medications for this visit.   Allergies  Allergen Reactions  . Bee Venom Anaphylaxis  . Sulfa Antibiotics Anaphylaxis  . Latex Rash    Social History   Tobacco Use  . Smoking status: Former Smoker    Packs/day: 1.00    Years: 19.00    Pack years: 19.00  . Smokeless tobacco: Never Used  Substance Use Topics  . Alcohol use: No    Family History  Problem Relation Age of Onset  . Heart attack Mother [id number]       MI  . Aneurysm Mother        abdominal, smoker  . Hypertension Mother   . Heart disease Mother [id number]  . COPD Father   . Diabetes Maternal Uncle   . Cancer Maternal Grandmother        colon  . Cancer Paternal Grandmother        breast  . Stroke Neg Hx     Review of Systems As stated in HPI  Objective:   Vitals: Ht: 5 ft 4 in 10/30/2019 10:03 am BP: 128/88 sitting R arm 10/30/2019 10:06 am Pulse: 107 bpm 10/30/2019 10:06 am  Clinical exam:Shelia Snyder is a pleasant individual, who appears younger than their stated age. She Is alert and orientated 3.   Heart: Regular rate and rhythm, no rubs, murmurs, gallops. No chest pain  Lungs: Clear auscultation bilaterally.  No shortness of breath  Abdomen: Bowel sounds 4, soft and non-tender, negative loss of bowel and bladder control, no rebound tenderness.   Negative: skin lesions abrasions contusions  Peripheral pulses: 2+ dorsalis pedis/posterior tibialis pulses. Compartment soft and nontender.  Gait pattern: Normal  Assistive devices: None  Neuro: Positive numbness and dysesthesias in left lower extremity, positive straight leg raise test with reproduction of pain down to about the level of the knee. 5/5 motor strength bilaterally in the lower extremity. Negative Babinski test, 1+ deep tendon reflexes at the knee and Achilles.  Musculoskeletal: Moderate low back pain with palpation and range of motion. Positive left SI joint pain with reproduction with Pearlean BrownieFaber test. No significant hip, knee, ankle pain with isolated joint  range of motion  X-rays of the lumbar spine dated 06/15/19 demonstrate slight spondylolisthesis at L5-S1 with Dem degenerative disc disease L5-S1. Mild arthritic changes at L4-5.  Lumbar MRI dated 08/18/19: No significant pathology seen L1 to through L5. There is some mild facet arthropathy at L4-5. L5-S1: Left laminotomy with prior left discectomy. There is degenerative disc disease with endplate osteophytes. Moderate facet arthropathy. Minimal canal stenosis and moderate by foraminal stenosis.  Assessment:   Shelia Snyder is a very pleasant 53 year old woman who is been having progressive debilitating back pain with intermittent dysesthesias into the lower extremity for the last 6-12 months. She states her quality-of-life is been significantly hindered. Despite injection therapy as well as formal physical therapy and self directed exercises her quality-of-life continues to deteriorate. Clinical exam is consistent with significant  back pain with intermittent radiculopathy. Imaging studies confirm previous L5-S1 discectomy with no recurrent disc herniation but there is advanced degenerative disc disease with Mobix endplate changes. X-rays clearly show collapse of greater than 50% of the L5-S1 disc space. MRI of the lumbar spine demonstrates the previous L5-S1 discectomy with no recurrent disc herniation. No impingement on the left S1 nerve root. There is moderate facet arthropathy resulting in moderate by foraminal stenosis and degenerative disc disease.  Plan:   L5-S1 anterior lumbar interbody fusion   I went over the surgical procedure in great detail along with the risks and benefits of surgery. I have given her's a pamphlet so that she will be able to discuss things with her husband. Risks and benefits of surgery were discussed with the patient. These include: Infection, bleeding, death, stroke, paralysis, ongoing or worse pain, need for additional surgery, nonunion, leak of spinal fluid, adjacent segment  degeneration requiring additional fusion surgery, need for posterior decompression and/or fusion. Bleeding from major vessels, and blood clots (deep venous thrombosis)requiring additional treatment, loss in bowel and bladder control, injury to bladder, ureters, and other major abdominal organs.  We have also discussed the goals of surgery to include: Goals of surgery: Reduction in pain, and improvement in quality of life.  We have also discussed the post-operative recovery period to include: bathing/showering restrictions,  wound healing, activity (and driving) restrictions, medications/pain mangement.  We have also discussed post-operative redflags to include: signs and symptoms of postoperative infection, DVT/PE.  We have obtained preoperative clearance from the patient's primary care provider. Patient has been seen and evaluated by vascular surgery.  I have reviewed the patient's medication list with her.  She is not on any blood thinners or aspirin.  She does take ibuprofen as needed for pain.  I have advised her to hold any anti-inflammatory medications for 1 week prior to surgery as well as 2 weeks postoperatively.  She did express any of this.  She is on vitamin D as well as a daily vitamin which I have advised her to hold leading up to surgery in 2 weeks following surgery as well.  Patient has appointment with physical therapy today to be fitted for her LSO brace.  Both patient and her husband were in attendance at today's visit, all questions were invited and answered.  Plan is to move forward with surgical prevention as scheduled pending preoperative testing at [facility].  Follow-up: 2 weeks postoperatively

## 2019-11-03 NOTE — Progress Notes (Signed)
[facility] [phone] - [place], [place] - [address] AT [facility] [address] [place] [address] [place] [address] Phone: [phone] Fax: [phone]  [id number] - [place], [place] - [address] [facility] [address] [facility] [address] [place] [address] Phone: [phone] Fax: [phone]      Your procedure is scheduled on .  Report to [facility] Main Entrance "A" at 5:30 A.M., and check in at the Admitting office.  Call this number if you have problems the morning of surgery:  [phone]  Call [phone] if you have any questions prior to your surgery date Monday-Friday 8am-4pm    Remember:  Do not eat or drink after midnight the night before your surgery   Take these medicines the morning of surgery with A SIP OF WATER: dicyclomine (BENTYL) DULoxetine (CYMBALTA) Eluxadoline (VIBERZI) famotidine (PEPCID) gabapentin (NEURONTIN) HYDROcodone-acetaminophen (NORCO) - as needed methocarbamol (ROBAXIN) - as needed  As of today, STOP taking any Aspirin (unless otherwise instructed by your surgeon), Aleve, Naproxen, Ibuprofen, Motrin, Advil, Goody's, BC's, all herbal medications, fish oil, and all vitamins.    The Morning of Surgery  Do not wear jewelry, make-up or nail polish.  Do not wear lotions, powders, or perfumes or deodorant  Do not shave 48 hours prior to surgery.  Men may shave face and neck.  Do not bring valuables to the hospital.  [facility] is not responsible for any belongings or valuables.  If you are a smoker, DO NOT Smoke 24 hours prior to surgery  If you wear a CPAP at night please bring your mask, tubing, and machine the morning of surgery   Remember that you must have someone to transport you home after your surgery, and remain with you for 24 hours if you are discharged the same day.   Please bring cases for contacts, glasses, hearing aids, dentures or bridgework because it cannot be worn into  surgery.    Leave your suitcase in the car.  After surgery it may be brought to your room.  For patients admitted to the hospital, discharge time will be determined by your treatment team.  Patients discharged the day of surgery will not be allowed to drive home.    Special instructions:   [place]- Preparing For Surgery  Before surgery, you can play an important role. Because skin is not sterile, your skin needs to be as free of germs as possible. You can reduce the number of germs on your skin by washing with CHG (chlorahexidine gluconate) Soap before surgery.  CHG is an antiseptic cleaner which kills germs and bonds with the skin to continue killing germs even after washing.    Oral Hygiene is also important to reduce your risk of infection.  Remember - BRUSH YOUR TEETH THE MORNING OF SURGERY WITH YOUR REGULAR TOOTHPASTE  Please do not use if you have an allergy to CHG or antibacterial soaps. If your skin becomes reddened/irritated stop using the CHG.  Do not shave (including legs and underarms) for at least 48 hours prior to first CHG shower. It is OK to shave your face.  Please follow these instructions carefully.   1. Shower the NIGHT BEFORE SURGERY and the MORNING OF SURGERY with CHG Soap.   2. If you chose to wash your hair, wash your hair first as usual with your normal shampoo.  3. After you shampoo, rinse your hair and  body thoroughly to remove the shampoo.  4. Use CHG as you would any other liquid soap. You can apply CHG directly to the skin and wash gently with a scrungie or a clean washcloth.   5. Apply the CHG Soap to your body ONLY FROM THE NECK DOWN.  Do not use on open wounds or open sores. Avoid contact with your eyes, ears, mouth and genitals (private parts). Wash Face and genitals (private parts)  with your normal soap.   6. Wash thoroughly, paying special attention to the area where your surgery will be performed.  7. Thoroughly rinse your body with warm  water from the neck down.  8. DO NOT shower/wash with your normal soap after using and rinsing off the CHG Soap.  9. Pat yourself dry with a CLEAN TOWEL.  10. Wear CLEAN PAJAMAS to bed the night before surgery, wear comfortable clothes the morning of surgery  11. Place CLEAN SHEETS on your bed the night of your first shower and DO NOT SLEEP WITH PETS.    Day of Surgery:  Please shower the morning of surgery with the CHG soap Do not apply any deodorants/lotions. Please wear clean clothes to the hospital/surgery center.   Remember to brush your teeth WITH YOUR REGULAR TOOTHPASTE.   Please read over the following fact sheets that you were given.

## 2019-11-06 ENCOUNTER — Encounter (HOSPITAL_COMMUNITY): Payer: Self-pay

## 2019-11-06 ENCOUNTER — Other Ambulatory Visit (HOSPITAL_COMMUNITY)
Admission: RE | Admit: 2019-11-06 | Discharge: 2019-11-06 | Disposition: A | Discharge status: Home / Self Care | Payer: BC Managed Care – PPO | Source: Ambulatory Visit | Attending: Orthopedic Surgery | Admitting: Orthopedic Surgery

## 2019-11-06 ENCOUNTER — Encounter (HOSPITAL_COMMUNITY)
Admission: RE | Admit: 2019-11-06 | Discharge: 2019-11-06 | Disposition: A | Discharge status: Home / Self Care | Payer: BC Managed Care – PPO | Source: Ambulatory Visit | Attending: Orthopedic Surgery | Admitting: Orthopedic Surgery

## 2019-11-06 ENCOUNTER — Ambulatory Visit (HOSPITAL_COMMUNITY)
Admission: RE | Admit: 2019-11-06 | Discharge: 2019-11-06 | Disposition: A | Discharge status: Home / Self Care | Payer: BC Managed Care – PPO | Source: Ambulatory Visit | Attending: Orthopedic Surgery | Admitting: Orthopedic Surgery

## 2019-11-06 ENCOUNTER — Other Ambulatory Visit: Payer: Self-pay

## 2019-11-06 DIAGNOSIS — Z01818 Encounter for other preprocedural examination: Secondary | ICD-10-CM | POA: Insufficient documentation

## 2019-11-06 DIAGNOSIS — M5137 Other intervertebral disc degeneration, lumbosacral region: Secondary | ICD-10-CM | POA: Insufficient documentation

## 2019-11-06 DIAGNOSIS — M47814 Spondylosis without myelopathy or radiculopathy, thoracic region: Secondary | ICD-10-CM | POA: Diagnosis not present

## 2019-11-06 DIAGNOSIS — M961 Postlaminectomy syndrome, not elsewhere classified: Secondary | ICD-10-CM | POA: Insufficient documentation

## 2019-11-06 DIAGNOSIS — Z20828 Contact with and (suspected) exposure to other viral communicable diseases: Secondary | ICD-10-CM | POA: Diagnosis not present

## 2019-11-06 LAB — BASIC METABOLIC PANEL
Anion gap: 8 (ref 5–15)
BUN: 9 mg/dL (ref 6–20)
CO2: 27 mmol/L (ref 22–32)
Calcium: 9.6 mg/dL (ref 8.9–10.3)
Chloride: 103 mmol/L (ref 98–111)
Creatinine, Ser: 0.82 mg/dL (ref 0.44–1.00)
GFR calc Af Amer: >60 mL/min (ref >60)
GFR calc non Af Amer: >60 mL/min (ref >60)
Glucose, Bld: 95 mg/dL (ref 70–99)
Potassium: 3.8 mmol/L (ref 3.5–5.1)
Sodium: 138 mmol/L (ref 135–145)

## 2019-11-06 LAB — CBC
HCT: 46.5 % — ABNORMAL HIGH (ref 36.0–46.0)
Hemoglobin: 14.7 g/dL (ref 12.0–15.0)
MCH: 30.1 pg (ref 26.0–34.0)
MCHC: 31.6 g/dL (ref 30.0–36.0)
MCV: 95.1 fL (ref 80.0–100.0)
Platelets: 281 10*3/uL (ref 150–400)
RBC: 4.89 MIL/uL (ref 3.87–5.11)
RDW: 13 % (ref 11.5–15.5)
WBC: 6 10*3/uL (ref 4.0–10.5)
nRBC: 0 % (ref 0.0–0.2)

## 2019-11-06 LAB — TYPE AND SCREEN
ABO/RH(D): A POS
Antibody Screen: NEGATIVE

## 2019-11-06 LAB — URINALYSIS, COMPLETE (UACMP) WITH MICROSCOPIC
Bacteria, UA: NONE SEEN
Bilirubin Urine: NEGATIVE
Glucose, UA: NEGATIVE mg/dL
Hgb urine dipstick: NEGATIVE
Ketones, ur: NEGATIVE mg/dL
Nitrite: NEGATIVE
Protein, ur: NEGATIVE mg/dL
Specific Gravity, Urine: 1.005 (ref 1.005–1.030)
pH: 6 (ref 5.0–8.0)

## 2019-11-06 LAB — SURGICAL PCR SCREEN
MRSA, PCR: NEGATIVE
Staphylococcus aureus: NEGATIVE

## 2019-11-06 LAB — SARS CORONAVIRUS 2 (TAT 6-24 HRS): SARS Coronavirus 2: NEGATIVE

## 2019-11-06 LAB — ABO/RH: ABO/RH(D): A POS

## 2019-11-06 LAB — PROTIME-INR
INR: 0.9 (ref 0.8–1.2)
Prothrombin Time: 12.3 seconds (ref 11.4–15.2)

## 2019-11-06 LAB — APTT: aPTT: 25 seconds (ref 24–36)

## 2019-11-06 NOTE — Progress Notes (Addendum)
PCP - Chana Bode, PA-C Cardiologist - pt denies  Chest x-ray - 11/06/2019 at PAT appt- per MD order EKG - denies past year  PPM/ICD- n/a DEVICE- REP Notified-  Stress Test - pt denies ECHO - 2012 in EPIC  Cardiac Cath - pt denies  Sleep Study - pt denies CPAP - n/a  Fasting Blood Sugar - n/a Checks Blood Sugar _____ times a day- n/a  Blood Thinner Instructions: n/a Aspirin Instructions: n/a  ERAS Protocol- n/a Ensure pre-surgery drink or water given- n/a  COVID testing- 11/06/2019  Anesthesia review: Yes-per Dr. Valla Leaver order  Patient denies shortness of breath, fever, cough and chest pain at PAT appointment  Patient verbalized understanding of instructions that were given to them at the PAT appointment. Patient was also instructed that they will need to review over the PAT instructions again at home before surgery.

## 2019-11-07 NOTE — Progress Notes (Signed)
Anesthesia Chart Review:  Remote hx of elevated LFTs due to gallstones. Review of labs shows LFTs have been WNL for the past 2 years.  Seen and cleared by PCP Chana Bode, PA-C on 10/23/19. Spirometry was ordered for preop eval due to hx of smoking and was normal.   Preop labs reviewed, WNL  Spirometry 10/23/19: FVC 102% pred FEV1 100% pred FEV1/FVC 97% pred  TTE 09/02/11: - Left ventricle: The cavity size was normal. Wall thickness  was normal. Systolic function was normal. The estimated  ejection fraction was in the range of 55% to 60%. Wall  motion was normal; there were no regional wall motion  abnormalities. The study is not technically sufficient to  allow evaluation of LV diastolic function.  - Mitral valve: Trivial regurgitation.  - Atrial septum: The septum was thickened. No definite  defect or patent foramen ovale was identified.  - Tricuspid valve: Trivial regurgitation.  - Pericardium, extracardiac: There was no pericardial  effusion.     Wynonia Musty [facility]/Anesthesiology Phone [phone] 11/07/2019 9:12 AM

## 2019-11-07 NOTE — Anesthesia Preprocedure Evaluation (Addendum)
Anesthesia Evaluation  Patient identified by MRN, date of birth, ID band Patient awake    Reviewed: Allergy & Precautions, NPO status , Patient's Chart, lab work & pertinent test results  Airway Mallampati: II  TM Distance: >3 FB Neck ROM: Full    Dental  (+) Dental Advisory Given   Pulmonary former smoker,    breath sounds clear to auscultation       Cardiovascular negative cardio ROS   Rhythm:Regular Rate:Normal     Neuro/Psych negative neurological ROS     GI/Hepatic Neg liver ROS, GERD  ,  Endo/Other  negative endocrine ROS  Renal/GU negative Renal ROS     Musculoskeletal  (+) Arthritis ,   Abdominal   Peds  Hematology negative hematology ROS (+)   Anesthesia Other Findings   Reproductive/Obstetrics                            Lab Results  Component Value Date   WBC 6.0 11/06/2019   HGB 14.7 11/06/2019   HCT 46.5 (H) 11/06/2019   MCV 95.1 11/06/2019   PLT 281 11/06/2019   Lab Results  Component Value Date   CREATININE 0.82 11/06/2019   BUN 9 11/06/2019   NA 138 11/06/2019   K 3.8 11/06/2019   CL 103 11/06/2019   CO2 27 11/06/2019    Anesthesia Physical Anesthesia Plan  ASA: II  Anesthesia Plan: General   Post-op Pain Management:  Regional for Post-op pain   Induction: Intravenous  PONV Risk Score and Plan: 3 and Dexamethasone, Ondansetron and Treatment may vary due to age or medical condition  Airway Management Planned: Oral ETT  Additional Equipment: Arterial line  Intra-op Plan:   Post-operative Plan: Extubation in OR  Informed Consent: I have reviewed the patients History and Physical, chart, labs and discussed the procedure including the risks, benefits and alternatives for the proposed anesthesia with the patient or authorized representative who has indicated his/her understanding and acceptance.     Dental advisory given  Plan Discussed with:  CRNA  Anesthesia Plan Comments: (Remote hx of elevated LFTs due to gallstones. Review of labs shows LFTs have been WNL for the past 2 years.  Seen and cleared by PCP Chana Bode, PA-C on 10/23/19. Spirometry was ordered for preop eval due to hx of smoking and was normal.   Preop labs reviewed, WNL  Spirometry 10/23/19: FVC 102% pred FEV1 100% pred FEV1/FVC 97% pred  TTE 09/02/11: - Left ventricle: The cavity size was normal. Wall thickness  was normal. Systolic function was normal. The estimated  ejection fraction was in the range of 55% to 60%. Wall  motion was normal; there were no regional wall motion  abnormalities. The study is not technically sufficient to  allow evaluation of LV diastolic function.  - Mitral valve: Trivial regurgitation.  - Atrial septum: The septum was thickened. No definite  defect or patent foramen ovale was identified.  - Tricuspid valve: Trivial regurgitation.  - Pericardium, extracardiac: There was no pericardial  effusion.  )      Anesthesia Quick Evaluation

## 2019-11-08 ENCOUNTER — Inpatient Hospital Stay (HOSPITAL_COMMUNITY)
Admission: RE | Admit: 2019-11-08 | Discharge: 2019-11-09 | DRG: 460 | Disposition: A | Discharge status: Home / Self Care | Payer: BC Managed Care – PPO | Attending: Orthopedic Surgery | Admitting: Orthopedic Surgery

## 2019-11-08 ENCOUNTER — Inpatient Hospital Stay (HOSPITAL_COMMUNITY): Payer: BC Managed Care – PPO

## 2019-11-08 ENCOUNTER — Inpatient Hospital Stay (HOSPITAL_COMMUNITY): Payer: BC Managed Care – PPO | Admitting: Anesthesiology

## 2019-11-08 ENCOUNTER — Inpatient Hospital Stay (HOSPITAL_COMMUNITY): Payer: BC Managed Care – PPO | Admitting: Physician Assistant

## 2019-11-08 ENCOUNTER — Encounter (HOSPITAL_COMMUNITY): Payer: Self-pay | Admitting: Orthopedic Surgery

## 2019-11-08 ENCOUNTER — Other Ambulatory Visit: Payer: Self-pay

## 2019-11-08 ENCOUNTER — Encounter (HOSPITAL_COMMUNITY)
Admission: RE | Disposition: A | Discharge status: Home / Self Care | Payer: Self-pay | Source: Home / Self Care | Attending: Orthopedic Surgery

## 2019-11-08 ENCOUNTER — Observation Stay (HOSPITAL_BASED_OUTPATIENT_CLINIC_OR_DEPARTMENT_OTHER): Payer: BC Managed Care – PPO

## 2019-11-08 DIAGNOSIS — Z91030 Bee allergy status: Secondary | ICD-10-CM

## 2019-11-08 DIAGNOSIS — M5137 Other intervertebral disc degeneration, lumbosacral region: Secondary | ICD-10-CM | POA: Diagnosis not present

## 2019-11-08 DIAGNOSIS — Z833 Family history of diabetes mellitus: Secondary | ICD-10-CM | POA: Diagnosis not present

## 2019-11-08 DIAGNOSIS — G8929 Other chronic pain: Secondary | ICD-10-CM | POA: Diagnosis present

## 2019-11-08 DIAGNOSIS — M810 Age-related osteoporosis without current pathological fracture: Secondary | ICD-10-CM | POA: Diagnosis not present

## 2019-11-08 DIAGNOSIS — M4326 Fusion of spine, lumbar region: Secondary | ICD-10-CM | POA: Diagnosis not present

## 2019-11-08 DIAGNOSIS — Z91040 Latex allergy status: Secondary | ICD-10-CM | POA: Diagnosis not present

## 2019-11-08 DIAGNOSIS — Z7983 Long term (current) use of bisphosphonates: Secondary | ICD-10-CM | POA: Diagnosis not present

## 2019-11-08 DIAGNOSIS — M5416 Radiculopathy, lumbar region: Secondary | ICD-10-CM | POA: Diagnosis not present

## 2019-11-08 DIAGNOSIS — Z882 Allergy status to sulfonamides status: Secondary | ICD-10-CM | POA: Diagnosis not present

## 2019-11-08 DIAGNOSIS — M4807 Spinal stenosis, lumbosacral region: Secondary | ICD-10-CM | POA: Diagnosis present

## 2019-11-08 DIAGNOSIS — G8918 Other acute postprocedural pain: Secondary | ICD-10-CM | POA: Diagnosis not present

## 2019-11-08 DIAGNOSIS — M4317 Spondylolisthesis, lumbosacral region: Secondary | ICD-10-CM | POA: Diagnosis present

## 2019-11-08 DIAGNOSIS — M4727 Other spondylosis with radiculopathy, lumbosacral region: Secondary | ICD-10-CM | POA: Diagnosis not present

## 2019-11-08 DIAGNOSIS — Z87891 Personal history of nicotine dependence: Secondary | ICD-10-CM

## 2019-11-08 DIAGNOSIS — Z800 Family history of malignant neoplasm of digestive organs: Secondary | ICD-10-CM | POA: Diagnosis not present

## 2019-11-08 DIAGNOSIS — M1990 Unspecified osteoarthritis, unspecified site: Secondary | ICD-10-CM | POA: Diagnosis present

## 2019-11-08 DIAGNOSIS — Z79891 Long term (current) use of opiate analgesic: Secondary | ICD-10-CM

## 2019-11-08 DIAGNOSIS — M2578 Osteophyte, vertebrae: Secondary | ICD-10-CM | POA: Diagnosis present

## 2019-11-08 DIAGNOSIS — Z90721 Acquired absence of ovaries, unilateral: Secondary | ICD-10-CM

## 2019-11-08 DIAGNOSIS — Z9049 Acquired absence of other specified parts of digestive tract: Secondary | ICD-10-CM

## 2019-11-08 DIAGNOSIS — M5117 Intervertebral disc disorders with radiculopathy, lumbosacral region: Secondary | ICD-10-CM | POA: Diagnosis not present

## 2019-11-08 DIAGNOSIS — Z90710 Acquired absence of both cervix and uterus: Secondary | ICD-10-CM

## 2019-11-08 DIAGNOSIS — Z9889 Other specified postprocedural states: Secondary | ICD-10-CM | POA: Diagnosis not present

## 2019-11-08 DIAGNOSIS — G4700 Insomnia, unspecified: Secondary | ICD-10-CM | POA: Diagnosis not present

## 2019-11-08 DIAGNOSIS — Y838 Other surgical procedures as the cause of abnormal reaction of the patient, or of later complication, without mention of misadventure at the time of the procedure: Secondary | ICD-10-CM | POA: Diagnosis present

## 2019-11-08 DIAGNOSIS — Z823 Family history of stroke: Secondary | ICD-10-CM

## 2019-11-08 DIAGNOSIS — M961 Postlaminectomy syndrome, not elsewhere classified: Secondary | ICD-10-CM | POA: Diagnosis not present

## 2019-11-08 DIAGNOSIS — Z8249 Family history of ischemic heart disease and other diseases of the circulatory system: Secondary | ICD-10-CM | POA: Diagnosis not present

## 2019-11-08 DIAGNOSIS — Z79899 Other long term (current) drug therapy: Secondary | ICD-10-CM

## 2019-11-08 DIAGNOSIS — Z981 Arthrodesis status: Secondary | ICD-10-CM

## 2019-11-08 DIAGNOSIS — Z419 Encounter for procedure for purposes other than remedying health state, unspecified: Secondary | ICD-10-CM

## 2019-11-08 DIAGNOSIS — M5116 Intervertebral disc disorders with radiculopathy, lumbar region: Secondary | ICD-10-CM | POA: Diagnosis present

## 2019-11-08 HISTORY — PX: ABDOMINAL EXPOSURE: SHX5708

## 2019-11-08 HISTORY — PX: ANTERIOR LUMBAR FUSION: SHX1170

## 2019-11-08 LAB — CREATININE, SERUM
Creatinine, Ser: 0.7 mg/dL (ref 0.44–1.00)
GFR calc Af Amer: >60 mL/min (ref >60)
GFR calc non Af Amer: >60 mL/min (ref >60)

## 2019-11-08 LAB — CBC
HCT: 40.8 % (ref 36.0–46.0)
Hemoglobin: 13.4 g/dL (ref 12.0–15.0)
MCH: 30.5 pg (ref 26.0–34.0)
MCHC: 32.8 g/dL (ref 30.0–36.0)
MCV: 92.9 fL (ref 80.0–100.0)
Platelets: 208 10*3/uL (ref 150–400)
RBC: 4.39 MIL/uL (ref 3.87–5.11)
RDW: 12.9 % (ref 11.5–15.5)
WBC: 10.6 10*3/uL — ABNORMAL HIGH (ref 4.0–10.5)
nRBC: 0 % (ref 0.0–0.2)

## 2019-11-08 SURGERY — ANTERIOR LUMBAR FUSION 1 LEVEL
Anesthesia: General

## 2019-11-08 MED ORDER — ENOXAPARIN SODIUM 40 MG/0.4ML ~~LOC~~ SOLN
40.0000 mg | SUBCUTANEOUS | Status: DC
  Administered 2019-11-09: 40 mg via SUBCUTANEOUS
  Filled 2019-11-08: qty 0.4

## 2019-11-08 MED ORDER — ACETAMINOPHEN 325 MG PO TABS
650.0000 mg | ORAL_TABLET | ORAL | Status: DC | PRN
  Administered 2019-11-08 – 2019-11-09 (×2): 650 mg via ORAL
  Filled 2019-11-08 (×2): qty 2

## 2019-11-08 MED ORDER — OXYCODONE HCL 5 MG PO TABS
10.0000 mg | ORAL_TABLET | ORAL | Status: DC | PRN
  Administered 2019-11-08 – 2019-11-09 (×8): 10 mg via ORAL
  Filled 2019-11-08 (×7): qty 2

## 2019-11-08 MED ORDER — PROPOFOL 10 MG/ML IV BOLUS
INTRAVENOUS | Status: DC | PRN
  Administered 2019-11-08: 100 mg via INTRAVENOUS

## 2019-11-08 MED ORDER — OXYCODONE-ACETAMINOPHEN 10-325 MG PO TABS: 1.0000 | ORAL_TABLET | Freq: Four times a day (QID) | ORAL | 0 refills | Status: AC | PRN

## 2019-11-08 MED ORDER — FENTANYL CITRATE (PF) 250 MCG/5ML IJ SOLN
INTRAMUSCULAR | Status: AC
  Filled 2019-11-08: qty 5

## 2019-11-08 MED ORDER — OXYCODONE HCL 5 MG PO TABS: 5.0000 mg | ORAL_TABLET | ORAL | Status: DC | PRN

## 2019-11-08 MED ORDER — ACETAMINOPHEN 650 MG RE SUPP: 650.0000 mg | RECTAL | Status: DC | PRN

## 2019-11-08 MED ORDER — ONDANSETRON HCL 4 MG/2ML IJ SOLN
INTRAMUSCULAR | Status: DC | PRN
  Administered 2019-11-08: 4 mg via INTRAVENOUS

## 2019-11-08 MED ORDER — PHENYLEPHRINE HCL-NACL 10-0.9 MG/250ML-% IV SOLN
INTRAVENOUS | Status: DC | PRN
  Administered 2019-11-08: 25 ug/min via INTRAVENOUS

## 2019-11-08 MED ORDER — MIDAZOLAM HCL 5 MG/5ML IJ SOLN
INTRAMUSCULAR | Status: DC | PRN
  Administered 2019-11-08: 2 mg via INTRAVENOUS

## 2019-11-08 MED ORDER — CEFAZOLIN SODIUM-DEXTROSE 2-4 GM/100ML-% IV SOLN
2.0000 g | INTRAVENOUS | Status: AC
  Administered 2019-11-08: 2 g via INTRAVENOUS
  Filled 2019-11-08: qty 100

## 2019-11-08 MED ORDER — MIDAZOLAM HCL 2 MG/2ML IJ SOLN
INTRAMUSCULAR | Status: AC
  Filled 2019-11-08: qty 2

## 2019-11-08 MED ORDER — OXYCODONE HCL 5 MG PO TABS
ORAL_TABLET | ORAL | Status: AC
  Filled 2019-11-08: qty 2

## 2019-11-08 MED ORDER — ACETAMINOPHEN 10 MG/ML IV SOLN
INTRAVENOUS | Status: DC | PRN
  Administered 2019-11-08: 1000 mg via INTRAVENOUS

## 2019-11-08 MED ORDER — MORPHINE SULFATE (PF) 2 MG/ML IV SOLN
2.0000 mg | INTRAVENOUS | Status: AC | PRN
  Administered 2019-11-08: 2 mg via INTRAVENOUS
  Filled 2019-11-08: qty 1

## 2019-11-08 MED ORDER — METHOCARBAMOL 500 MG PO TABS: 500.0000 mg | ORAL_TABLET | Freq: Three times a day (TID) | ORAL | 0 refills | Status: AC | PRN

## 2019-11-08 MED ORDER — METHOCARBAMOL 500 MG PO TABS
500.0000 mg | ORAL_TABLET | Freq: Four times a day (QID) | ORAL | Status: DC | PRN
  Administered 2019-11-08 – 2019-11-09 (×5): 500 mg via ORAL
  Filled 2019-11-08 (×4): qty 1

## 2019-11-08 MED ORDER — ALPRAZOLAM 0.5 MG PO TABS
1.0000 mg | ORAL_TABLET | Freq: Every evening | ORAL | Status: DC | PRN
  Administered 2019-11-08: 1 mg via ORAL
  Filled 2019-11-08: qty 2

## 2019-11-08 MED ORDER — HEPARIN SODIUM (PORCINE) 1000 UNIT/ML IJ SOLN
INTRAMUSCULAR | Status: DC | PRN
  Administered 2019-11-08: 5000 [IU]

## 2019-11-08 MED ORDER — SUGAMMADEX SODIUM 200 MG/2ML IV SOLN
INTRAVENOUS | Status: DC | PRN
  Administered 2019-11-08: 200 mg via INTRAVENOUS

## 2019-11-08 MED ORDER — FENTANYL CITRATE (PF) 100 MCG/2ML IJ SOLN
25.0000 ug | INTRAMUSCULAR | Status: DC | PRN
  Administered 2019-11-08 (×4): 25 ug via INTRAVENOUS
  Administered 2019-11-08: 11:00:00 50 ug via INTRAVENOUS

## 2019-11-08 MED ORDER — ONDANSETRON HCL 4 MG PO TABS: 4.0000 mg | ORAL_TABLET | Freq: Four times a day (QID) | ORAL | Status: DC | PRN

## 2019-11-08 MED ORDER — GABAPENTIN 600 MG PO TABS
600.0000 mg | ORAL_TABLET | Freq: Three times a day (TID) | ORAL | Status: DC
  Administered 2019-11-08 – 2019-11-09 (×4): 600 mg via ORAL
  Filled 2019-11-08 (×4): qty 1

## 2019-11-08 MED ORDER — MAGNESIUM CITRATE PO SOLN
1.0000 | Freq: Once | ORAL | Status: AC | PRN
  Administered 2019-11-08: 21:00:00 1 via ORAL
  Filled 2019-11-08: qty 296

## 2019-11-08 MED ORDER — ARTHREX ANGEL - ACD-A SOLUTION (CHARTING ONLY) OPTIME
TOPICAL | Status: DC | PRN
  Administered 2019-11-08: 12 mL via TOPICAL

## 2019-11-08 MED ORDER — FENTANYL CITRATE (PF) 100 MCG/2ML IJ SOLN
INTRAMUSCULAR | Status: AC
  Filled 2019-11-08: qty 2

## 2019-11-08 MED ORDER — DEXAMETHASONE SODIUM PHOSPHATE 10 MG/ML IJ SOLN
INTRAMUSCULAR | Status: DC | PRN
  Administered 2019-11-08: 5 mg via INTRAVENOUS

## 2019-11-08 MED ORDER — BUPIVACAINE LIPOSOME 1.3 % IJ SUSP
20.0000 mL | INTRAMUSCULAR | Status: DC
  Filled 2019-11-08: qty 20

## 2019-11-08 MED ORDER — ENOXAPARIN SODIUM 40 MG/0.4ML ~~LOC~~ SOLN: 40.0000 mg | SUBCUTANEOUS | 0 refills | Status: DC

## 2019-11-08 MED ORDER — POLYETHYLENE GLYCOL 3350 17 G PO PACK: 17.0000 g | PACK | Freq: Every day | ORAL | Status: DC | PRN

## 2019-11-08 MED ORDER — HEMOSTATIC AGENTS (NO CHARGE) OPTIME
TOPICAL | Status: DC | PRN
  Administered 2019-11-08: 1 via TOPICAL

## 2019-11-08 MED ORDER — LIDOCAINE 2% (20 MG/ML) 5 ML SYRINGE
INTRAMUSCULAR | Status: DC | PRN
  Administered 2019-11-08: 20 mg via INTRAVENOUS

## 2019-11-08 MED ORDER — ACETAMINOPHEN 10 MG/ML IV SOLN
INTRAVENOUS | Status: AC
  Filled 2019-11-08: qty 100

## 2019-11-08 MED ORDER — VANCOMYCIN HCL IN DEXTROSE 1-5 GM/200ML-% IV SOLN
1000.0000 mg | INTRAVENOUS | Status: DC
  Filled 2019-11-08: qty 200

## 2019-11-08 MED ORDER — ONDANSETRON HCL 4 MG/2ML IJ SOLN
4.0000 mg | Freq: Four times a day (QID) | INTRAMUSCULAR | Status: DC | PRN
  Administered 2019-11-08: 23:00:00 4 mg via INTRAVENOUS
  Filled 2019-11-08: qty 2

## 2019-11-08 MED ORDER — PROPOFOL 10 MG/ML IV BOLUS
INTRAVENOUS | Status: AC
  Filled 2019-11-08: qty 20

## 2019-11-08 MED ORDER — ONDANSETRON HCL 4 MG/2ML IJ SOLN
INTRAMUSCULAR | Status: AC
  Filled 2019-11-08: qty 2

## 2019-11-08 MED ORDER — PROMETHAZINE HCL 25 MG/ML IJ SOLN: 6.2500 mg | INTRAMUSCULAR | Status: DC | PRN

## 2019-11-08 MED ORDER — PHENOL 1.4 % MT LIQD: 1.0000 | OROMUCOSAL | Status: DC | PRN

## 2019-11-08 MED ORDER — PHENYLEPHRINE 40 MCG/ML (10ML) SYRINGE FOR IV PUSH (FOR BLOOD PRESSURE SUPPORT)
PREFILLED_SYRINGE | INTRAVENOUS | Status: DC | PRN
  Administered 2019-11-08 (×4): 40 ug via INTRAVENOUS

## 2019-11-08 MED ORDER — LACTATED RINGERS IV SOLN: INTRAVENOUS | Status: DC

## 2019-11-08 MED ORDER — BUPIVACAINE LIPOSOME 1.3 % IJ SUSP
INTRAMUSCULAR | Status: DC | PRN
  Administered 2019-11-08: 10 mL

## 2019-11-08 MED ORDER — FENTANYL CITRATE (PF) 100 MCG/2ML IJ SOLN
INTRAMUSCULAR | Status: DC | PRN
  Administered 2019-11-08 (×3): 50 ug via INTRAVENOUS
  Administered 2019-11-08: 100 ug via INTRAVENOUS
  Administered 2019-11-08: 50 ug via INTRAVENOUS

## 2019-11-08 MED ORDER — SODIUM CHLORIDE 0.9% FLUSH
3.0000 mL | Freq: Two times a day (BID) | INTRAVENOUS | Status: DC
  Administered 2019-11-08 (×2): 3 mL via INTRAVENOUS

## 2019-11-08 MED ORDER — ONDANSETRON HCL 4 MG PO TABS: 4.0000 mg | ORAL_TABLET | Freq: Three times a day (TID) | ORAL | 0 refills | Status: DC | PRN

## 2019-11-08 MED ORDER — ROCURONIUM BROMIDE 50 MG/5ML IV SOSY
PREFILLED_SYRINGE | INTRAVENOUS | Status: DC | PRN
  Administered 2019-11-08 (×2): 15 mg via INTRAVENOUS
  Administered 2019-11-08: 30 mg via INTRAVENOUS
  Administered 2019-11-08: 40 mg via INTRAVENOUS

## 2019-11-08 MED ORDER — 0.9 % SODIUM CHLORIDE (POUR BTL) OPTIME
TOPICAL | Status: DC | PRN
  Administered 2019-11-08 (×2): 1000 mL

## 2019-11-08 MED ORDER — CHLORHEXIDINE GLUCONATE 4 % EX LIQD: 60.0000 mL | Freq: Once | CUTANEOUS | Status: DC

## 2019-11-08 MED ORDER — METHOCARBAMOL 1000 MG/10ML IJ SOLN
500.0000 mg | Freq: Four times a day (QID) | INTRAVENOUS | Status: DC | PRN
  Filled 2019-11-08: qty 5

## 2019-11-08 MED ORDER — METHOCARBAMOL 500 MG PO TABS
ORAL_TABLET | ORAL | Status: AC
  Filled 2019-11-08: qty 1

## 2019-11-08 MED ORDER — BUPIVACAINE HCL (PF) 0.25 % IJ SOLN
INTRAMUSCULAR | Status: DC | PRN
  Administered 2019-11-08: 20 mL

## 2019-11-08 MED ORDER — CEFAZOLIN SODIUM-DEXTROSE 1-4 GM/50ML-% IV SOLN
1.0000 g | Freq: Three times a day (TID) | INTRAVENOUS | Status: AC
  Administered 2019-11-08 (×2): 1 g via INTRAVENOUS
  Filled 2019-11-08 (×2): qty 50

## 2019-11-08 MED ORDER — PHENYLEPHRINE 40 MCG/ML (10ML) SYRINGE FOR IV PUSH (FOR BLOOD PRESSURE SUPPORT)
PREFILLED_SYRINGE | INTRAVENOUS | Status: AC
  Filled 2019-11-08: qty 10

## 2019-11-08 MED ORDER — SODIUM CHLORIDE 0.9% FLUSH: 3.0000 mL | INTRAVENOUS | Status: DC | PRN

## 2019-11-08 MED ORDER — LACTATED RINGERS IV SOLN: INTRAVENOUS | Status: DC | PRN

## 2019-11-08 MED ORDER — HEPARIN SODIUM (PORCINE) 1000 UNIT/ML IJ SOLN
INTRAMUSCULAR | Status: AC
  Filled 2019-11-08: qty 1

## 2019-11-08 MED ORDER — DULOXETINE HCL 30 MG PO CPEP
30.0000 mg | ORAL_CAPSULE | Freq: Two times a day (BID) | ORAL | Status: DC
  Administered 2019-11-08: 21:00:00 30 mg via ORAL
  Filled 2019-11-08 (×2): qty 1

## 2019-11-08 MED ORDER — MENTHOL 3 MG MT LOZG: 1.0000 | LOZENGE | OROMUCOSAL | Status: DC | PRN

## 2019-11-08 SURGICAL SUPPLY — 103 items
APPLIER CLIP 11 MED OPEN (CLIP) ×3
BLADE CLIPPER SURG (BLADE) ×3 IMPLANT
BLADE SURG 10 STRL SS (BLADE) ×3 IMPLANT
BUR EGG ELITE 4.0 (BURR) ×2 IMPLANT
BUR EGG ELITE 4.0MM (BURR) ×1
CABLE BIPOLOR RESECTION CORD (MISCELLANEOUS) IMPLANT
CATH FOLEY 2WAY SLVR  5CC 16FR (CATHETERS)
CATH FOLEY 2WAY SLVR 5CC 16FR (CATHETERS) IMPLANT
CATH FOLEY LATEX FREE 16FR (CATHETERS) ×2
CATH FOLEY LF 16FR (CATHETERS) ×1 IMPLANT
CLIP APPLIE 11 MED OPEN (CLIP) ×1 IMPLANT
CLIP LIGATING EXTRA MED SLVR (CLIP) ×3 IMPLANT
CLIP LIGATING EXTRA SM BLUE (MISCELLANEOUS) ×3 IMPLANT
CLOSURE STERI-STRIP 1/2X4 (GAUZE/BANDAGES/DRESSINGS) ×2
CLOSURE WOUND 1/2 X4 (GAUZE/BANDAGES/DRESSINGS) ×1
CLSR STERI-STRIP ANTIMIC 1/2X4 (GAUZE/BANDAGES/DRESSINGS) ×4 IMPLANT
COVER SURGICAL LIGHT HANDLE (MISCELLANEOUS) IMPLANT
COVER WAND RF STERILE (DRAPES) IMPLANT
DERMABOND ADVANCED (GAUZE/BANDAGES/DRESSINGS) ×2
DERMABOND ADVANCED .7 DNX12 (GAUZE/BANDAGES/DRESSINGS) ×1 IMPLANT
DRAPE C-ARM 42X72 X-RAY (DRAPES) ×3 IMPLANT
DRAPE C-ARMOR (DRAPES) ×3 IMPLANT
DRAPE POUCH INSTRU U-SHP 10X18 (DRAPES) ×3 IMPLANT
DRAPE SURG 17X23 STRL (DRAPES) ×3 IMPLANT
DRAPE U-SHAPE 47X51 STRL (DRAPES) ×3 IMPLANT
DRSG OPSITE POSTOP 4X8 (GAUZE/BANDAGES/DRESSINGS) ×3 IMPLANT
DURAPREP 26ML APPLICATOR (WOUND CARE) ×3 IMPLANT
ELECT BLADE 4.0 EZ CLEAN MEGAD (MISCELLANEOUS) ×3
ELECT CAUTERY BLADE 6.4 (BLADE) ×3 IMPLANT
ELECT PENCIL ROCKER SW 15FT (MISCELLANEOUS) IMPLANT
ELECT REM PT RETURN 9FT ADLT (ELECTROSURGICAL) ×3
ELECTRODE BLDE 4.0 EZ CLN MEGD (MISCELLANEOUS) ×1 IMPLANT
ELECTRODE REM PT RTRN 9FT ADLT (ELECTROSURGICAL) ×1 IMPLANT
GAUZE 4X4 16PLY RFD (DISPOSABLE) IMPLANT
GLOVE BIO SURGEON STRL SZ 6.5 (GLOVE) IMPLANT
GLOVE BIO SURGEON STRL SZ7.5 (GLOVE) IMPLANT
GLOVE BIO SURGEONS STRL SZ 6.5 (GLOVE)
GLOVE BIOGEL PI IND STRL 6.5 (GLOVE) IMPLANT
GLOVE BIOGEL PI IND STRL 8.5 (GLOVE) IMPLANT
GLOVE BIOGEL PI INDICATOR 6.5 (GLOVE)
GLOVE BIOGEL PI INDICATOR 8.5 (GLOVE)
GLOVE SS BIOGEL STRL SZ 7.5 (GLOVE) ×1 IMPLANT
GLOVE SS BIOGEL STRL SZ 8.5 (GLOVE) IMPLANT
GLOVE SS N UNI LF 6.5 STRL (GLOVE) ×6 IMPLANT
GLOVE SS N UNI LF 7.0 STRL (GLOVE) ×9 IMPLANT
GLOVE SS N UNI LF 7.5 STRL (GLOVE) ×12 IMPLANT
GLOVE SS N UNI LF 8.5 STRL (GLOVE) ×6 IMPLANT
GLOVE SUPERSENSE BIOGEL SZ 7.5 (GLOVE) ×2
GLOVE SUPERSENSE BIOGEL SZ 8.5 (GLOVE)
GOWN STRL REUS W/ TWL LRG LVL3 (GOWN DISPOSABLE) ×1 IMPLANT
GOWN STRL REUS W/TWL 2XL LVL3 (GOWN DISPOSABLE) ×9 IMPLANT
GOWN STRL REUS W/TWL LRG LVL3 (GOWN DISPOSABLE) ×2
HEMOSTAT SNOW SURGICEL 2X4 (HEMOSTASIS) IMPLANT
INSERT FOGARTY 61MM (MISCELLANEOUS) IMPLANT
INSERT FOGARTY SM (MISCELLANEOUS) IMPLANT
INTERPLATE 39X14X12 (Plate) ×3 IMPLANT
KIT BASIN OR (CUSTOM PROCEDURE TRAY) ×3 IMPLANT
KIT BONE MARROW PROCESS ANGEL (KITS) ×3 IMPLANT
KIT BONE MRW ASP ANGEL CPRP (KITS) ×3 IMPLANT
KIT TURNOVER KIT B (KITS) ×3 IMPLANT
LOOP VESSEL MAXI BLUE (MISCELLANEOUS) IMPLANT
LOOP VESSEL MINI RED (MISCELLANEOUS) IMPLANT
MATRIX HEMOSTAT SURGIFLO (HEMOSTASIS) ×3 IMPLANT
NEEDLE SPNL 18GX3.5 QUINCKE PK (NEEDLE) ×3 IMPLANT
NS IRRIG 1000ML POUR BTL (IV SOLUTION) ×6 IMPLANT
PACK LAMINECTOMY ORTHO (CUSTOM PROCEDURE TRAY) ×3 IMPLANT
PACK UNIVERSAL I (CUSTOM PROCEDURE TRAY) ×3 IMPLANT
PAD ARMBOARD 7.5X6 YLW CONV (MISCELLANEOUS) IMPLANT
PEEK SPACER INTERPLAT 35X14X12 (Peek) ×3 IMPLANT
PLATE SPINAL INTERPLT 39X14X12 (Plate) ×1 IMPLANT
PUTTY DBM ALLOSYNC PURE 10CC (Putty) ×3 IMPLANT
SCREW BONE RESCUE (Screw) ×9 IMPLANT
SCREW SPINAL STD (Orthopedic Implant) ×3 IMPLANT
SPONGE INTESTINAL PEANUT (DISPOSABLE) ×6 IMPLANT
SPONGE LAP 18X18 RF (DISPOSABLE) IMPLANT
SPONGE LAP 4X18 RFD (DISPOSABLE) IMPLANT
SPONGE SURGIFOAM ABS GEL 100 (HEMOSTASIS) IMPLANT
STAPLER VISISTAT 35W (STAPLE) IMPLANT
STRIP CLOSURE SKIN 1/2X4 (GAUZE/BANDAGES/DRESSINGS) ×2 IMPLANT
SURGIFLO W/THROMBIN 8M KIT (HEMOSTASIS) ×3 IMPLANT
SUT BONE WAX W31G (SUTURE) ×3 IMPLANT
SUT MON AB 3-0 SH 27 (SUTURE) ×2
SUT MON AB 3-0 SH27 (SUTURE) ×1 IMPLANT
SUT PDS AB 1 CTX 36 (SUTURE) ×6 IMPLANT
SUT PROLENE 4 0 RB 1 (SUTURE)
SUT PROLENE 4-0 RB1 .5 CRCL 36 (SUTURE) IMPLANT
SUT PROLENE 5 0 C 1 24 (SUTURE) IMPLANT
SUT PROLENE 5 0 CC1 (SUTURE) IMPLANT
SUT PROLENE 6 0 C 1 30 (SUTURE) ×3 IMPLANT
SUT PROLENE 6 0 CC (SUTURE) IMPLANT
SUT SILK 0 TIES 10X30 (SUTURE) ×3 IMPLANT
SUT SILK 2 0 TIES 10X30 (SUTURE) ×3 IMPLANT
SUT SILK 2 0SH CR/8 30 (SUTURE) IMPLANT
SUT SILK 3 0 TIES 10X30 (SUTURE) ×3 IMPLANT
SUT SILK 3 0SH CR/8 30 (SUTURE) IMPLANT
SUT VIC AB 1 CT1 27 (SUTURE) ×4
SUT VIC AB 1 CT1 27XBRD ANBCTR (SUTURE) ×2 IMPLANT
SUT VIC AB 2-0 CT1 18 (SUTURE) ×6 IMPLANT
SYR BULB IRRIGATION 50ML (SYRINGE) ×3 IMPLANT
TOWEL GREEN STERILE (TOWEL DISPOSABLE) ×6 IMPLANT
TOWEL GREEN STERILE FF (TOWEL DISPOSABLE) ×3 IMPLANT
TRAP SPECIMEN MUCOUS 40CC (MISCELLANEOUS) ×3 IMPLANT
WATER STERILE IRR 1000ML POUR (IV SOLUTION) ×3 IMPLANT

## 2019-11-08 NOTE — Transfer of Care (Signed)
Immediate Anesthesia Transfer of Care Note  Patient: Shelia Snyder  Procedure(s) Performed: ANTERIOR LUMBAR FUSION LUMBAR FIVE-SACRAL ONE (N/A ) ABDOMINAL EXPOSURE (N/A )  Patient Location: PACU  Anesthesia Type:GA combined with regional for post-op pain  Level of Consciousness: awake, alert  and oriented  Airway & Oxygen Therapy: Patient Spontanous Breathing and Patient connected to nasal cannula oxygen  Post-op Assessment: Report given to RN, Post -op Vital signs reviewed and stable and Patient moving all extremities X 4  Post vital signs: Reviewed and stable  Last Vitals:  Vitals Value Taken Time  BP 124/77 11/08/19 1051  Temp    Pulse 102 11/08/19 1051  Resp 22 11/08/19 1051  SpO2 97 % 11/08/19 1051  Vitals shown include unvalidated device data.  Last Pain:  Vitals:   11/08/19 0621  TempSrc: Oral  PainSc:          Complications: No apparent anesthesia complications

## 2019-11-08 NOTE — H&P (Signed)
Addendum H&P by Dr. Rolena Infante.  There is been no change in patient's clinical exam since her last office visit of 10/30/2019.  She continues to have significant back buttock and lower extremity as well as intermittent dysesthesias.  Imaging studies confirm slight spondylolisthesis at L5-S1 with degenerative lumbar disc disease.  This results in lateral recess and foraminal stenosis.  Patient has had a prior L5-S1 left laminotomy and decompression.  Surgical plan was reviewed with the patient this is an anterior lumbar interbody fusion of L5-S1.  The risks benefits and alternatives of surgery were again reviewed and all of her questions were encouraged and addressed.

## 2019-11-08 NOTE — Anesthesia Procedure Notes (Signed)
Anesthesia Regional Block: TAP block   Pre-Anesthetic Checklist: ,, timeout performed, Correct Patient, Correct Site, Correct Laterality, Correct Procedure, Correct Position, site marked, Risks and benefits discussed,  Surgical consent,  Pre-op evaluation,  At surgeon's request and post-op pain management  Laterality: Left  Prep: chloraprep       Needles:  Injection technique: Single-shot  Needle Type: Echogenic Needle     Needle Length: 9cm  Needle Gauge: 21     Additional Needles:   Procedures:,,,, ultrasound used (permanent image in chart),,,,  Narrative:  Start time: 11/08/2019 7:15 AM End time: 11/08/2019 7:20 AM Injection made incrementally with aspirations every 5 mL.  Performed by: Personally  Anesthesiologist: Suzette Battiest, MD

## 2019-11-08 NOTE — Op Note (Signed)
Operative report  Preoperative diagnosis: Postlaminectomy syndrome with degenerative lumbar disc disease L5-S1 and lumbar radiculopathy.  Postoperative diagnosis: Same  Operative procedure: Anterior lumbar interbody fusion L5-S1  Approach surgeon: Dr. Sherren Mocha early  First Assistant: Cleta Alberts, PA  Implants: RSB peek intervertebral cage.  14 mm height 12 degree lordotic.  Corresponding 0 profile anterior plate affixed with 30 mm length screws.  Allograft: allosync pure with BMA  Complications: None  Indications: Shelia Snyder is a very pleasant 53 year old woman who is having progressive back buttock and bilateral lower extremity radicular pain.  She has had a previous lumbar microdiscectomy and has gone on to have's progressive low back pain.  Imaging studies demonstrated degenerative lumbar disc disease with slight anterior listhesis.  Attempts at conservative management had failed to alleviate her pain or improve her quality of life.  As result we elected to move forward with surgery.  All appropriate risks benefits and alternatives were discussed with the patient and consent was obtained.  Operative report: Patient is brought the operating room placed upon the operating room table.  After successful induction of general anesthesia and endotracheal ovation teds SCDs and a Foley were inserted.  The anterior abdomen was then prepped and draped in a standard fashion.  Timeout was taken to confirm patient procedure and all other important data.  At this point time Dr. Donnetta Hutching performed a standard anterior retroperitoneal approach to the lumbar spine.  Please refer to his dictation for specifics.  Once the approach was complete he placed the Thompson retractors to completely expose the L5-S1 disc space level.  A needle was placed into the disc space and x-ray was taken confirming that I was at the appropriate level.  At this point I scrubbed back into the case and we proceeded with the discectomy and fusion.   An annulotomy was performed with a 10 blade scalpel and a combination of pituitary rongeurs curettes and Kerrison rongeurs I gradually removed all of the disc material.  I then used curettes to remove the cartilaginous endplate and expose the bleeding subchondral bone.  I continue to work posteriorly until I was able to pass my curved small curette behind the S1 vertebral body and released the posterior annulus.  I was also able to do this behind the L5 vertebral body.  I continued using my 2 mm Kerrison rongeur removing the fragments of disc material and osteophyte.  At this point I placed a lamina spreader into the disc space and under live fluoroscopy began distracting the intervertebral space.  I confirmed that I had parallel endplate distraction and that the foraminal volume was increasing.  At this point I was pleased with the overall discectomy.  I then obtained the trial implants and used a 14 large rasp 12 degree lordotic trial.  This provided the best overall fit in the disc space.  I obtained the corresponding peek intervertebral cage packed it with the allograft and malleted into position.  The 0 profile plate was then secured over the cage.  Once it was malleted down to the appropriate depth I then used the awl and placed 2 locking screws superiorly into the L5 vertebral body and one locking screw inferiorly into the S1 vertebral body all screws had excellent purchase.  I then placed the locking caps to prevent migration of the screws.  This was torqued off and locked in according manufacture standards.  At this point I copiously irrigated the wound with normal saline and then made sure that hemostasis using bipolar  electrocautery as well as FloSeal.  I then sequentially remove the retractor blades confirming that there was no active bleeding.  Once all the retractors were removed final intraoperative AP lateral fluoroscopy views were taken.  I had satisfactory positioning of my intervertebral cage and  the independent plate and screw fixation in both planes.  I had restoration of the posterior intervertebral disc height thereby confirming indirect foraminal decompression.  At this point I closed the fascia of the rectus with a running #1 PDS stitch.  We then closed sequentially with a 0 Vicryl runner, 2-0 Vicryl suture, and a 3-0 Monocryl stitch.  Steri-Strips and dry dressings were applied and the patient was ultimately extubated transferred to PACU without incident.  The end of the case all needle sponge counts were correct.  Final intraoperative x-ray digital spot was read by the radiologist and cleared.

## 2019-11-08 NOTE — Brief Op Note (Signed)
11/08/2019  10:29 AM  PATIENT:  Shelia Snyder  53 y.o. female  PRE-OPERATIVE DIAGNOSIS:  Post laminectomy syndrome with degenerative disc disease Lumbar five sacral one  POST-OPERATIVE DIAGNOSIS:  Post laminectomy syndrome with degenerative disc disease Lumbar five sacral one  PROCEDURE:  Procedure(s): ANTERIOR LUMBAR FUSION LUMBAR FIVE-SACRAL ONE (N/A) ABDOMINAL EXPOSURE (N/A)  SURGEON:  Surgeon(s) and Role: Panel 1:    Melina Schools, MD - Primary Panel 2:    * Early, Arvilla Meres, MD - Primary  PHYSICIAN ASSISTANT:   ASSISTANTS: Amanda Ward, PA   ANESTHESIA:   general  EBL:  150 mL   BLOOD ADMINISTERED:none  DRAINS: none   LOCAL MEDICATIONS USED:  NONE  SPECIMEN:  No Specimen  DISPOSITION OF SPECIMEN:  N/A  COUNTS:  YES  TOURNIQUET:  * No tourniquets in log *  DICTATION: .Dragon Dictation  PLAN OF CARE: Admit to inpatient   PATIENT DISPOSITION:  PACU - hemodynamically stable.

## 2019-11-08 NOTE — Evaluation (Signed)
Occupational Therapy Evaluation Patient Details Name: Shelia Snyder MRN: [id number] DOB: [birth year] Today's Date: 11/08/2019    History of Present Illness Pt is a 53 y/o female s/p ALIF L5-S1. PMH: osteoporosis, thrombocytopenia, IBS, anxiety, chronic low back pain, depression, L4-5 discectomy.    Clinical Impression   PTA patient independent. Admitted for above and limited by problem list below, including pain and back precautions. Educated on brace mgmt/wear schedule, back precautions, ADL compensatory techniques, recommendations and safety.  She completes bed mobility with supervision, transfers with min guard, LB ADLs with min assist, brace mgmt with min cueing, and in room mobility with min guard using IV pole to steady.  Patient has support of spouse and son at discharge, who can assist as needed. Patient will benefit from continued OT services while admitted to optimize independence in ADLs, but anticipate no further needs after dc home.     Follow Up Recommendations  No OT follow up;Supervision - Intermittent    Equipment Recommendations  None recommended by OT    Recommendations for Other Services       Precautions / Restrictions Precautions Precautions: Back Precaution Booklet Issued: Yes (comment) Precaution Comments: reviewed precuations  Required Braces or Orthoses: Spinal Brace Spinal Brace: Lumbar corset;Applied in sitting position Restrictions Weight Bearing Restrictions: No      Mobility Bed Mobility Overal bed mobility: Needs Assistance Bed Mobility: Rolling;Sidelying to Sit;Sit to Sidelying Rolling: Supervision Sidelying to sit: Supervision     Sit to sidelying: Supervision General bed mobility comments: log roll technique cueing only  Transfers Overall transfer level: Needs assistance Equipment used: None Transfers: Sit to/from Stand Sit to Stand: Min guard         General transfer comment: for safety and balance    Balance Overall balance  assessment: Mild deficits observed, not formally tested                                         ADL either performed or assessed with clinical judgement   ADL Overall ADL's : Needs assistance/impaired     Grooming: Supervision/safety;Standing   Upper Body Bathing: Set up;Sitting   Lower Body Bathing: Minimal assistance;Sit to/from stand   Upper Body Dressing : Set up;Sitting   Lower Body Dressing: Minimal assistance;Sit to/from stand   Toilet Transfer: Ambulation;Min Psychiatric nurse Details (indicate cue type and reason): holding IV pole         Functional mobility during ADLs: Min guard(IV pole) General ADL Comments: pt educated on back precautions, ADL compensatory techniques, brace mgmt and wear schedule, safety and recommendations; requires min assist for LB ADLs reaching feet during figure 4 technique and will have support of spouse      Vision   Vision Assessment?: No apparent visual deficits     Perception     Praxis      Pertinent Vitals/Pain Pain Assessment: Faces Faces Pain Scale: Hurts even more Pain Location: incisional, abdomen Pain Descriptors / Indicators: Discomfort;Grimacing;Operative site guarding Pain Intervention(s): Limited activity within patient's tolerance;Monitored during session;Repositioned;Ice applied;Other (comment)(used bracing with mobility)     Hand Dominance     Extremity/Trunk Assessment Upper Extremity Assessment Upper Extremity Assessment: Overall WFL for tasks assessed   Lower Extremity Assessment Lower Extremity Assessment: Defer to PT evaluation   Cervical / Trunk Assessment Cervical / Trunk Assessment: Other exceptions Cervical / Trunk Exceptions: s/p back surgery   Communication Communication  Communication: No difficulties   Cognition Arousal/Alertness: Awake/alert Behavior During Therapy: WFL for tasks assessed/performed Overall Cognitive Status: Within Functional Limits for tasks  assessed                                     General Comments       Exercises     Shoulder Instructions      Home Living Family/patient expects to be discharged to:: Private residence Living Arrangements: Spouse/significant other;Children(son 54 y/o) Available Help at Discharge: Family;Available 24 hours/day Type of Home: House Home Access: Stairs to enter [employer] of Steps: 1   Home Layout: Two level Alternate Level Stairs-Number of Steps: 16 Alternate Level Stairs-Rails: Right Bathroom Shower/Tub: Producer, television/film/video: Standard     Home Equipment: Environmental consultant - 2 wheels;Cane - single point;Bedside commode;Shower seat - built in          Prior Functioning/Environment Level of Independence: Independent                 OT Problem List: Decreased activity tolerance;Decreased knowledge of use of DME or AE;Decreased knowledge of precautions;Pain      OT Treatment/Interventions: Self-care/ADL training;DME and/or AE instruction;Patient/family education;Balance training;Therapeutic activities    OT Goals(Current goals can be found in the care plan section) Acute Rehab OT Goals Patient Stated Goal: less pain  OT Goal Formulation: With patient Time For Goal Achievement: 11/22/19 Potential to Achieve Goals: Good  OT Frequency: Min 2X/week   Barriers to D/C:            Co-evaluation              AM-PAC OT "6 Clicks" Daily Activity     Outcome Measure Help from another person eating meals?: None Help from another person taking care of personal grooming?: A Little Help from another person toileting, which includes using toliet, bedpan, or urinal?: A Little Help from another person bathing (including washing, rinsing, drying)?: A Little Help from another person to put on and taking off regular upper body clothing?: A Little Help from another person to put on and taking off regular lower body clothing?: A Little 6 Click  Score: 19   End of Session Equipment Utilized During Treatment: Back brace Nurse Communication: Mobility status  Activity Tolerance: Patient tolerated treatment well Patient left: in bed;with call bell/phone within reach;with SCD's reapplied  OT Visit Diagnosis: Other abnormalities of gait and mobility (R26.89);Pain Pain - part of body: (incisonal)                Time: 4166-0630 OT Time Calculation (min): 33 min Charges:  OT General Charges $OT Visit: 1 Visit OT Evaluation $OT Eval Low Complexity: 1 Low OT Treatments $Self Care/Home Management : 8-22 mins  Barry Brunner, OT Acute Rehabilitation Services Pager [phone] Office [phone]   Chancy Milroy 11/08/2019, 4:44 PM

## 2019-11-08 NOTE — Discharge Instructions (Signed)
°Spinal Fusion, Adult, Care After °This sheet gives you information about how to care for yourself after your procedure. Your doctor may also give you more specific instructions. If you have problems or questions, contact your doctor. °Follow these instructions at home: °Medicines °· Take over-the-counter and prescription medicines only as told by your doctor. These include any medicines for pain or blood-thinning medicines (anticoagulants). °· If you were prescribed an antibiotic medicine, take it as told by your doctor. Do not stop taking the antibiotic even if you start to feel better. °· Do not drive for 24 hours if you were given a medicine to help you relax (sedative) during your procedure. °· Do not drive or use heavy machinery while taking prescription pain medicine. °If you have a brace: °· Wear the brace as told by your doctor. Take it off only as told by your doctor. °· Keep the brace clean. °Managing pain, stiffness, and swelling °· If directed, put ice on the surgery area: °? If you have a removable brace, take it off as told by your doctor. °? Put ice in a plastic bag. °? Place a towel between your skin and the bag. °? Leave the ice on for 20 minutes, 2-3 times a day. °Surgery cut care ° °  °· Follow instructions from your doctor about how to take care of your cut from surgery (incision). Make sure you: °? Wash your hands with soap and water before you change your bandage (dressing). If you cannot use soap and water, use hand sanitizer. °? Change your bandage as told by your doctor. °? Leave stitches (sutures), skin glue, or skin tape (adhesive) strips in place. They may need to stay in place for 2 weeks or longer. If tape strips get loose and curl up, you may trim the loose edges. Do not remove tape strips completely unless your doctor says it is okay. °· Keep your cut from surgery clean and dry. °? Do not take baths, swim, or use a hot tub until your doctor says it is okay. °? Ask your doctor if you  can take showers. You may only be allowed to take sponge baths. °· Every day, check your cut from surgery and the area around it for: °? More redness, swelling, or pain. °? Fluid or blood. °? Warmth. °? Pus or a bad smell. °· If you have a drain tube, follow instructions from your doctor about caring for it. Do not take out the drain tube or any bandages unless your doctor says it is okay. °Physical activity °· Rest and protect your back as much as possible. °· Follow instructions from your doctor about how to move. Use good posture to help your spine heal. °· Do not lift anything that is heavier than 8 lb (3.6 kg), or the limit that you are told, until your doctor says that it is safe. °· Do not twist or bend at the waist until your doctor says it is okay. °· It is best if you: °? Do not make pushing and pulling motions. °? Do not sit or lie down in the same position for a long time. °? Do not raise your hands or arms above your head. °· Return to your normal activities as told by your doctor. Ask your doctor what activities are safe for you. Rest and protect your back as much as you can. °· Do not start to exercise until your doctor says it is okay. Ask your doctor what kinds of exercise you   can do to make your back stronger. °· Ok to shower in 5 days.  Do not take a bath or submerge the wound °General instructions °· To prevent blood clots and lessen swelling in your legs: °? Wear compression stockings as told. °? Walk one or more times every few hours as told by your doctor. °· Do not use any products that contain nicotine or tobacco, such as cigarettes and e-cigarettes. These can delay bone healing. If you need help quitting, ask your doctor. °· To prevent or treat constipation while you are taking prescription pain medicine, your doctor may suggest that you: °? Drink enough fluid to keep your pee (urine) pale yellow. °? Take over-the-counter or prescription medicines. °? Eat foods that are high in fiber. These  include fresh fruits and vegetables, whole grains, and beans. °? Limit foods that are high in fat and processed sugars, such as fried and sweet foods. °· Keep all follow-up visits as told by your doctor. This is important. °Contact a doctor if: °· Your pain gets worse. °· Your medicine does not help your pain. °· Your legs or feet get painful or swollen. °· Your cut from surgery is more red, swollen, or painful. °· Your cut from surgery feels warm to the touch. °· You have: °? Fluid or blood coming from your cut from surgery. °? Pus or a bad smell coming from your cut from surgery. °? A fever. °? Weakness or loss of feeling (numbness) in your legs that is new or getting worse. °? Trouble controlling when you pee (urinate) or poop (have a bowel movement). °· You feel sick to your stomach (nauseous). °· You throw up (vomit). °Get help right away if: °· Your pain is very bad. °· You have chest pain. °· You have trouble breathing. °· You start to have a cough. °These symptoms may be an emergency. Do not wait to see if the symptoms will go away. Get medical help right away. Call your local emergency services (911 in the [place] Do not drive yourself to the hospital. °Summary °· After the procedure, it is common to have pain in your back and pain by your surgery cut(s). °· Icing and pain medicines may help to control the pain. Follow directions from your doctor. °· Rest and protect your back as much as possible. Do not twist or bend at the waist. °· Get up and walk one or more times every few hours as told by your doctor. °This information is not intended to replace advice given to you by your health care provider. Make sure you discuss any questions you have with your health care provider. ° °Enoxaparin injection °What is this medicine? °ENOXAPARIN (ee nox a PA rin) is used after knee, hip, or abdominal surgeries to prevent blood clotting. It is also used to treat existing blood clots in the lungs or in the veins. °This  medicine may be used for other purposes; ask your health care provider or pharmacist if you have questions. °COMMON BRAND NAME(S): Lovenox °What should I tell my health care provider before I take this medicine? °They need to know if you have any of these conditions: °-bleeding disorders, hemorrhage, or hemophilia °-infection of the heart or heart valves °-kidney or liver disease °-previous stroke °-prosthetic heart valve °-recent surgery or delivery of a baby °-ulcer in the stomach or intestine, diverticulitis, or other bowel disease °-an unusual or allergic reaction to enoxaparin, heparin, pork or pork products, other medicines, foods, dyes, or preservatives °-  pregnant or trying to get pregnant °-breast-feeding °How should I use this medicine? °This medicine is for injection under the skin. It is usually given by a health-care professional. You or a family member may be trained on how to give the injections. If you are to give yourself injections, make sure you understand how to use the syringe, measure the dose if necessary, and give the injection. To avoid bruising, do not rub the site where this medicine has been injected. Do not take your medicine more often than directed. Do not stop taking except on the advice of your doctor or health care professional. °Make sure you receive a puncture-resistant container to dispose of the needles and syringes once you have finished with them. Do not reuse these items. Return the container to your doctor or health care professional for proper disposal. °Talk to your pediatrician regarding the use of this medicine in children. Special care may be needed. °Overdosage: If you think you have taken too much of this medicine contact a poison control center or emergency room at once. °NOTE: This medicine is only for you. Do not share this medicine with others. °What if I miss a dose? °If you miss a dose, take it as soon as you can. If it is almost time for your next dose, take  only that dose. Do not take double or extra doses. °What may interact with this medicine? °-aspirin and aspirin-like medicines °-certain medicines that treat or prevent blood clots °-dipyridamole °-NSAIDs, medicines for pain and inflammation, like ibuprofen or naproxen °This list may not describe all possible interactions. Give your health care provider a list of all the medicines, herbs, non-prescription drugs, or dietary supplements you use. Also tell them if you smoke, drink alcohol, or use illegal drugs. Some items may interact with your medicine. °What should I watch for while using this medicine? °Visit your healthcare professional for regular checks on your progress. You may need blood work done while you are taking this medicine. Your condition will be monitored carefully while you are receiving this medicine. It is important not to miss any appointments. °If you are going to need surgery or other procedure, tell your healthcare professional that you are using this medicine. °Using this medicine for a long time may weaken your bones and increase the risk of bone fractures. °Avoid sports and activities that might cause injury while you are using this medicine. Severe falls or injuries can cause unseen bleeding. Be careful when using sharp tools or knives. Consider using an electric razor. Take special care brushing or flossing your teeth. Report any injuries, bruising, or red spots on the skin to your healthcare professional. °Wear a medical ID bracelet or chain. Carry a card that describes your disease and details of your medicine and dosage times. °What side effects may I notice from receiving this medicine? °Side effects that you should report to your doctor or health care professional as soon as possible: °-allergic reactions like skin rash, itching or hives, swelling of the face, lips, or tongue °-bone pain °-signs and symptoms of bleeding such as bloody or black, tarry stools; red or dark-brown urine;  spitting up blood or brown material that looks like coffee grounds; red spots on the skin; unusual bruising or bleeding from the eye, gums, or nose °-signs and symptoms of a blood clot such as chest pain; shortness of breath; pain, swelling, or warmth in the leg °-signs and symptoms of a stroke such as changes in vision; confusion; trouble   speaking or understanding; severe headaches; sudden numbness or weakness of the face, arm or leg; trouble walking; dizziness; loss of coordination °Side effects that usually do not require medical attention (report to your doctor or health care professional if they continue or are bothersome): °-hair loss °-pain, redness, or irritation at site where injected °This list may not describe all possible side effects. Call your doctor for medical advice about side effects. You may report side effects to FDA at 1-800-FDA-1088. °Where should I keep my medicine? °Keep out of the reach of children. °Store at room temperature between 15 and 30 degrees C (59 and 86 degrees F). Do not freeze. If your injections have been specially prepared, you may need to store them in the refrigerator. Ask your pharmacist. Throw away any unused medicine after the expiration date. °NOTE: This sheet is a summary. It may not cover all possible information. If you have questions about this medicine, talk to your doctor, pharmacist, or health care provider. °

## 2019-11-08 NOTE — Anesthesia Postprocedure Evaluation (Signed)
Anesthesia Post Note  Patient: Shelia Snyder  Procedure(s) Performed: ANTERIOR LUMBAR FUSION LUMBAR FIVE-SACRAL ONE (N/A ) ABDOMINAL EXPOSURE (N/A )     Patient location during evaluation: PACU Anesthesia Type: General Level of consciousness: awake and alert Pain management: pain level controlled Vital Signs Assessment: post-procedure vital signs reviewed and stable Respiratory status: spontaneous breathing, nonlabored ventilation, respiratory function stable and patient connected to nasal cannula oxygen Cardiovascular status: blood pressure returned to baseline and stable Postop Assessment: no apparent nausea or vomiting Anesthetic complications: no    Last Vitals:  Vitals:   11/08/19 1222 11/08/19 1555  BP: 131/78 128/78  Pulse: 79 68  Resp: 18 16  Temp: 36.6 C 36.4 C  SpO2: 98% 100%    Last Pain:  Vitals:   11/08/19 1426  TempSrc:   PainSc: 4                  Tiajuana Amass

## 2019-11-08 NOTE — Anesthesia Procedure Notes (Signed)
Arterial Line Insertion Start/End12/23/2020 7:15 AM, 11/08/2019 7:17 AM Performed by: Inda Coke, CRNA, CRNA  Preanesthetic checklist: patient identified, IV checked, site marked, risks and benefits discussed, surgical consent, monitors and equipment checked, pre-op evaluation, timeout performed and anesthesia consent Lidocaine 1% used for infiltration and patient sedated Right, radial was placed Catheter size: 20 G Hand hygiene performed  and maximum sterile barriers used  Allen's test indicative of satisfactory collateral circulation Attempts: 1 Procedure performed without using ultrasound guided technique. Following insertion, dressing applied and Biopatch. Post procedure assessment: normal  Patient tolerated the procedure well with no immediate complications.

## 2019-11-08 NOTE — Op Note (Signed)
    OPERATIVE REPORT  DATE OF SURGERY: 11/08/2019  PATIENT: Shelia Snyder, 53 y.o. female MRN: [id number]  DOB: 07-27-1966  PRE-OPERATIVE DIAGNOSIS: Degenerative disc disease  POST-OPERATIVE DIAGNOSIS:  Same  PROCEDURE: Anterior exposure for L5-S1 disc fusion  SURGEON:  Curt Jews, M.D.  Co-surgeon for the exposure Dr. Rolena Infante  PHYSICIAN ASSISTANT: Estill Bamberg Ward, PA-C  ANESTHESIA: General  EBL: per anesthesia record  Total I/O In: 2100 [I.V.:2100] Out: 625 [Urine:475; Blood:150]  BLOOD ADMINISTERED: none  DRAINS: none  SPECIMEN: none  COUNTS CORRECT:  YES  PATIENT DISPOSITION:  PACU - hemodynamically stable  PROCEDURE DETAILS: The patient was taken operating placed supine position where the area of the abdomen is prepped draped you sterile fashion.  C-arm was used to identify the level of the L5-S1 disc.  A incision was made from the level of the midline laterally to the left and carried down through the subcutaneous fat with electrocautery.  The anterior rectus sheath was opened in line with the skin incision.  The rectus muscle was mobilized medially.  The retroperitoneal space was entered bluntly and blunt dissection was continued to mobilize the intraperitoneal contents and left ureter to the right.  Blunt dissection was extended over the L5-S1 disc.  Middle sacral vessels were clipped and divided.  Blunt dissection was used to give adequate exposure to the right and left of the disc.  The Thompson retractor was brought onto the field and the reverse lip 150 blades were positioned to the right and left of the 5 S1 disc.  Malleable retractors were used for superior and inferior exposure.  Spinal needle was placed in the L5-S1 disc.  C-arm was brought back onto the for field to confirm this was the appropriate level.  The remainder the procedure will be dictated as a separate note by Dr. Lynnea Ferrier, M.D., [facility] 11/08/2019 11:37 AM

## 2019-11-08 NOTE — Progress Notes (Signed)
PT Cancellation Note  Patient Details Name: Shelia Snyder MRN: [id number] DOB: 1966/09/13   Cancelled Treatment:    Reason Eval/Treat Not Completed: Patient at procedure or test/unavailable; patient out of room for testing, will attempt another day.   Reginia Naas 11/08/2019, 3:33 PM Magda Kiel, [place] [phone] 11/08/2019

## 2019-11-08 NOTE — Anesthesia Procedure Notes (Signed)
Procedure Name: Intubation Date/Time: 11/08/2019 7:54 AM Performed by: Inda Coke, CRNA Pre-anesthesia Checklist: Patient identified, Emergency Drugs available, Suction available and Patient being monitored Patient Re-evaluated:Patient Re-evaluated prior to induction Oxygen Delivery Method: Circle System Utilized Preoxygenation: Pre-oxygenation with 100% oxygen Induction Type: IV induction Ventilation: Mask ventilation without difficulty Laryngoscope Size: Mac and 3 Grade View: Grade I Tube type: Oral Tube size: 7.0 mm Number of attempts: 1 Airway Equipment and Method: Stylet and Oral airway Placement Confirmation: ETT inserted through vocal cords under direct vision,  positive ETCO2 and breath sounds checked- equal and bilateral Secured at: 21 cm Tube secured with: Tape Dental Injury: Teeth and Oropharynx as per pre-operative assessment

## 2019-11-09 ENCOUNTER — Encounter: Payer: Self-pay | Admitting: *Deleted

## 2019-11-09 DIAGNOSIS — M1990 Unspecified osteoarthritis, unspecified site: Secondary | ICD-10-CM | POA: Diagnosis present

## 2019-11-09 DIAGNOSIS — Z8249 Family history of ischemic heart disease and other diseases of the circulatory system: Secondary | ICD-10-CM | POA: Diagnosis not present

## 2019-11-09 DIAGNOSIS — G8929 Other chronic pain: Secondary | ICD-10-CM | POA: Diagnosis present

## 2019-11-09 DIAGNOSIS — Z90721 Acquired absence of ovaries, unilateral: Secondary | ICD-10-CM | POA: Diagnosis not present

## 2019-11-09 DIAGNOSIS — Z9049 Acquired absence of other specified parts of digestive tract: Secondary | ICD-10-CM | POA: Diagnosis not present

## 2019-11-09 DIAGNOSIS — Z7983 Long term (current) use of bisphosphonates: Secondary | ICD-10-CM | POA: Diagnosis not present

## 2019-11-09 DIAGNOSIS — Z79891 Long term (current) use of opiate analgesic: Secondary | ICD-10-CM | POA: Diagnosis not present

## 2019-11-09 DIAGNOSIS — Z833 Family history of diabetes mellitus: Secondary | ICD-10-CM | POA: Diagnosis not present

## 2019-11-09 DIAGNOSIS — M5116 Intervertebral disc disorders with radiculopathy, lumbar region: Secondary | ICD-10-CM | POA: Diagnosis present

## 2019-11-09 DIAGNOSIS — M5137 Other intervertebral disc degeneration, lumbosacral region: Secondary | ICD-10-CM | POA: Diagnosis present

## 2019-11-09 DIAGNOSIS — Z87891 Personal history of nicotine dependence: Secondary | ICD-10-CM | POA: Diagnosis not present

## 2019-11-09 DIAGNOSIS — Z91040 Latex allergy status: Secondary | ICD-10-CM | POA: Diagnosis not present

## 2019-11-09 DIAGNOSIS — M961 Postlaminectomy syndrome, not elsewhere classified: Secondary | ICD-10-CM | POA: Diagnosis present

## 2019-11-09 DIAGNOSIS — M810 Age-related osteoporosis without current pathological fracture: Secondary | ICD-10-CM | POA: Diagnosis present

## 2019-11-09 DIAGNOSIS — M4317 Spondylolisthesis, lumbosacral region: Secondary | ICD-10-CM | POA: Diagnosis present

## 2019-11-09 DIAGNOSIS — Y838 Other surgical procedures as the cause of abnormal reaction of the patient, or of later complication, without mention of misadventure at the time of the procedure: Secondary | ICD-10-CM | POA: Diagnosis present

## 2019-11-09 DIAGNOSIS — M4727 Other spondylosis with radiculopathy, lumbosacral region: Secondary | ICD-10-CM | POA: Diagnosis present

## 2019-11-09 DIAGNOSIS — Z90710 Acquired absence of both cervix and uterus: Secondary | ICD-10-CM | POA: Diagnosis not present

## 2019-11-09 DIAGNOSIS — Z823 Family history of stroke: Secondary | ICD-10-CM | POA: Diagnosis not present

## 2019-11-09 DIAGNOSIS — Z882 Allergy status to sulfonamides status: Secondary | ICD-10-CM | POA: Diagnosis not present

## 2019-11-09 DIAGNOSIS — M4807 Spinal stenosis, lumbosacral region: Secondary | ICD-10-CM | POA: Diagnosis present

## 2019-11-09 DIAGNOSIS — Z91030 Bee allergy status: Secondary | ICD-10-CM | POA: Diagnosis not present

## 2019-11-09 DIAGNOSIS — M2578 Osteophyte, vertebrae: Secondary | ICD-10-CM | POA: Diagnosis present

## 2019-11-09 DIAGNOSIS — Z800 Family history of malignant neoplasm of digestive organs: Secondary | ICD-10-CM | POA: Diagnosis not present

## 2019-11-09 DIAGNOSIS — Z79899 Other long term (current) drug therapy: Secondary | ICD-10-CM | POA: Diagnosis not present

## 2019-11-09 NOTE — Evaluation (Signed)
Physical Therapy Evaluation and Discharge Patient Details Name: Shelia Snyder MRN: [id number] DOB: 16-Mar-1966 Today's Date: 11/09/2019   History of Present Illness  Pt is a 53 y/o female s/p ALIF L5-S1. PMH: osteoporosis, thrombocytopenia, IBS, anxiety, chronic low back pain, depression, L4-5 discectomy.   Clinical Impression  Patient evaluated by Physical Therapy with no further acute PT needs identified. All education has been completed and the patient has no further questions. Pt was able to demonstrate transfers and ambulation with gross supervision for safety and no AD - occasional railing use in hall. Pt will have family present upon d/c for assist if needed and overall I anticipate she will progress well functionally upon return home. Pt was educated on precautions, brace application/wearing schedule, appropriate activity progression, and car transfer. See below for any follow-up Physical Therapy or equipment needs. PT is signing off. Thank you for this referral.     Follow Up Recommendations No PT follow up    Equipment Recommendations  None recommended by PT    Recommendations for Other Services       Precautions / Restrictions Precautions Precautions: Back Precaution Booklet Issued: Yes (comment) Precaution Comments: able to recall 2/3 precautions - reviewed 3/3  Required Braces or Orthoses: Spinal Brace Spinal Brace: Lumbar corset;Applied in sitting position Restrictions Weight Bearing Restrictions: No      Mobility  Bed Mobility               General bed mobility comments: Pt was received exiting bathroom when PT arrived.   Transfers Overall transfer level: Needs assistance Equipment used: None Transfers: Sit to/from Stand Sit to Stand: Supervision         General transfer comment: Light supervision for safety. No assist required. VC's for wider BOS with stand>sit for improved posture.   Ambulation/Gait Ambulation/Gait assistance: Supervision Gait  Distance (Feet): 350 Feet Assistive device: None Gait Pattern/deviations: Step-through pattern;Decreased stride length;Trunk flexed Gait velocity: Decreased Gait velocity interpretation: <1.8 ft/sec, indicate of risk for recurrent falls General Gait Details: VC's for imrpoved posture. Pt occasionally holding to the railing in hall. Generally moving slow but very guarded without UE support. No overt LOB noted.   Stairs Stairs: Yes Stairs assistance: Min guard Stair Management: One rail Right;Step to pattern;Alternating pattern;Forwards Number of Stairs: 10 General stair comments: VC's for sequencing and general safety. Pt was able to negotiate a flight of stairs well without LOB or knee buckle. Pt initially with step-to pattern progressing to step-through pattern while ascending.   Wheelchair Mobility    Modified Rankin (Stroke Patients Only)       Balance Overall balance assessment: Mild deficits observed, not formally tested                                           Pertinent Vitals/Pain Pain Assessment: Faces Faces Pain Scale: Hurts a little bit Pain Location: incisional Pain Descriptors / Indicators: Discomfort;Grimacing;Operative site guarding Pain Intervention(s): Limited activity within patient's tolerance;Monitored during session;Repositioned    Home Living Family/patient expects to be discharged to:: Private residence Living Arrangements: Spouse/significant other;Children(son 55 y/o) Available Help at Discharge: Family;Available 24 hours/day Type of Home: House Home Access: Stairs to enter   [employer] of Steps: 1 Home Layout: Two level Home Equipment: Walker - 2 wheels;Cane - single point;Bedside commode;Shower seat - built in      Prior Function Level of Independence: Independent  Hand Dominance        Extremity/Trunk Assessment   Upper Extremity Assessment Upper Extremity Assessment: Defer to OT  evaluation    Lower Extremity Assessment Lower Extremity Assessment: Generalized weakness(Mild - Consistent with pre-op diagnosis.)    Cervical / Trunk Assessment Cervical / Trunk Assessment: Other exceptions Cervical / Trunk Exceptions: s/p back surgery  Communication   Communication: No difficulties  Cognition Arousal/Alertness: Awake/alert Behavior During Therapy: WFL for tasks assessed/performed Overall Cognitive Status: Within Functional Limits for tasks assessed                                        General Comments      Exercises     Assessment/Plan    PT Assessment Patent does not need any further PT services  PT Problem List Decreased activity tolerance;Decreased strength;Decreased balance;Decreased mobility;Decreased safety awareness;Decreased knowledge of precautions;Pain       PT Treatment Interventions      PT Goals (Current goals can be found in the Care Plan section)  Acute Rehab PT Goals Patient Stated Goal: less pain  PT Goal Formulation: All assessment and education complete, DC therapy    Frequency     Barriers to discharge        Co-evaluation               AM-PAC PT "6 Clicks" Mobility  Outcome Measure Help needed turning from your back to your side while in a flat bed without using bedrails?: None Help needed moving from lying on your back to sitting on the side of a flat bed without using bedrails?: None Help needed moving to and from a bed to a chair (including a wheelchair)?: None Help needed standing up from a chair using your arms (e.g., wheelchair or bedside chair)?: None Help needed to walk in hospital room?: A Little Help needed climbing 3-5 steps with a railing? : A Little 6 Click Score: 22    End of Session Equipment Utilized During Treatment: Gait belt Activity Tolerance: Patient tolerated treatment well Patient left: in chair;with call bell/phone within reach Nurse Communication: Mobility status PT  Visit Diagnosis: Unsteadiness on feet (R26.81);Pain    Time: 3888-2800 PT Time Calculation (min) (ACUTE ONLY): 21 min   Charges:   PT Evaluation $PT Eval Low Complexity: 1 Low          Conni Slipper, PT, DPT Acute Rehabilitation Services Pager: [phone] Office: [phone]   Marylynn Pearson 11/09/2019, 9:27 AM

## 2019-11-09 NOTE — Plan of Care (Signed)
Patient alert and oriented, mae's well, voiding adequate amount of urine, swallowing without difficulty,  c/o soreness at time of discharge and medication given prior to leaving. Patient discharged home with family. Script and discharged instructions given to patient. Patient and family stated understanding of instructions given. Patient has an appointment with Dr. Rolena Infante

## 2019-11-09 NOTE — Progress Notes (Signed)
    Subjective: Procedure(s) (LRB): ANTERIOR LUMBAR FUSION LUMBAR FIVE-SACRAL ONE (N/A) ABDOMINAL EXPOSURE (N/A) 1 Day Post-Op  Patient reports pain as 2 on 0-10 scale.  Reports decreased leg pain reports incisional back pain   Positive void Negative bowel movement Positive flatus Negative chest pain or shortness of breath  Objective: Vital signs in last 24 hours: Temp:  [97.4 F (36.3 C)-97.9 F (36.6 C)] 97.6 F (36.4 C) (12/24 0713) Pulse Rate:  [65-89] 89 (12/24 1320) Resp:  [16-19] 18 (12/24 1320) BP: (116-133)/(60-83) 127/73 (12/24 1320) SpO2:  [98 %-100 %] 98 % (12/24 1320)  Intake/Output from previous day: 12/23 0701 - 12/24 0700 In: 2100 [I.V.:2100] Out: 625 [Urine:475; Blood:150]  Labs: Recent Labs    11/08/19 1418  WBC 10.6*  RBC 4.39  HCT 40.8  PLT 208   Recent Labs    11/08/19 1418  CREATININE 0.70   No results for input(s): LABPT, INR in the last 72 hours.  Physical Exam: Neurologically intact ABD soft Intact pulses distally Incision: dressing C/D/I and no drainage Compartment soft Body mass index is 25.79 kg/m.   Assessment/Plan: Patient stable  xrays n/a Doppler study: negative for DVT Continue mobilization with physical therapy Continue care  Doing well Radicular leg pain resolved Pain control managed with oral meds. Ok for d/c to home  Melina Schools, MD [facility] [phone]

## 2019-11-09 NOTE — Progress Notes (Signed)
Patient ID: Shelia Snyder, female   DOB: 1965/12/30, 53 y.o.   MRN: [id number] Looks great. Sitting up in chair dressed.  Wants to go home Reports abdominal and back soreness but complete resolution of leg pain. Stable postop day 1.  Please call if we can assist

## 2019-11-09 NOTE — Progress Notes (Signed)
Occupational Therapy Treatment Patient Details Name: Shelia Snyder MRN: [id number] DOB: January 25, 1966 Today's Date: 11/09/2019    History of present illness Pt is a 53 y/o female s/p ALIF L5-S1. PMH: osteoporosis, thrombocytopenia, IBS, anxiety, chronic low back pain, depression, L4-5 discectomy.    OT comments  Pt making steady progress towards OT goals this session. Overall, pt requires supervision for functional mobility and transfers with no AD. Pt able to recall 3/3 precautions and was able to functionally demonstrate all precautions into ADL routine. Pt able to access feet via figure four and reports she was able to don pants this morning with no issues. Pt declined practicing walkin shower transfer but verbally discussed technique with pt verbalizing understanding. Pt reports husband and son can be with her 24/7 at home. DC plan remains appropriate,will continue to follow acutely per POC.    Follow Up Recommendations  No OT follow up;Supervision - Intermittent    Equipment Recommendations  None recommended by OT    Recommendations for Other Services      Precautions / Restrictions Precautions Precautions: Back Precaution Booklet Issued: Yes (comment) Precaution Comments: able to recall 3/3 precautions Required Braces or Orthoses: Spinal Brace Spinal Brace: Lumbar corset;Applied in sitting position Restrictions Weight Bearing Restrictions: No       Mobility Bed Mobility               General bed mobility comments: pt OOB upon arrival, verbally discussed log roll technique  Transfers Overall transfer level: Needs assistance Equipment used: None Transfers: Sit to/from Stand Sit to Stand: Supervision         General transfer comment: for safety    Balance Overall balance assessment: Mild deficits observed, not formally tested                                         ADL either performed or assessed with clinical judgement   ADL Overall ADL's  : Needs assistance/impaired       Grooming Details (indicate cue type and reason): pt leaving restroom upon arrival stating she had just finshed standing grooming tasks. reinforced compensatory strategies in relation to back precautions with pt verbalizing understanding         Upper Body Dressing : Set up;Sitting Upper Body Dressing Details (indicate cue type and reason): pt with brace donned upon arrival, able to verbalize correct wear schedule Lower Body Dressing: Supervision/safety;Sit to/from stand Lower Body Dressing Details (indicate cue type and reason): education on LB AE for bathing and dressing with stating she feels like she can manage without AE, able to figure four to access feet and reports she donned her own pants this AM Toilet Transfer: Ambulation;Supervision/safety Toilet Transfer Details (indicate cue type and reason): no AD, supervision for safety   Toileting - Clothing Manipulation Details (indicate cue type and reason): education on compesatory straegies for posterior pericare   Tub/Shower Transfer Details (indicate cue type and reason): declined practicing walk in shower transfer, but verbally discussed technique Functional mobility during ADLs: Supervision/safety General ADL Comments: session focus on functional mobility with no AD, LB AE education and compensatory strategies to incorporate into ADL routine     Vision   Vision Assessment?: No apparent visual deficits   Perception     Praxis      Cognition Arousal/Alertness: Awake/alert Behavior During Therapy: WFL for tasks assessed/performed Overall Cognitive Status: Within Functional Limits for  tasks assessed                                          Exercises     Shoulder Instructions       General Comments      Pertinent Vitals/ Pain       Pain Assessment: Faces Faces Pain Scale: Hurts a little bit Pain Location: incisional Pain Descriptors / Indicators:  Discomfort;Grimacing;Operative site guarding Pain Intervention(s): Monitored during session;Repositioned  Home Living                                          Prior Functioning/Environment              Frequency  Min 2X/week        Progress Toward Goals  OT Goals(current goals can now be found in the care plan section)  Progress towards OT goals: Progressing toward goals  Acute Rehab OT Goals Patient Stated Goal: less pain  OT Goal Formulation: With patient Time For Goal Achievement: 11/22/19 Potential to Achieve Goals: Good  Plan Discharge plan remains appropriate    Co-evaluation                 AM-PAC OT "6 Clicks" Daily Activity     Outcome Measure   Help from another person eating meals?: None Help from another person taking care of personal grooming?: A Little Help from another person toileting, which includes using toliet, bedpan, or urinal?: A Little Help from another person bathing (including washing, rinsing, drying)?: A Little Help from another person to put on and taking off regular upper body clothing?: A Little Help from another person to put on and taking off regular lower body clothing?: A Little 6 Click Score: 19    End of Session Equipment Utilized During Treatment: Back brace  OT Visit Diagnosis: Other abnormalities of gait and mobility (R26.89);Pain   Activity Tolerance Patient tolerated treatment well   Patient Left in chair;with call bell/phone within reach   Nurse Communication          Time: 9509-3267 OT Time Calculation (min): 10 min  Charges: OT General Charges $OT Visit: 1 Visit OT Treatments $Self Care/Home Management : 8-22 mins Audery Amel., COTA/L Acute Rehabilitation Services [phone]   Shelia Snyder 11/09/2019, 8:54 AM

## 2019-11-13 MED FILL — Sodium Chloride IV Soln 0.9%: INTRAVENOUS | Qty: 1000 | Status: AC

## 2019-11-13 MED FILL — Heparin Sodium (Porcine) Inj 1000 Unit/ML: INTRAMUSCULAR | Qty: 30 | Status: AC

## 2019-11-16 ENCOUNTER — Other Ambulatory Visit: Payer: Self-pay

## 2019-11-16 NOTE — Telephone Encounter (Signed)
Is this okay to refill? 

## 2019-11-18 ENCOUNTER — Other Ambulatory Visit: Payer: Self-pay | Admitting: Medical

## 2019-11-20 ENCOUNTER — Other Ambulatory Visit (HOSPITAL_COMMUNITY): Payer: Self-pay | Admitting: Orthopedic Surgery

## 2019-11-20 ENCOUNTER — Ambulatory Visit (HOSPITAL_COMMUNITY)
Admission: RE | Admit: 2019-11-20 | Discharge: 2019-11-20 | Disposition: A | Discharge status: Home / Self Care | Payer: BC Managed Care – PPO | Source: Ambulatory Visit | Attending: Orthopedic Surgery | Admitting: Orthopedic Surgery

## 2019-11-20 ENCOUNTER — Other Ambulatory Visit: Payer: Self-pay

## 2019-11-20 ENCOUNTER — Other Ambulatory Visit: Payer: Self-pay | Admitting: Orthopedic Surgery

## 2019-11-20 DIAGNOSIS — M79605 Pain in left leg: Secondary | ICD-10-CM

## 2019-11-20 DIAGNOSIS — I8001 Phlebitis and thrombophlebitis of superficial vessels of right lower extremity: Secondary | ICD-10-CM | POA: Diagnosis not present

## 2019-11-20 DIAGNOSIS — M79604 Pain in right leg: Secondary | ICD-10-CM

## 2019-11-20 NOTE — Discharge Summary (Signed)
Patient ID: Shelia Snyder MRN: [id number] DOB/AGE: Mar 29, 1966 54 y.o.  Admit date: 11/08/2019 Discharge date: 11/20/2019  Admission Diagnoses:  Active Problems:   S/P lumbar fusion   Discharge Diagnoses:  Active Problems:   S/P lumbar fusion  status post Procedure(s): ANTERIOR LUMBAR FUSION LUMBAR FIVE-SACRAL ONE ABDOMINAL EXPOSURE  Past Medical History:  Diagnosis Date  . Anxiety   . Chronic low back pain   . Degeneration of lumbar intervertebral disc   . Degenerative spondylolisthesis   . Depression   . Diarrhea   . Elevated liver enzymes   . IBS (irritable bowel syndrome) 09/04/2011  . Left leg pain    related to L4-L5 ruptured disc  . Osteoporosis     Surgeries: Procedure(s): ANTERIOR LUMBAR FUSION LUMBAR FIVE-SACRAL ONE ABDOMINAL EXPOSURE on 11/08/2019   Consultants:   Discharged Condition: Improved  Hospital Course: Shelia Snyder is an 54 y.o. female who was admitted 11/08/2019 for operative treatment of spondylolisthesis at L5-S1 with degenerative lumbar disc disease. Patient failed conservative treatments (please see the history and physical for the specifics) and had severe unremitting pain that affects sleep, daily activities and work/hobbies. After pre-op clearance, the patient was taken to the operating room on 11/08/2019 and underwent  Procedure(s): ANTERIOR LUMBAR FUSION LUMBAR FIVE-SACRAL ONE ABDOMINAL EXPOSURE.    Patient was given perioperative antibiotics:  Anti-infectives (From admission, onward)   Start     Dose/Rate Route Frequency Ordered Stop   11/08/19 1600  ceFAZolin (ANCEF) IVPB 1 g/50 mL premix     1 g 100 mL/hr over 30 Minutes Intravenous Every 8 hours 11/08/19 1240 11/08/19 2307   11/08/19 0600  vancomycin (VANCOCIN) IVPB 1000 mg/200 mL premix  Status:  Discontinued     1,000 mg 200 mL/hr over 60 Minutes Intravenous 120 min pre-op 11/08/19 0554 11/08/19 0820   11/08/19 0553  ceFAZolin (ANCEF) IVPB 2g/100 mL premix     2 g 200 mL/hr  over 30 Minutes Intravenous 30 min pre-op 11/08/19 0553 11/08/19 0800       Patient was given sequential compression devices and early ambulation to prevent DVT.   Patient benefited maximally from hospital stay and there were no complications. At the time of discharge, the patient was urinating/moving their bowels without difficulty, tolerating a regular diet, pain is controlled with oral pain medications and they have been cleared by PT/OT.   Recent vital signs: No data found.   Recent laboratory studies: No results for input(s): WBC, HGB, HCT, PLT, NA, K, CL, CO2, BUN, CREATININE, GLUCOSE, INR, CALCIUM in the last 72 hours.  Invalid input(s): PT, 2   Discharge Medications:   Allergies as of 11/09/2019      Reactions   Bee Venom Anaphylaxis   Sulfa Antibiotics Anaphylaxis   Latex Rash      Medication List    STOP taking these medications   alendronate 70 MG tablet Commonly known as: Fosamax   chlorhexidine 0.12 % solution Commonly known as: PERIDEX   HYDROcodone-acetaminophen 10-325 MG tablet Commonly known as: NORCO   methocarbamol 500 MG tablet Commonly known as: Robaxin   ondansetron 4 MG disintegrating tablet Commonly known as: ZOFRAN-ODT   Vitamin D (Ergocalciferol) 1.25 MG (50000 UT) Caps capsule Commonly known as: DRISDOL     TAKE these medications   ALPRAZolam 1 MG tablet Commonly known as: XANAX TAKE 1 TABLET(1 MG) BY MOUTH AT BEDTIME AS NEEDED FOR ANXIETY What changed: See the new instructions.   DULoxetine 30 MG capsule Commonly known  as: CYMBALTA TAKE 1 CAPSULE BY MOUTH TWICE DAILY   enoxaparin 40 MG/0.4ML injection Commonly known as: LOVENOX Inject 0.4 mLs (40 mg total) into the skin daily. 10 day supply 1 injection per day   famotidine 40 MG tablet Commonly known as: PEPCID Take 1 tablet (40 mg total) by mouth daily.   gabapentin 600 MG tablet Commonly known as: NEURONTIN Take 1 tablet (600 mg total) by mouth 3 (three) times daily.     ondansetron 4 MG tablet Commonly known as: Zofran Take 1 tablet (4 mg total) by mouth every 8 (eight) hours as needed for nausea or vomiting.   traZODone 50 MG tablet Commonly known as: DESYREL Take 1 tablet (50 mg total) by mouth at bedtime.   Viberzi 75 MG Tabs Generic drug: Eluxadoline Take 1 tablet by mouth 2 (two) times daily. What changed:   when to take this  reasons to take this     ASK your doctor about these medications   methocarbamol 500 MG tablet Commonly known as: Robaxin Take 1 tablet (500 mg total) by mouth every 8 (eight) hours as needed for up to 5 days for muscle spasms. Ask about: Should I take this medication?   oxyCODONE-acetaminophen 10-325 MG tablet Commonly known as: Percocet Take 1 tablet by mouth every 6 (six) hours as needed for up to 5 days for pain. Ask about: Should I take this medication?       Diagnostic Studies: DG Chest 2 View  Result Date: 11/06/2019 CLINICAL DATA:  Lumbar surgery. EXAM: CHEST - 2 VIEW COMPARISON:  Chest x-ray 09/22/2018. FINDINGS: Mediastinum and hilar structures normal. Lungs are clear. No pleural effusion or pneumothorax. Surgical clips right upper quadrant. Degenerative change and scoliosis thoracic spine. No acute bony abnormality identified. IMPRESSION: No acute cardiopulmonary disease. Electronically Signed   By: Maisie Fus  Register   On: 11/06/2019 15:37   DG Lumbar Spine 2-3 Views  Result Date: 11/08/2019 CLINICAL DATA:  Lumbar fusion L5-S1 EXAM: DG C-ARM 1-60 MIN; LUMBAR SPINE - 2-3 VIEW COMPARISON:  06/15/2019 FINDINGS: AP and lateral C-arm images were obtained of the lumbosacral junction. A LIF fusion device at L5-S1 in good position. No acute complication. No retained instrument. IMPRESSION: Satisfactory ALIF fusion device L5-S1. Electronically Signed   By: Marlan Palau M.D.   On: 11/08/2019 10:37   DG C-Arm 1-60 Min  Result Date: 11/08/2019 CLINICAL DATA:  Lumbar fusion L5-S1 EXAM: DG C-ARM 1-60 MIN;  LUMBAR SPINE - 2-3 VIEW COMPARISON:  06/15/2019 FINDINGS: AP and lateral C-arm images were obtained of the lumbosacral junction. A LIF fusion device at L5-S1 in good position. No acute complication. No retained instrument. IMPRESSION: Satisfactory ALIF fusion device L5-S1. Electronically Signed   By: Marlan Palau M.D.   On: 11/08/2019 10:37   VAS [place] LOWER EXTREMITY VENOUS (DVT)  Result Date: 11/08/2019  Lower Venous Study Indications: Post-op.  Performing Technologist: Jeb Levering RDMS, RVT  Examination Guidelines: A complete evaluation includes B-mode imaging, spectral Doppler, color Doppler, and power Doppler as needed of all accessible portions of each vessel. Bilateral testing is considered an integral part of a complete examination. Limited examinations for reoccurring indications may be performed as noted.  +---------+---------------+---------+-----------+----------+--------------+ RIGHT    CompressibilityPhasicitySpontaneityPropertiesThrombus Aging +---------+---------------+---------+-----------+----------+--------------+ CFV      Full           Yes      Yes                                 +---------+---------------+---------+-----------+----------+--------------+  SFJ      Full                                                        +---------+---------------+---------+-----------+----------+--------------+ FV Prox  Full                                                        +---------+---------------+---------+-----------+----------+--------------+ FV Mid   Full                                                        +---------+---------------+---------+-----------+----------+--------------+ FV DistalFull                                                        +---------+---------------+---------+-----------+----------+--------------+ PFV      Full                                                         +---------+---------------+---------+-----------+----------+--------------+ POP      Full           Yes      Yes                                 +---------+---------------+---------+-----------+----------+--------------+ PTV      Full                                                        +---------+---------------+---------+-----------+----------+--------------+ PERO     Full                                                        +---------+---------------+---------+-----------+----------+--------------+   +---------+---------------+---------+-----------+----------+--------------+ LEFT     CompressibilityPhasicitySpontaneityPropertiesThrombus Aging +---------+---------------+---------+-----------+----------+--------------+ CFV      Full           Yes      Yes                                 +---------+---------------+---------+-----------+----------+--------------+ SFJ      Full                                                        +---------+---------------+---------+-----------+----------+--------------+  FV Prox  Full                                                        +---------+---------------+---------+-----------+----------+--------------+ FV Mid   Full                                                        +---------+---------------+---------+-----------+----------+--------------+ FV DistalFull                                                        +---------+---------------+---------+-----------+----------+--------------+ PFV      Full                                                        +---------+---------------+---------+-----------+----------+--------------+ POP      Full           Yes      Yes                                 +---------+---------------+---------+-----------+----------+--------------+ PTV      Full                                                         +---------+---------------+---------+-----------+----------+--------------+ PERO     Full                                                        +---------+---------------+---------+-----------+----------+--------------+     Summary: Right: There is no evidence of deep vein thrombosis in the lower extremity. No cystic structure found in the popliteal fossa. Left: There is no evidence of deep vein thrombosis in the lower extremity. No cystic structure found in the popliteal fossa.  *See table(s) above for measurements and observations. Electronically signed by Curt Jews MD on 11/08/2019 at 4:01:12 PM.    Final    DG OR LOCAL ABDOMEN  Result Date: 11/08/2019 CLINICAL DATA:  Lumbar fusion.  Rule out instrument or sponge EXAM: OR LOCAL ABDOMEN COMPARISON:  11/08/2019 FINDINGS: ALIF hardware at L5-S1 in good position. Gas in the extraperitoneal soft tissues left pelvis from surgery. No retained instrument sponge or needle.  Normal bowel gas pattern. IMPRESSION: ALIF L5-S1.  No retained instrument These results were called by telephone at the time of interpretation on 11/08/2019 at 10:24 am to provider Sherlynn Stalls , who verbally acknowledged these results. Electronically Signed   By: Franchot Gallo M.D.   On: 11/08/2019 10:25  Discharge Instructions    Incentive spirometry RT   Complete by: As directed       Follow-up Information    Venita Lick, MD. Schedule an appointment as soon as possible for a visit in 2 weeks.   Specialty: Orthopedic Surgery Why: If symptoms worsen, For suture removal, For wound re-check Contact information: [address] [place] [address] [phone]           Discharge Plan:  discharge to home  Disposition: stable    Signed: Leonette Monarch Esmeralda Malay for [facility] PA-C [facility] [phone] 11/20/2019, 8:18 AM

## 2019-12-12 DIAGNOSIS — M545 Low back pain: Secondary | ICD-10-CM | POA: Diagnosis not present

## 2019-12-13 DIAGNOSIS — M5136 Other intervertebral disc degeneration, lumbar region: Secondary | ICD-10-CM | POA: Diagnosis not present

## 2019-12-13 DIAGNOSIS — M961 Postlaminectomy syndrome, not elsewhere classified: Secondary | ICD-10-CM | POA: Diagnosis not present

## 2019-12-13 DIAGNOSIS — Z79899 Other long term (current) drug therapy: Secondary | ICD-10-CM | POA: Diagnosis not present

## 2019-12-13 DIAGNOSIS — M4310 Spondylolisthesis, site unspecified: Secondary | ICD-10-CM | POA: Diagnosis not present

## 2019-12-13 DIAGNOSIS — G894 Chronic pain syndrome: Secondary | ICD-10-CM | POA: Diagnosis not present

## 2019-12-19 DIAGNOSIS — Z4889 Encounter for other specified surgical aftercare: Secondary | ICD-10-CM | POA: Diagnosis not present

## 2020-01-01 ENCOUNTER — Other Ambulatory Visit: Payer: Self-pay | Admitting: Medical

## 2020-01-03 ENCOUNTER — Ambulatory Visit (HOSPITAL_COMMUNITY): Payer: BC Managed Care – PPO | Attending: Orthopedic Surgery | Admitting: Physical Therapy

## 2020-01-03 ENCOUNTER — Encounter (HOSPITAL_COMMUNITY): Payer: Self-pay

## 2020-01-12 DIAGNOSIS — Z79899 Other long term (current) drug therapy: Secondary | ICD-10-CM | POA: Diagnosis not present

## 2020-01-12 DIAGNOSIS — G894 Chronic pain syndrome: Secondary | ICD-10-CM | POA: Diagnosis not present

## 2020-01-13 ENCOUNTER — Other Ambulatory Visit: Payer: Self-pay | Admitting: Medical

## 2020-01-15 NOTE — Telephone Encounter (Signed)
Mailbox is full.

## 2020-01-17 ENCOUNTER — Other Ambulatory Visit: Payer: Self-pay | Admitting: Medical

## 2020-01-29 ENCOUNTER — Telehealth: Payer: Self-pay

## 2020-01-29 NOTE — Telephone Encounter (Signed)
I submitted a PA for the pts. Gabapentin several times and every time they could not find the pt. Called the pt. Today and she said she has not had insurance for a little while but should get insurance again by the end of March she will call back then.

## 2020-02-28 ENCOUNTER — Telehealth: Payer: Self-pay | Admitting: Medical

## 2020-02-28 ENCOUNTER — Other Ambulatory Visit: Payer: Self-pay | Admitting: Medical

## 2020-02-28 MED ORDER — ALPRAZOLAM 1 MG PO TABS: 1.0000 mg | ORAL_TABLET | Freq: Every evening | ORAL | 1 refills | Status: DC | PRN

## 2020-02-28 NOTE — Telephone Encounter (Signed)
done

## 2020-02-28 NOTE — Telephone Encounter (Signed)
Pt called and left a message. She would like a refill on Xanax. She states she knows it it time for her to come in but she will not have ins until June 1.

## 2020-04-06 DIAGNOSIS — M25531 Pain in right wrist: Secondary | ICD-10-CM | POA: Diagnosis not present

## 2020-04-06 DIAGNOSIS — S6991XA Unspecified injury of right wrist, hand and finger(s), initial encounter: Secondary | ICD-10-CM | POA: Diagnosis not present

## 2020-04-06 DIAGNOSIS — S79912A Unspecified injury of left hip, initial encounter: Secondary | ICD-10-CM | POA: Diagnosis not present

## 2020-04-06 DIAGNOSIS — M25562 Pain in left knee: Secondary | ICD-10-CM | POA: Diagnosis not present

## 2020-04-06 DIAGNOSIS — S8992XA Unspecified injury of left lower leg, initial encounter: Secondary | ICD-10-CM | POA: Diagnosis not present

## 2020-04-11 ENCOUNTER — Other Ambulatory Visit: Payer: Self-pay | Admitting: Medical

## 2020-04-12 DIAGNOSIS — M79642 Pain in left hand: Secondary | ICD-10-CM | POA: Diagnosis not present

## 2020-04-12 DIAGNOSIS — S6992XA Unspecified injury of left wrist, hand and finger(s), initial encounter: Secondary | ICD-10-CM | POA: Diagnosis not present

## 2020-04-18 ENCOUNTER — Other Ambulatory Visit: Payer: Self-pay | Admitting: Medical

## 2020-04-22 ENCOUNTER — Other Ambulatory Visit: Payer: Self-pay | Admitting: Medical

## 2020-04-24 ENCOUNTER — Other Ambulatory Visit: Payer: Self-pay | Admitting: Medical

## 2020-04-24 ENCOUNTER — Telehealth: Payer: Self-pay | Admitting: Medical

## 2020-04-24 MED ORDER — ALPRAZOLAM 1 MG PO TABS: 1.0000 mg | ORAL_TABLET | Freq: Every evening | ORAL | 0 refills | Status: DC | PRN

## 2020-04-24 NOTE — Telephone Encounter (Signed)
Time for med check.  Please schedule

## 2020-04-24 NOTE — Telephone Encounter (Signed)
Pt needs refill on Xanax sent to the [facility] in [place]

## 2020-04-25 ENCOUNTER — Ambulatory Visit: Payer: BC Managed Care – PPO | Admitting: Family Medicine

## 2020-04-25 ENCOUNTER — Other Ambulatory Visit: Payer: Self-pay

## 2020-04-25 ENCOUNTER — Encounter: Payer: Self-pay | Admitting: Medical

## 2020-04-25 ENCOUNTER — Encounter: Payer: Self-pay | Admitting: Family Medicine

## 2020-04-25 VITALS — BP 138/78 | HR 92 | Temp 97.7°F | Ht 65.0 in | Wt 156.0 lb

## 2020-04-25 DIAGNOSIS — R319 Hematuria, unspecified: Secondary | ICD-10-CM | POA: Diagnosis not present

## 2020-04-25 LAB — POCT URINALYSIS DIP (PROADVANTAGE DEVICE)
Bilirubin, UA: NEGATIVE
Blood, UA: NEGATIVE
Glucose, UA: NEGATIVE mg/dL
Ketones, POC UA: NEGATIVE mg/dL
Leukocytes, UA: NEGATIVE
Nitrite, UA: NEGATIVE
Protein Ur, POC: NEGATIVE mg/dL
Specific Gravity, Urine: 1.015
Urobilinogen, Ur: NEGATIVE
pH, UA: 6 (ref 5.0–8.0)

## 2020-04-25 NOTE — Progress Notes (Signed)
Chief Complaint  Patient presents with  . Hematuria    has blood in urine, mostly in the mornings. Can see blood on the toilet paper. Did have a fall 5/22 hit her left side (hip area) and wants to make sure she didn't injure anything. She doesn't experience any burning or anything.    She fell 5/22 fractured L wrist, injured L knee. She noticed pink on the toilet paper the following morning, after voiding. Monday she noticed bright red on the TP.  Never had urgency/frequency, odor Started drinking cranberry juice. It was pink on TP today.  Only sees it when she first wakes up.  Denies bleeding or spotting in underwear. Denies vaginal discharge. She is s/p hysterectomy. Denies rectal bleeidng. Denies irritation or skin abnormality.  She has a h/o kidney stones, hasn't seen one pass, but this is her main concern, as she had blood noted in urine similarly with kidney stones in past.  She denies any recent abdominal or flank pain.   PMH, PSH, SH reviewed  Outpatient Encounter Medications as of 04/25/2020  Medication Sig  . alendronate (FOSAMAX) 70 MG tablet Take 70 mg by mouth once a week.  . ALPRAZolam (XANAX) 1 MG tablet Take 1 tablet (1 mg total) by mouth at bedtime as needed for anxiety.  . DULoxetine (CYMBALTA) 30 MG capsule TAKE 1 CAPSULE BY MOUTH TWICE DAILY (Patient taking differently: Take 30 mg by mouth 2 (two) times daily. )  . Eluxadoline (VIBERZI) 75 MG TABS Take 1 tablet by mouth 2 (two) times daily. (Patient taking differently: Take 1 tablet by mouth 2 (two) times daily as needed. )  . famotidine (PEPCID) 40 MG tablet TAKE 1 TABLET(40 MG) BY MOUTH DAILY  . gabapentin (NEURONTIN) 600 MG tablet TAKE 1 TABLET(600 MG) BY MOUTH THREE TIMES DAILY  . ibuprofen (ADVIL) 800 MG tablet Take 800 mg by mouth every 8 (eight) hours as needed.  . traZODone (DESYREL) 50 MG tablet TAKE 1 TABLET(50 MG) BY MOUTH AT BEDTIME  . [DISCONTINUED] enoxaparin (LOVENOX) 40 MG/0.4ML injection Inject 0.4  mLs (40 mg total) into the skin daily. 10 day supply 1 injection per day  . [DISCONTINUED] ondansetron (ZOFRAN) 4 MG tablet Take 1 tablet (4 mg total) by mouth every 8 (eight) hours as needed for nausea or vomiting.   No facility-administered encounter medications on file as of 04/25/2020.   Allergies  Allergen Reactions  . Bee Venom Anaphylaxis  . Sulfa Antibiotics Anaphylaxis  . Latex Rash   ROS: no fever, chills, URI symptoms. No nausea, vomiting, diarrhea.  No vaginal discharge or bleeding. No urinary urgency, frequency, odor or incontinence.  +blood noted on toilet paper after voiding.  Denies any constipation, diarrhea, blood in the stool. Denies any other bleeding.  Denies abdominal or flank pain. +L knee pain and L wrist pain (recent fracture)   PHYSICAL EXAM:  BP 138/78   Pulse 92   Temp 97.7 F (36.5 C) (Temporal)   Ht 5\' 5"  (1.651 m)   Wt 156 lb (70.8 kg)   BMI 25.96 kg/m   Pleasant, well-appearing female in no distress.  Brace on L forearm, ACE wrap at L knee. HEENT: conjunctiva and sclera are clear, EOMI. Wearing mask Heart: regular rate and rhythm Lungs: clear bilaterally Back: no spinal or CVA tenderness Abdomen: soft, nontender, no mass External genitalia:  External vaginal area is normal, no erythema, rash, lesions or irritation. No abnormal vaginal discharge.  Bimanual exam is normal--uterus surgically absent. nontender over bladder.  No masses or tenderness.   Hard stool palpable posteriorly Rectal area:  There is some skin irritation anterior to rectum. No inflamed hemorrhoids or obvious fissure  Urine dip: normal   ASSESSMENT/PLAN:  Hematuria, unspecified type - blood on toilet paper.  Urine dip normal.  Suspect related to skin irritation, cannot also r/o fissure. Reassure no renal stone, hematuria - Plan: POCT Urinalysis DIP (Proadvantage Device)  Has some hard stool, suspect constipation, therefore discussed the possibility of bleeding related to  fissure vs hemorrhoid.  Encouraged adequate fluid intake, high fiber diet. Recommended using barrier cream for irritated area peri-rectally.  Reassured no e/o kidney stone or UTI.  F/u if symptoms persist/worsen.

## 2020-04-25 NOTE — Patient Instructions (Signed)
Your urine was normal. Your pelvic exam was normal. There was some irritation of the skin in front of the rectum, and hard stool was present.  It is possible that either hemorrhoid or fissure, or the skin irritation seen can be contributing to the pink/blood on the toilet paper. You can try a barrier such as Desitin until the skin irritation heals. Be sure to drink plenty of water and eat a high fiber diet.

## 2020-05-13 DIAGNOSIS — Z79899 Other long term (current) drug therapy: Secondary | ICD-10-CM | POA: Diagnosis not present

## 2020-05-13 DIAGNOSIS — M961 Postlaminectomy syndrome, not elsewhere classified: Secondary | ICD-10-CM | POA: Diagnosis not present

## 2020-05-13 DIAGNOSIS — M4310 Spondylolisthesis, site unspecified: Secondary | ICD-10-CM | POA: Diagnosis not present

## 2020-05-13 DIAGNOSIS — M5136 Other intervertebral disc degeneration, lumbar region: Secondary | ICD-10-CM | POA: Diagnosis not present

## 2020-05-13 DIAGNOSIS — G894 Chronic pain syndrome: Secondary | ICD-10-CM | POA: Diagnosis not present

## 2020-05-17 DIAGNOSIS — Z79899 Other long term (current) drug therapy: Secondary | ICD-10-CM | POA: Diagnosis not present

## 2020-05-17 DIAGNOSIS — M5136 Other intervertebral disc degeneration, lumbar region: Secondary | ICD-10-CM | POA: Diagnosis not present

## 2020-05-17 DIAGNOSIS — M961 Postlaminectomy syndrome, not elsewhere classified: Secondary | ICD-10-CM | POA: Diagnosis not present

## 2020-05-17 DIAGNOSIS — M4310 Spondylolisthesis, site unspecified: Secondary | ICD-10-CM | POA: Diagnosis not present

## 2020-05-17 DIAGNOSIS — G894 Chronic pain syndrome: Secondary | ICD-10-CM | POA: Diagnosis not present

## 2020-05-20 ENCOUNTER — Other Ambulatory Visit: Payer: Self-pay | Admitting: Medical

## 2020-05-26 ENCOUNTER — Other Ambulatory Visit: Payer: Self-pay | Admitting: Medical

## 2020-05-31 ENCOUNTER — Encounter: Payer: Self-pay | Admitting: Medical

## 2020-05-31 ENCOUNTER — Other Ambulatory Visit: Payer: Self-pay

## 2020-05-31 ENCOUNTER — Ambulatory Visit: Payer: BC Managed Care – PPO | Admitting: Medical

## 2020-05-31 VITALS — BP 166/92 | HR 85 | Ht 65.0 in | Wt 149.4 lb

## 2020-05-31 DIAGNOSIS — F419 Anxiety disorder, unspecified: Secondary | ICD-10-CM | POA: Diagnosis not present

## 2020-05-31 DIAGNOSIS — G8929 Other chronic pain: Secondary | ICD-10-CM | POA: Diagnosis not present

## 2020-05-31 DIAGNOSIS — K589 Irritable bowel syndrome without diarrhea: Secondary | ICD-10-CM

## 2020-05-31 DIAGNOSIS — G4700 Insomnia, unspecified: Secondary | ICD-10-CM | POA: Diagnosis not present

## 2020-05-31 DIAGNOSIS — E2839 Other primary ovarian failure: Secondary | ICD-10-CM

## 2020-05-31 DIAGNOSIS — K529 Noninfective gastroenteritis and colitis, unspecified: Secondary | ICD-10-CM | POA: Diagnosis not present

## 2020-05-31 DIAGNOSIS — M545 Low back pain, unspecified: Secondary | ICD-10-CM

## 2020-05-31 DIAGNOSIS — Z780 Asymptomatic menopausal state: Secondary | ICD-10-CM

## 2020-05-31 DIAGNOSIS — Z8249 Family history of ischemic heart disease and other diseases of the circulatory system: Secondary | ICD-10-CM

## 2020-05-31 DIAGNOSIS — R030 Elevated blood-pressure reading, without diagnosis of hypertension: Secondary | ICD-10-CM

## 2020-05-31 DIAGNOSIS — Z79899 Other long term (current) drug therapy: Secondary | ICD-10-CM | POA: Diagnosis not present

## 2020-05-31 DIAGNOSIS — E559 Vitamin D deficiency, unspecified: Secondary | ICD-10-CM

## 2020-05-31 DIAGNOSIS — Z7189 Other specified counseling: Secondary | ICD-10-CM

## 2020-05-31 DIAGNOSIS — Z981 Arthrodesis status: Secondary | ICD-10-CM

## 2020-05-31 DIAGNOSIS — M810 Age-related osteoporosis without current pathological fracture: Secondary | ICD-10-CM | POA: Diagnosis not present

## 2020-05-31 DIAGNOSIS — K219 Gastro-esophageal reflux disease without esophagitis: Secondary | ICD-10-CM

## 2020-05-31 DIAGNOSIS — T402X5A Adverse effect of other opioids, initial encounter: Secondary | ICD-10-CM | POA: Insufficient documentation

## 2020-05-31 DIAGNOSIS — K5903 Drug induced constipation: Secondary | ICD-10-CM

## 2020-05-31 DIAGNOSIS — Z7185 Encounter for immunization safety counseling: Secondary | ICD-10-CM

## 2020-05-31 MED ORDER — DULOXETINE HCL 30 MG PO CPEP: 30.0000 mg | ORAL_CAPSULE | Freq: Two times a day (BID) | ORAL | 1 refills | Status: DC

## 2020-05-31 MED ORDER — ALPRAZOLAM 1 MG PO TABS: 1.0000 mg | ORAL_TABLET | Freq: Every evening | ORAL | 0 refills | Status: DC | PRN

## 2020-05-31 MED ORDER — TRAZODONE HCL 50 MG PO TABS: ORAL_TABLET | ORAL | 3 refills | Status: DC

## 2020-05-31 MED ORDER — LINACLOTIDE 72 MCG PO CAPS: 72.0000 ug | ORAL_CAPSULE | Freq: Every day | ORAL | 0 refills | Status: DC

## 2020-05-31 MED ORDER — VITAMIN D (ERGOCALCIFEROL) 1.25 MG (50000 UNIT) PO CAPS: 50000.0000 [IU] | ORAL_CAPSULE | ORAL | 3 refills | Status: AC

## 2020-05-31 MED ORDER — ALENDRONATE SODIUM 70 MG PO TABS: 70.0000 mg | ORAL_TABLET | ORAL | 3 refills | Status: DC

## 2020-05-31 MED ORDER — FAMOTIDINE 40 MG PO TABS: ORAL_TABLET | ORAL | 3 refills | Status: DC

## 2020-05-31 NOTE — Progress Notes (Signed)
Subjective:  Shelia Snyder is a 54 y.o. female who presents for Chief Complaint  Patient presents with   Medication Management    need refill on everything except pain meds      Shelia Snyder is a 54yo white female with a history of chronic back pain, vitamin D deficiency, osteoporosis, IBS, thrombocytopenia, GERD, insomnia, anxiety, postmenopausal here for med check.   In the interim history since last visit here, we had referred to orthopedics given her history of chronic back pain and reliance on somewhat regular narcotic use for pain control.   She ended up having ALIF anterior lumbar interbody fusion, L5-S1 on November 08, 2019.  As of her February 2021 follow-up with Dr. Venita Lick, she was having improvements in pain and sleep.  Her MRI in January and follow-up after surgery showed improved alignment and foraminal decompression.  She was referred to physical therapy in the coming weeks.    She has also been back to orthopedics more recently.  She fell at work in May 2021 injuring her left knee and left thumb and wrist.  She has been seeing orthopedics for this and follow-up.  She ended up seeing urgent care and orthopedics and was found to have a closed fracture of the distal end of left radius.   Awaiting MRI knee 7/29.  Her pain control is being handled by [facility].  She has consistently been using morphine and oxycodone of late.  Osteoporosis-she is compliant with Fosamax weekly.  This was started September 2020 after her last bone density test.  Anxiety- she is taking Cymbalta 30 mg BID.  Uses Xanax most evenings regularly.  (Reviewing PDMP controlled substance information, she picked up her alprazolam # 30 on March 1, April 14, May 17, June 17.  So she is consistently taking this daily).  Last counseling 5 years ago.  She notes a lot of anxiety right now dealing with Worker's Comp. and her fall at work and injury.  She has had a hard time with all the appointments followed back-and-forth on  the phone dealing with insurance.  She feels like she needs to take some time off work to get her nerves under control and get things together.  No SI, HI  Chronic back pain-she is compliant with gabapentin 600 mg 1 tablet 3 times a day, Cymbalta BID.  Uses ibuprofen as needed  IBS-was doing fine on Viberzi, but since being on opiates through ortho, been having constipation.  Insomnia-uses trazodone nightly for sleep, along with Xanax in the evening  No other aggravating or relieving factors.    No other c/o.  Past Medical History:  Diagnosis Date   Anxiety    Chronic low back pain    Degeneration of lumbar intervertebral disc    Degenerative spondylolisthesis    Depression    Diarrhea    Elevated liver enzymes    IBS (irritable bowel syndrome) 09/04/2011   Left leg pain    related to L4-L5 ruptured disc   Osteoporosis    Family History  Problem Relation Age of Onset   Heart attack Mother 38       MI   Aneurysm Mother        abdominal, smoker   Hypertension Mother    Heart disease Mother 57   COPD Father    Diabetes Maternal Uncle    Cancer Maternal Grandmother        colon   Cancer Paternal Grandmother  breast   Stroke Neg Hx     The following portions of the patient's history were reviewed and updated as appropriate: allergies, current medications, past family history, past medical history, past social history, past surgical history and problem list.  ROS Otherwise as in subjective above   Objective: BP (!) 166/92    Pulse 85    Ht 5\' 5"  (1.651 m)    Wt 149 lb 6.4 oz (67.8 kg)    SpO2 98%    BMI 24.86 kg/m   BP Readings from Last 3 Encounters:  05/31/20 (!) 166/92  04/25/20 138/78  11/09/19 127/73   General appearance: alert, no distress, well developed, well nourished Neck: supple, no lymphadenopathy, no thyromegaly, no masses Heart: RRR, normal S1, S2, no murmurs Lungs: CTA bilaterally, no wheezes, rhonchi, or rales Abdomen:  +bs, soft, non tender, non distended, no masses, no hepatomegaly, no splenomegaly Pulses: 2+ radial pulses, 2+ pedal pulses, normal cap refill Ext: no edema There is a compression sleeve on her left wrist Psych: Pleasant, almost tearful at 1 point, but answers questions appropriately   Assessment: Encounter Diagnoses  Name Primary?   Chronic low back pain without sciatica, unspecified back pain laterality Yes   Anxiety    Insomnia, unspecified type    Osteoporosis, unspecified osteoporosis type, unspecified pathological fracture presence    Chronic diarrhea    Irritable bowel syndrome, unspecified type    Gastroesophageal reflux disease, unspecified whether esophagitis present    High risk medication use    Vitamin D deficiency    Estrogen deficiency    Post-menopausal    S/P lumbar fusion    Vaccine counseling    Family history of heart disease    Elevated blood-pressure reading without diagnosis of hypertension    Constipation due to opioid therapy      Plan: Chronic low back pain-currently seeing [facility], status post lumbar fusion in December 2020, long history of chronic back pain including chronic narcotic use.   She continues on Cymbalta for both anxiety and pain, gabapentin  Recent distal radius of arm fracture-managed by orthopedics, on significant narcotic pain medicine currently through them  Anxiety-she continues on Cymbalta, Xanax regularly.  Discussed the role of counseling and encouraged her to be doing this.   I reviewed the PMP aware controlled substance log.  I advised that I cannot take her out of work prolonged.  However I wrote her a work note out for 2 days advise she follow-up with a counselor to deal with stress.  Insomnia-continue trazodone, work on good sleep hygiene.  IBS/chronic diarrhea-was doing fine on Viberzi.  She stopped this temporarily as she is now having constipation due to opioid therapy  Opioid-induced  constipation-begin trial of Linzess, increase water intake.  GERD-she continues on famotidine regularly, avoid GERD triggers.  Discussed NSAIDs as a trigger as well since she has been taking some  Osteoporosis-continue Fosamax started September 2020 after the bone density at that time.  Continue vitamin D supplementation.  Counseled on exercise with aerobic and weightbearing exercise.  With her surgery this past year and recent injury, this is somewhat limited  Vitamin D deficiency-continue supplement, labs today  Vaccine counseling-counseled on yearly flu shot, Covid vaccine, shingles vaccine  Of note she had thrombocytopenia on prior labs, but this had resolved by December 2020 labs.  Likewise she had elevated liver test this past year but her last liver panel September 2020 showed normal liver enzymes  Family history of heart disease-Based on  her current health history including non-smoker, no hypertension, age 42, lipid labs from December 2020, her 10-year risk for atherosclerotic cardiovascular disease is 1.4%.  Counseled on healthy low-fat low-cholesterol diet, exercise  She normally has normal blood pressures but her pressure is elevated today.  This may be due to anxiety, pain and having to drive down here from long commute for appointment.  Continue to monitor blood pressure   Shelisa was seen today for medication management.  Diagnoses and all orders for this visit:  Chronic low back pain without sciatica, unspecified back pain laterality -     Drug Screen, Urine -     Comprehensive metabolic panel  Anxiety -     Drug Screen, Urine  Insomnia, unspecified type  Osteoporosis, unspecified osteoporosis type, unspecified pathological fracture presence  Chronic diarrhea -     Comprehensive metabolic panel  Irritable bowel syndrome, unspecified type  Gastroesophageal reflux disease, unspecified whether esophagitis present  High risk medication use -     Drug Screen, Urine -      Comprehensive metabolic panel  Vitamin D deficiency -     VITAMIN D 25 Hydroxy (Vit-D Deficiency, Fractures)  Estrogen deficiency  Post-menopausal  S/P lumbar fusion  Vaccine counseling  Family history of heart disease  Elevated blood-pressure reading without diagnosis of hypertension  Constipation due to opioid therapy  Other orders -     alendronate (FOSAMAX) 70 MG tablet; Take 1 tablet (70 mg total) by mouth once a week. -     traZODone (DESYREL) 50 MG tablet; TAKE 1 TABLET(50 MG) BY MOUTH AT BEDTIME -     DULoxetine (CYMBALTA) 30 MG capsule; Take 1 capsule (30 mg total) by mouth 2 (two) times daily. -     famotidine (PEPCID) 40 MG tablet; TAKE 1 TABLET(40 MG) BY MOUTH DAILY -     Vitamin D, Ergocalciferol, (DRISDOL) 1.25 MG (50000 UNIT) CAPS capsule; Take 1 capsule (50,000 Units total) by mouth once a week. -     ALPRAZolam (XANAX) 1 MG tablet; Take 1 tablet (1 mg total) by mouth at bedtime as needed for anxiety. -     linaclotide (LINZESS) 72 MCG capsule; Take 1 capsule (72 mcg total) by mouth daily before breakfast.    Follow up: pending labs

## 2020-06-01 ENCOUNTER — Other Ambulatory Visit: Payer: Self-pay | Admitting: Medical

## 2020-06-01 LAB — DRUG SCREEN, URINE
Amphetamines, Urine: NEGATIVE ng/mL
Barbiturate screen, urine: NEGATIVE ng/mL
Benzodiazepine Quant, Ur: NEGATIVE ng/mL
Cannabinoid Quant, Ur: NEGATIVE ng/mL
Cocaine (Metab.): NEGATIVE ng/mL
Opiate Quant, Ur: NEGATIVE ng/mL
PCP Quant, Ur: NEGATIVE ng/mL

## 2020-06-01 LAB — COMPREHENSIVE METABOLIC PANEL
ALT: 17 IU/L (ref 0–32)
AST: 20 IU/L (ref 0–40)
Albumin/Globulin Ratio: 2.1 (ref 1.2–2.2)
Albumin: 5.3 g/dL — ABNORMAL HIGH (ref 3.8–4.9)
Alkaline Phosphatase: 88 IU/L (ref 48–121)
BUN/Creatinine Ratio: 8 — ABNORMAL LOW (ref 9–23)
BUN: 6 mg/dL (ref 6–24)
Bilirubin Total: 0.7 mg/dL (ref 0.0–1.2)
CO2: 24 mmol/L (ref 20–29)
Calcium: 10.2 mg/dL (ref 8.7–10.2)
Chloride: 97 mmol/L (ref 96–106)
Creatinine, Ser: 0.73 mg/dL (ref 0.57–1.00)
GFR calc Af Amer: 106 mL/min/{1.73_m2} (ref >59)
GFR calc non Af Amer: 92 mL/min/{1.73_m2} (ref >59)
Globulin, Total: 2.5 g/dL (ref 1.5–4.5)
Glucose: 83 mg/dL (ref 65–99)
Potassium: 4.5 mmol/L (ref 3.5–5.2)
Sodium: 140 mmol/L (ref 134–144)
Total Protein: 7.8 g/dL (ref 6.0–8.5)

## 2020-06-01 LAB — VITAMIN D 25 HYDROXY (VIT D DEFICIENCY, FRACTURES): Vit D, 25-Hydroxy: 31.3 ng/mL (ref 30.0–100.0)

## 2020-06-03 ENCOUNTER — Other Ambulatory Visit: Payer: Self-pay | Admitting: Medical

## 2020-06-03 ENCOUNTER — Telehealth: Payer: Self-pay | Admitting: Medical

## 2020-06-03 NOTE — Telephone Encounter (Signed)
Please call [facility] at  [phone].  I need a provider to call me, Shelia Cutter PA or her supervising physician or one of the pain management providers there.   This is in reference to our mutual patient, Shelia Snyder regarding narcotics/controlled substances.    Per PMP Aware the prescribing provider is Shelia Dollar PA with Shelia Snyder.  The address comes back to this location, and I assume its Shelia Snyder, assuming this is her married name but not sure.        FYI - I called twice Monday 06/03/20, morning and around lunch.  I requested a call back, so far no luck.  They said Shelia Snyder may be out this week on vacation, so I asked for another provider to call me to discuss her (Pienta) case.

## 2020-06-05 NOTE — Telephone Encounter (Signed)
I have called the clinical manager Zenda Alpers [phone] and left message for her to call me.

## 2020-06-06 ENCOUNTER — Other Ambulatory Visit: Payer: Self-pay | Admitting: Medical

## 2020-06-06 ENCOUNTER — Encounter: Payer: Self-pay | Admitting: Medical

## 2020-06-06 NOTE — Telephone Encounter (Signed)
Letter mailed to patient. Letter faxed per request to provider.

## 2020-06-06 NOTE — Telephone Encounter (Signed)
Tia, office manager [facility] called back 3:45pm today.  I spoke to her about my concerns of not getting a call back from there office.   I discussed the case, the drug screen, the attempts we made to call them all week.  Please still fax my letter to them  [facility]

## 2020-06-10 ENCOUNTER — Other Ambulatory Visit: Payer: Self-pay | Admitting: Medical

## 2020-06-12 DIAGNOSIS — K5900 Constipation, unspecified: Secondary | ICD-10-CM | POA: Diagnosis not present

## 2020-06-12 DIAGNOSIS — M5136 Other intervertebral disc degeneration, lumbar region: Secondary | ICD-10-CM | POA: Diagnosis not present

## 2020-06-12 DIAGNOSIS — M4310 Spondylolisthesis, site unspecified: Secondary | ICD-10-CM | POA: Diagnosis not present

## 2020-06-12 DIAGNOSIS — Z79899 Other long term (current) drug therapy: Secondary | ICD-10-CM | POA: Diagnosis not present

## 2020-06-12 DIAGNOSIS — M961 Postlaminectomy syndrome, not elsewhere classified: Secondary | ICD-10-CM | POA: Diagnosis not present

## 2020-06-13 ENCOUNTER — Encounter: Payer: Self-pay | Admitting: Medical

## 2020-06-27 ENCOUNTER — Other Ambulatory Visit: Payer: Self-pay | Admitting: Medical

## 2020-06-27 NOTE — Telephone Encounter (Signed)
[facility] is requesting to fill pt trazodone. Please advise [facility] 

## 2020-07-15 DIAGNOSIS — Z79899 Other long term (current) drug therapy: Secondary | ICD-10-CM | POA: Diagnosis not present

## 2020-07-15 DIAGNOSIS — G894 Chronic pain syndrome: Secondary | ICD-10-CM | POA: Diagnosis not present

## 2020-07-15 DIAGNOSIS — M961 Postlaminectomy syndrome, not elsewhere classified: Secondary | ICD-10-CM | POA: Diagnosis not present

## 2020-07-15 DIAGNOSIS — M4310 Spondylolisthesis, site unspecified: Secondary | ICD-10-CM | POA: Diagnosis not present

## 2020-07-15 DIAGNOSIS — M5136 Other intervertebral disc degeneration, lumbar region: Secondary | ICD-10-CM | POA: Diagnosis not present

## 2020-07-22 ENCOUNTER — Other Ambulatory Visit: Payer: Self-pay | Admitting: Medical

## 2020-08-04 IMAGING — CR DG CHEST 2V
2 series · 2 of 2 positions shown · non-contrast
Comparison: Chest x-ray 09/22/2018.

CLINICAL DATA: Lumbar surgery.

EXAM:
CHEST - 2 VIEW

[w chest pa]
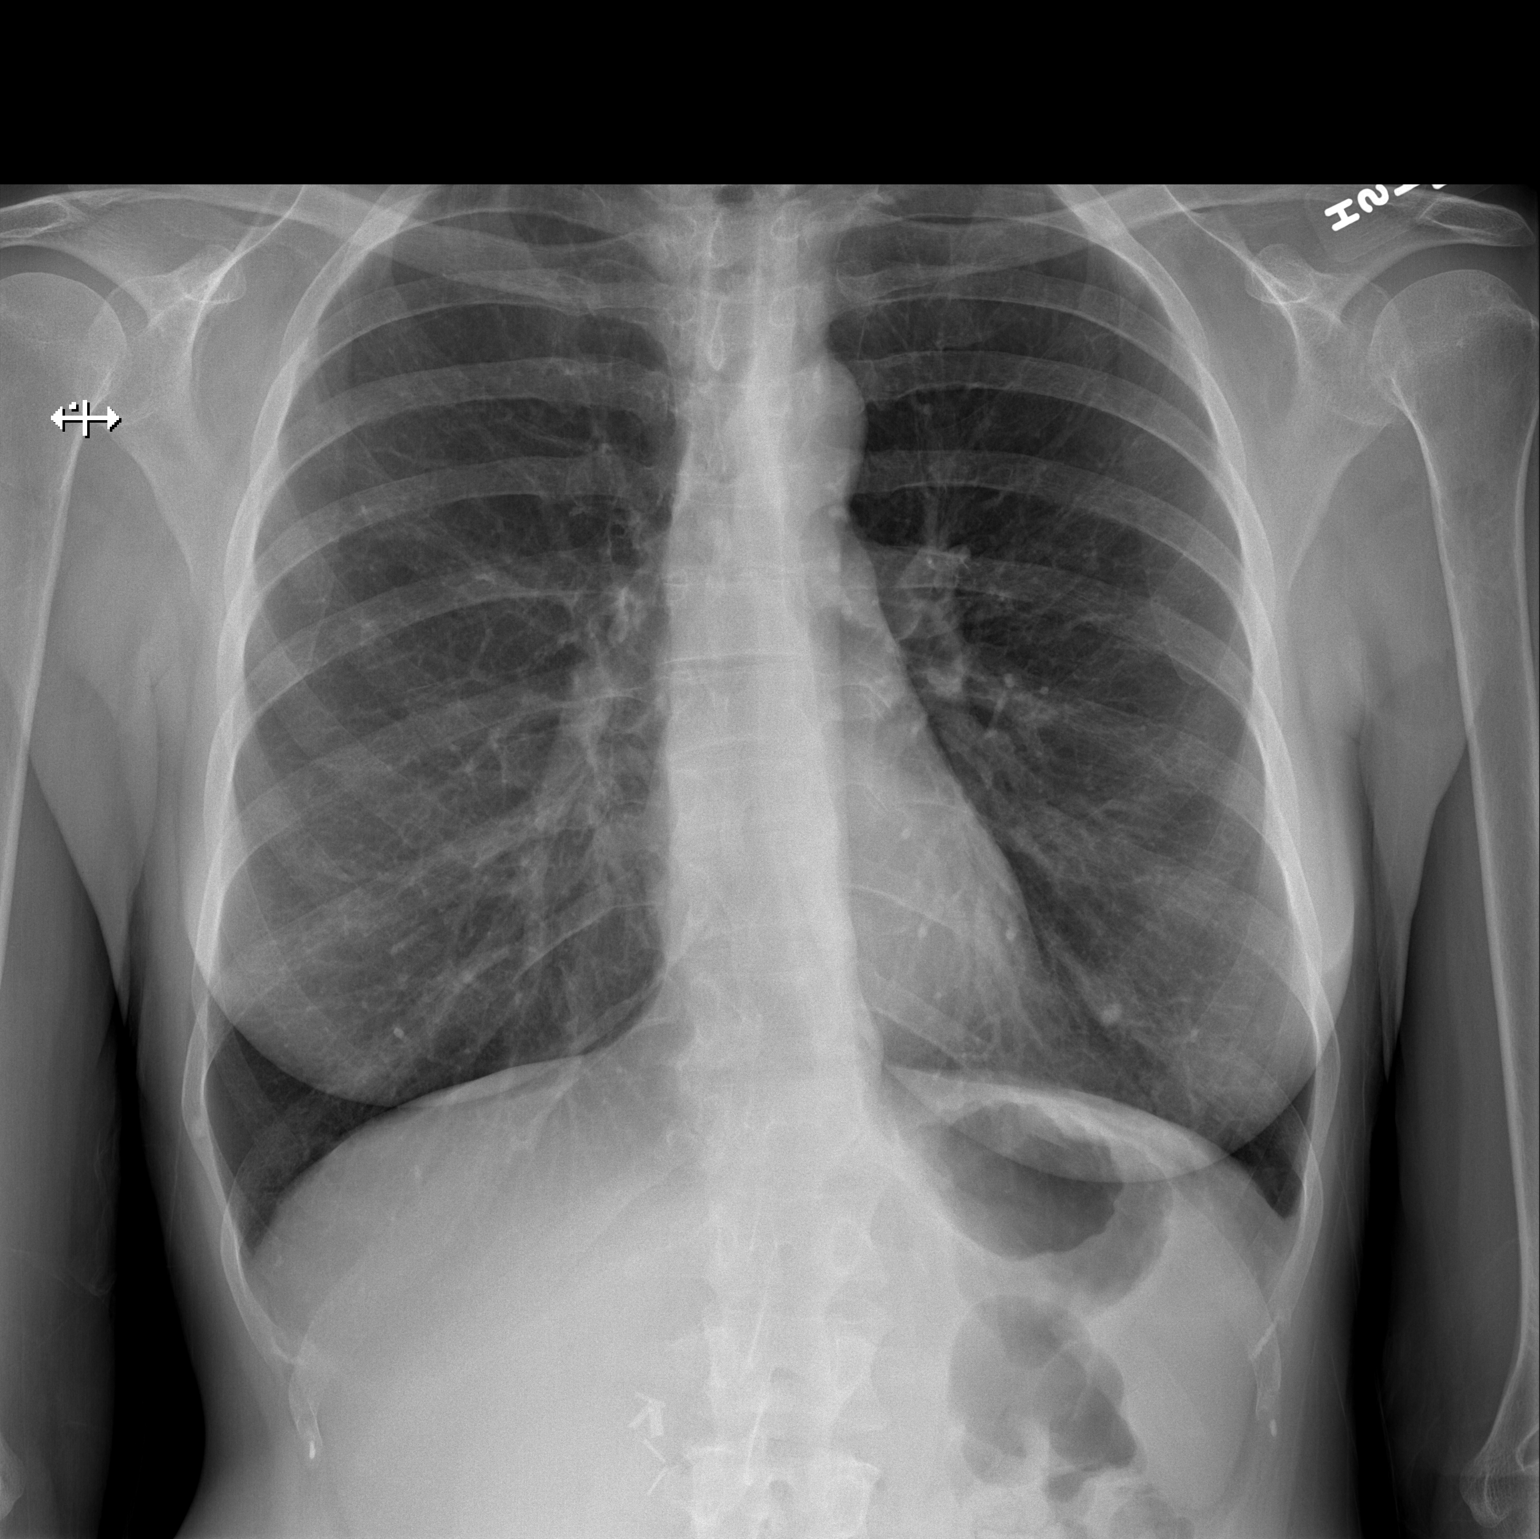

[w chest lat]
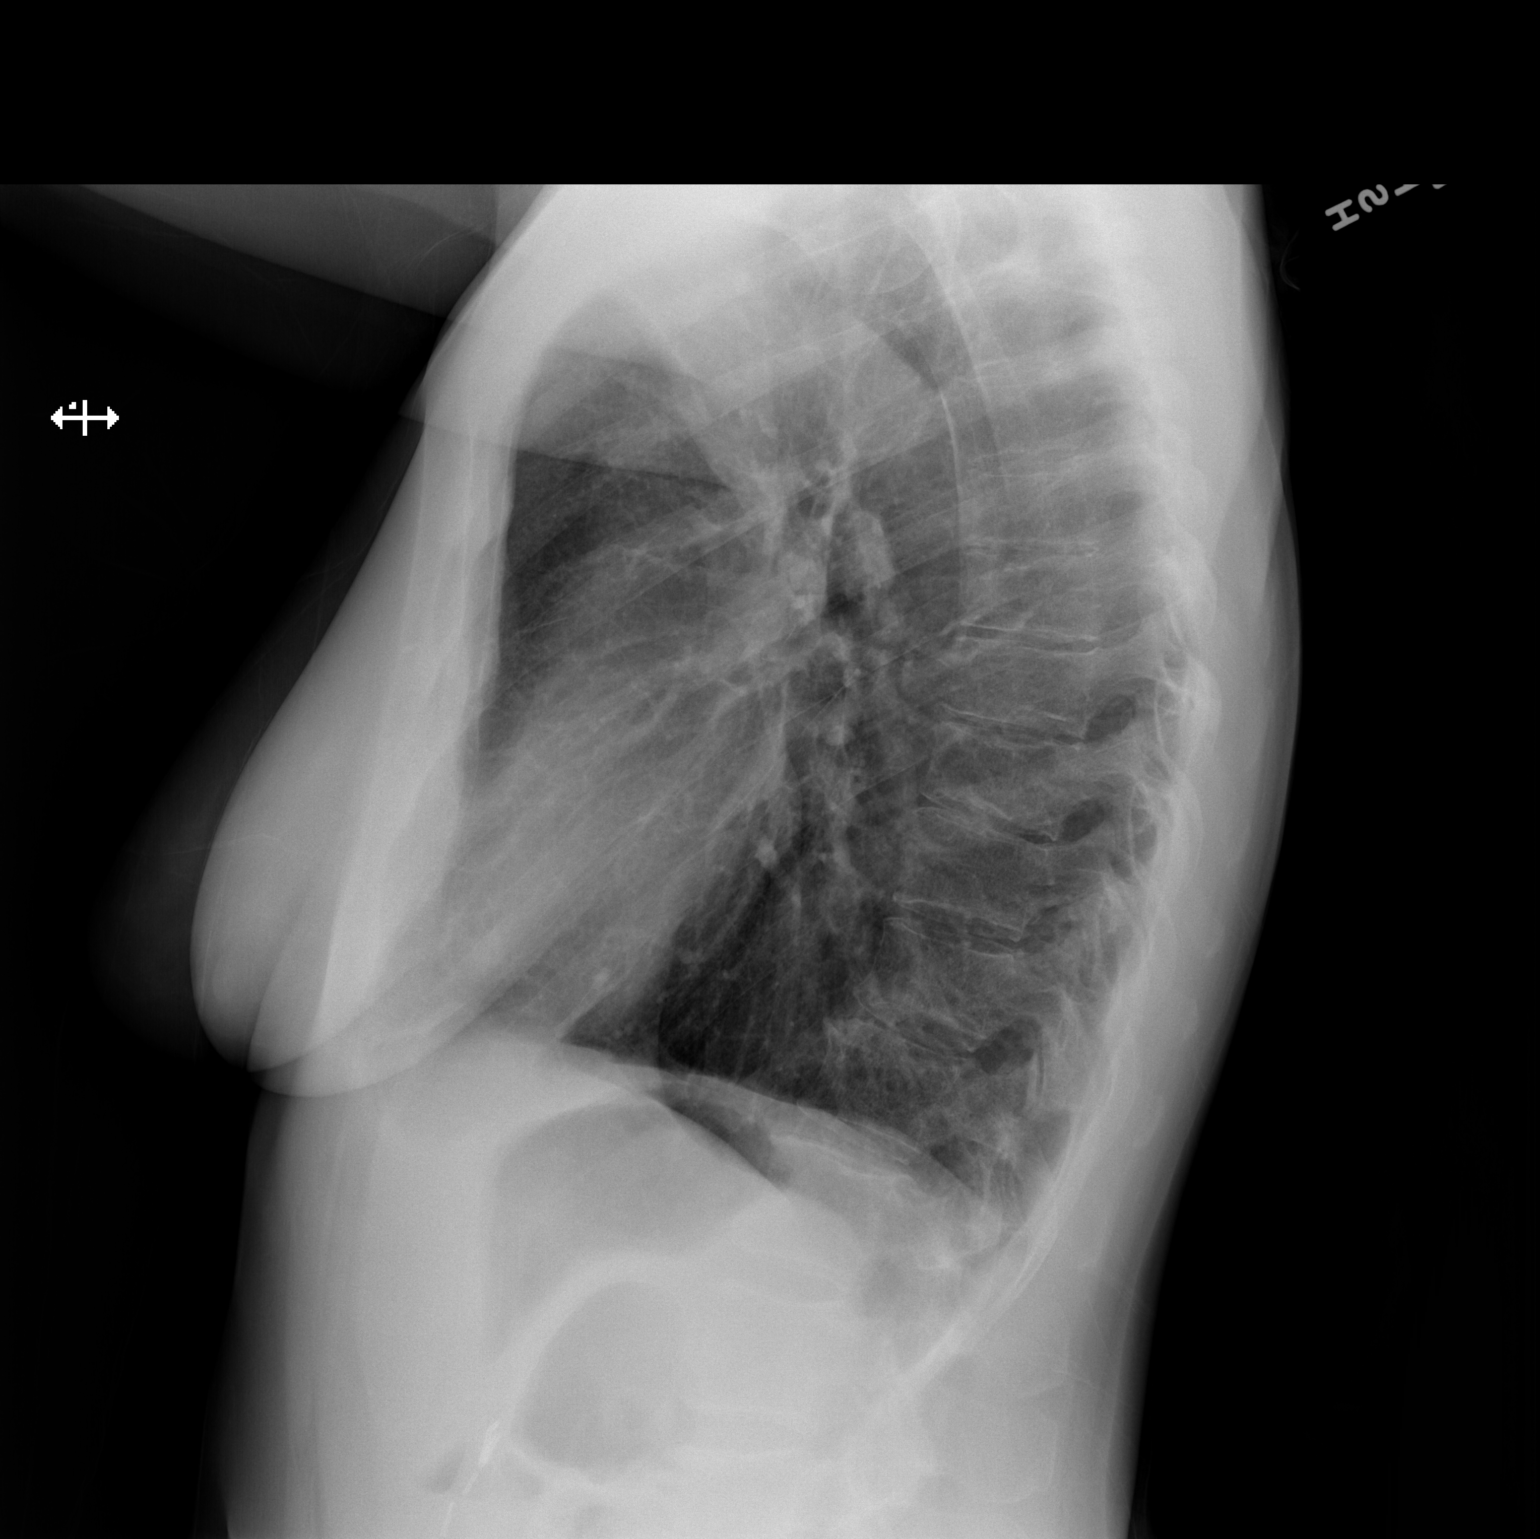

[2 of 2 positions shown; findings below may reference images not displayed]

FINDINGS: Mediastinum and hilar structures normal. Lungs are clear. No pleural
effusion or pneumothorax. Surgical clips right upper quadrant.
Degenerative change and scoliosis thoracic spine. No acute bony
abnormality identified.
IMPRESSION: No acute cardiopulmonary disease.

## 2020-08-06 IMAGING — RF DG C-ARM 1-60 MIN
1 series · 2 of 2 positions shown · non-contrast
Comparison: 06/15/2019

CLINICAL DATA: Lumbar fusion L5-S1

EXAM:
DG C-ARM 1-60 MIN; LUMBAR SPINE - 2-3 VIEW

[Series 1: run · 2 of 2 slices shown]
[im 1/2]
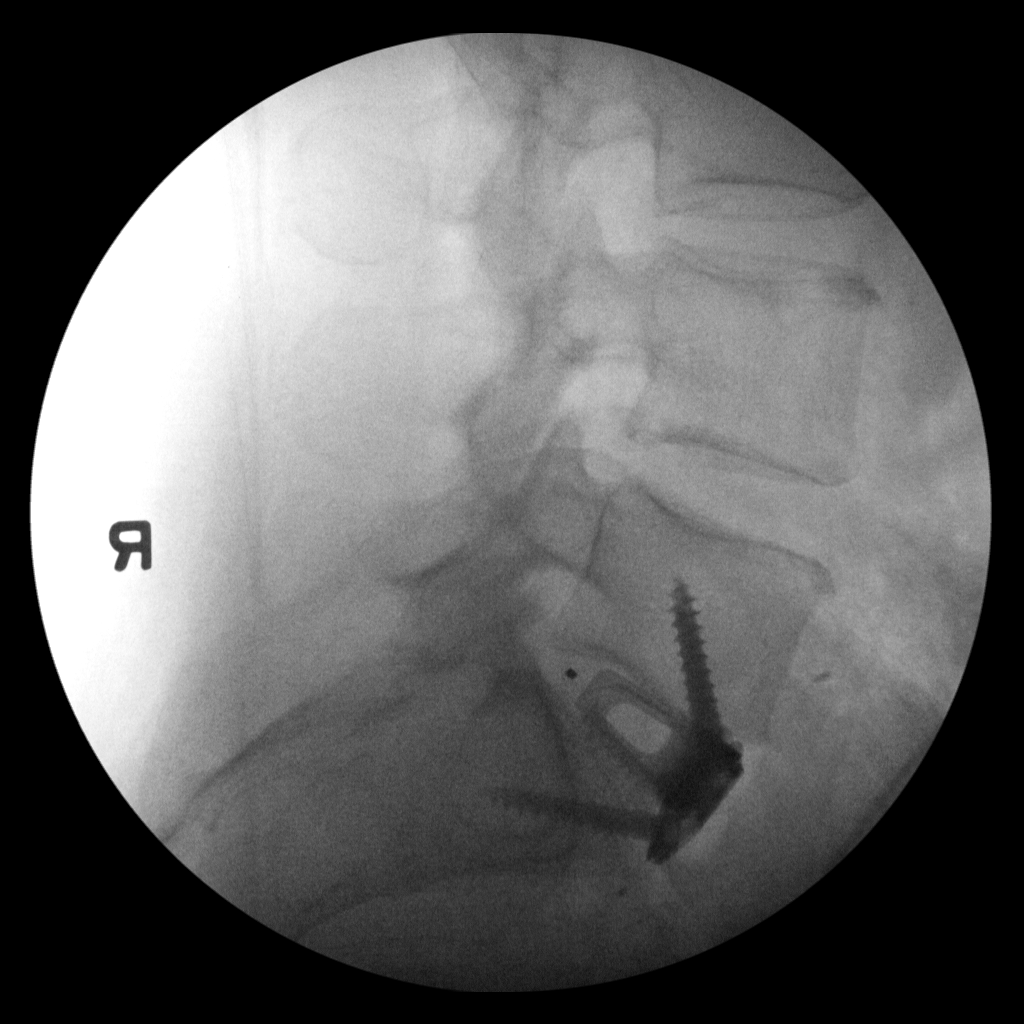
[im 2/2]
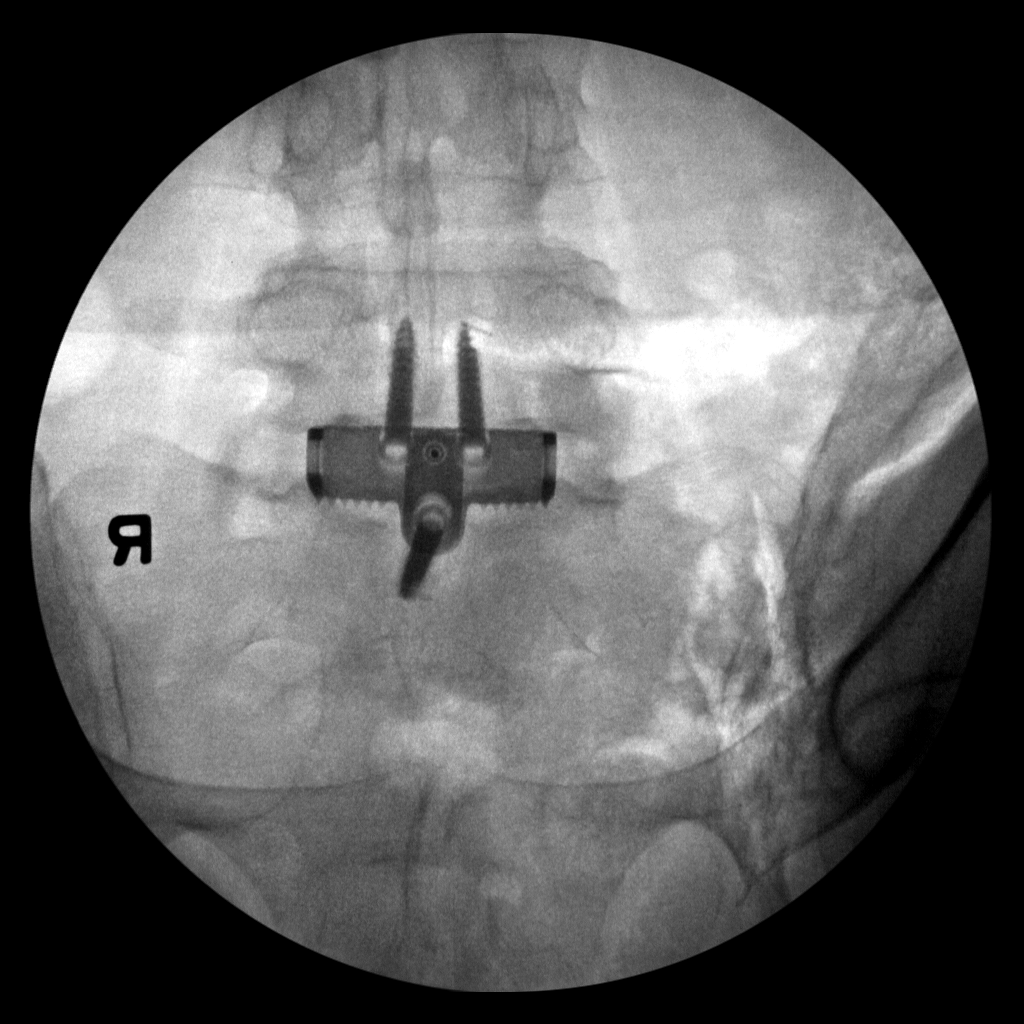

[2 of 2 positions shown; findings below may reference images not displayed]

FINDINGS: AP and lateral C-arm images were obtained of the lumbosacral
junction. A LIF fusion device at L5-S1 in good position. No acute
complication. No retained instrument.
IMPRESSION: Satisfactory ALIF fusion device L5-S1.

## 2020-08-06 IMAGING — CR DG OR LOCAL ABDOMEN
1 series · 1 of 1 positions shown · non-contrast
Comparison: 11/08/2019

CLINICAL DATA: Lumbar fusion.  Rule out instrument or sponge

EXAM:
OR LOCAL ABDOMEN

[AP]
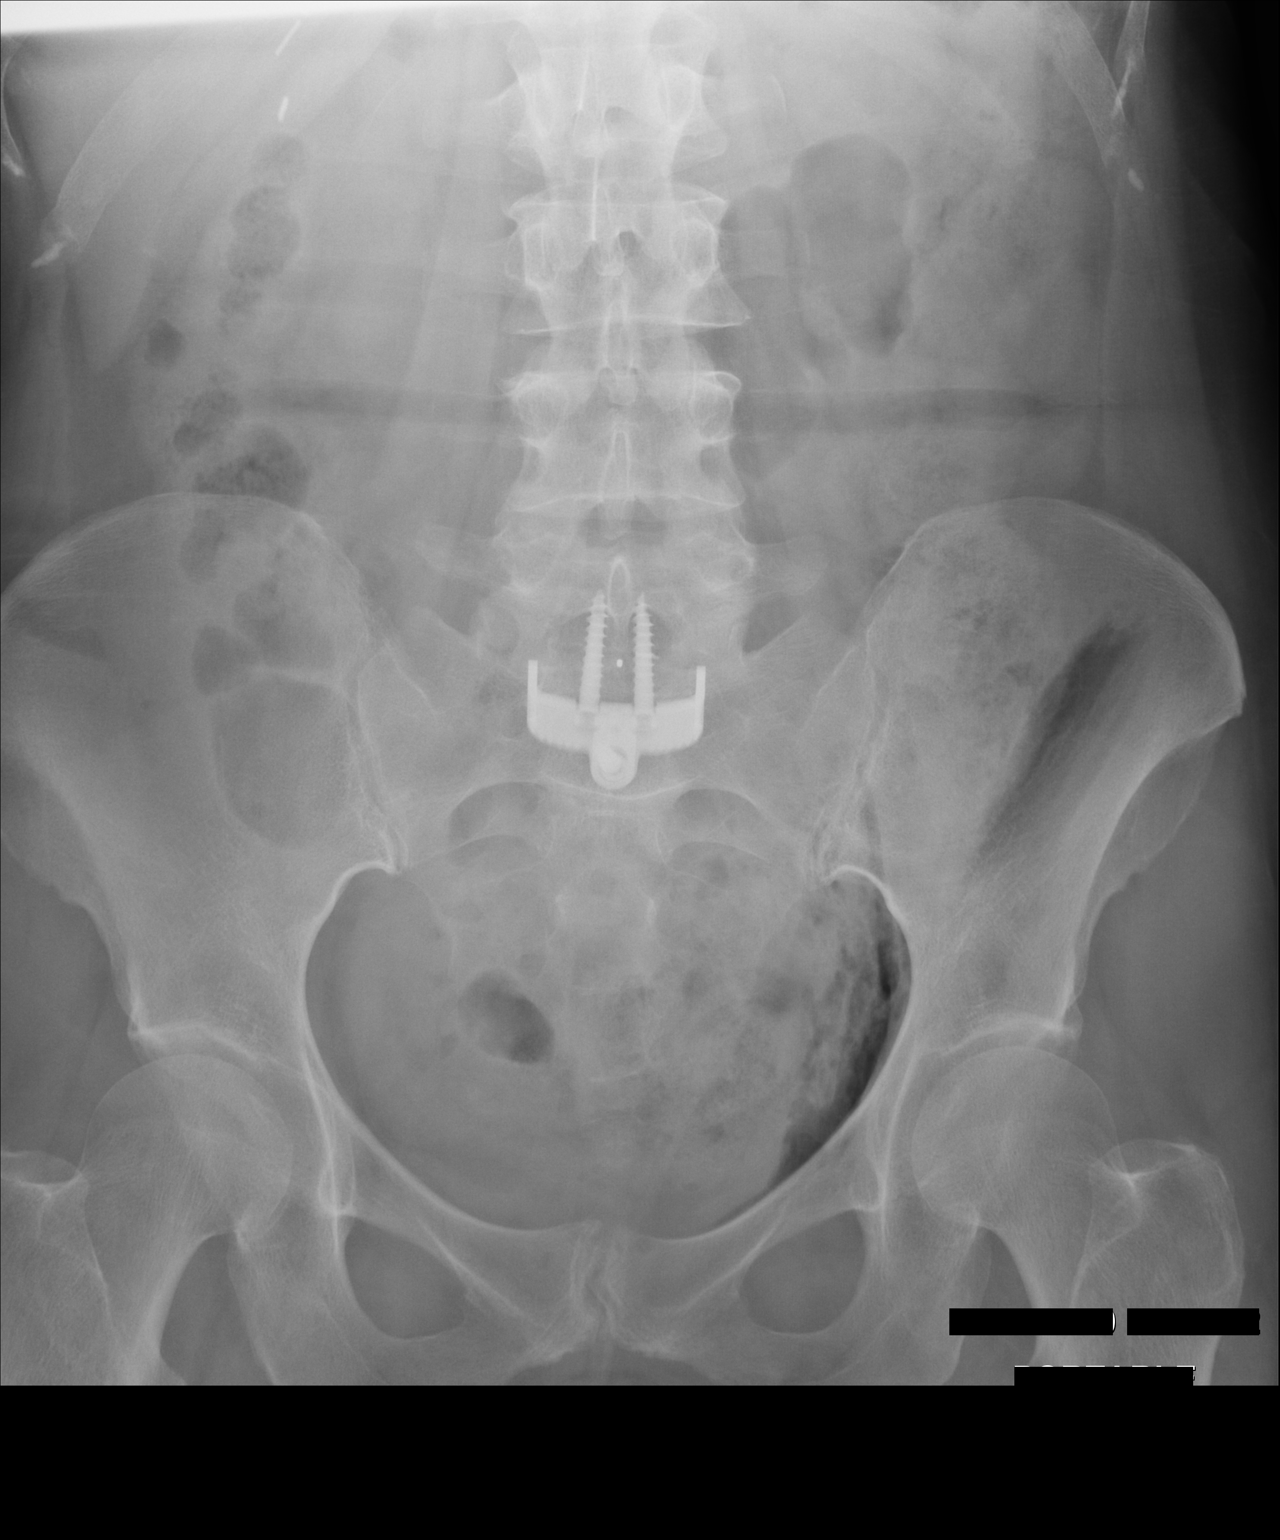

[1 of 1 positions shown; findings below may reference images not displayed]

FINDINGS: ALIF hardware at L5-S1 in good position. Gas in the extraperitoneal
soft tissues left pelvis from surgery.

No retained instrument sponge or needle.  Normal bowel gas pattern.
IMPRESSION: ALIF L5-S1.  No retained instrument

These results were called by telephone at the time of interpretation
on 11/08/2019 at [DATE] to provider Bajam , who verbally
acknowledged these results.

## 2020-08-15 DIAGNOSIS — Z79899 Other long term (current) drug therapy: Secondary | ICD-10-CM | POA: Diagnosis not present

## 2020-08-15 DIAGNOSIS — G894 Chronic pain syndrome: Secondary | ICD-10-CM | POA: Diagnosis not present

## 2020-09-06 DIAGNOSIS — M25561 Pain in right knee: Secondary | ICD-10-CM | POA: Diagnosis not present

## 2020-09-06 DIAGNOSIS — Z6826 Body mass index (BMI) 26.0-26.9, adult: Secondary | ICD-10-CM | POA: Diagnosis not present

## 2020-09-06 DIAGNOSIS — S83241A Other tear of medial meniscus, current injury, right knee, initial encounter: Secondary | ICD-10-CM | POA: Diagnosis not present

## 2020-11-23 ENCOUNTER — Other Ambulatory Visit: Payer: Self-pay | Admitting: Medical

## 2020-11-26 NOTE — Telephone Encounter (Signed)
Call and see if she is still using this or requested refill

## 2020-11-27 NOTE — Telephone Encounter (Signed)
Message has been sent to patient via mychart inquiring about whether she is still taking this medication. Tried to call but no answer.

## 2021-05-06 ENCOUNTER — Other Ambulatory Visit: Payer: Self-pay | Admitting: Medical

## 2022-05-06 ENCOUNTER — Other Ambulatory Visit (HOSPITAL_COMMUNITY): Payer: Self-pay

## 2022-05-06 MED ORDER — MORPHINE SULFATE 15 MG PO TABS
15.0000 mg | ORAL_TABLET | Freq: Two times a day (BID) | ORAL | 0 refills | Status: DC
  Filled 2022-05-06: qty 60, 30d supply, fill #0

## 2022-05-06 MED ORDER — OXYCODONE-ACETAMINOPHEN 10-325 MG PO TABS
1.0000 | ORAL_TABLET | Freq: Four times a day (QID) | ORAL | 0 refills | Status: AC | PRN
  Filled 2022-05-06: qty 12, 3d supply, fill #0
  Filled 2022-05-06: qty 120, 30d supply, fill #0

## 2022-05-18 DIAGNOSIS — R300 Dysuria: Secondary | ICD-10-CM | POA: Diagnosis not present

## 2022-05-20 ENCOUNTER — Other Ambulatory Visit: Payer: Self-pay

## 2022-05-20 ENCOUNTER — Inpatient Hospital Stay (HOSPITAL_COMMUNITY)
Admission: EM | Admit: 2022-05-20 | Discharge: 2022-05-22 | DRG: 690 | Disposition: A | Discharge status: Home / Self Care | Payer: 59 | Attending: Internal Medicine | Admitting: Internal Medicine

## 2022-05-20 ENCOUNTER — Emergency Department (HOSPITAL_COMMUNITY): Payer: 59

## 2022-05-20 ENCOUNTER — Encounter (HOSPITAL_COMMUNITY): Payer: Self-pay | Admitting: *Deleted

## 2022-05-20 DIAGNOSIS — Z882 Allergy status to sulfonamides status: Secondary | ICD-10-CM

## 2022-05-20 DIAGNOSIS — Z91030 Bee allergy status: Secondary | ICD-10-CM | POA: Diagnosis not present

## 2022-05-20 DIAGNOSIS — K219 Gastro-esophageal reflux disease without esophagitis: Secondary | ICD-10-CM | POA: Diagnosis present

## 2022-05-20 DIAGNOSIS — N12 Tubulo-interstitial nephritis, not specified as acute or chronic: Principal | ICD-10-CM | POA: Diagnosis present

## 2022-05-20 DIAGNOSIS — F419 Anxiety disorder, unspecified: Secondary | ICD-10-CM | POA: Diagnosis present

## 2022-05-20 DIAGNOSIS — Z79899 Other long term (current) drug therapy: Secondary | ICD-10-CM | POA: Diagnosis not present

## 2022-05-20 DIAGNOSIS — Z87442 Personal history of urinary calculi: Secondary | ICD-10-CM

## 2022-05-20 DIAGNOSIS — M810 Age-related osteoporosis without current pathological fracture: Secondary | ICD-10-CM | POA: Diagnosis not present

## 2022-05-20 DIAGNOSIS — G894 Chronic pain syndrome: Secondary | ICD-10-CM | POA: Diagnosis not present

## 2022-05-20 DIAGNOSIS — Z87891 Personal history of nicotine dependence: Secondary | ICD-10-CM | POA: Diagnosis not present

## 2022-05-20 DIAGNOSIS — E876 Hypokalemia: Secondary | ICD-10-CM | POA: Diagnosis present

## 2022-05-20 DIAGNOSIS — F32A Depression, unspecified: Secondary | ICD-10-CM | POA: Diagnosis present

## 2022-05-20 DIAGNOSIS — Z87892 Personal history of anaphylaxis: Secondary | ICD-10-CM

## 2022-05-20 DIAGNOSIS — F418 Other specified anxiety disorders: Secondary | ICD-10-CM | POA: Diagnosis present

## 2022-05-20 DIAGNOSIS — M5136 Other intervertebral disc degeneration, lumbar region: Secondary | ICD-10-CM | POA: Diagnosis not present

## 2022-05-20 DIAGNOSIS — Z91040 Latex allergy status: Secondary | ICD-10-CM

## 2022-05-20 DIAGNOSIS — R188 Other ascites: Secondary | ICD-10-CM | POA: Diagnosis not present

## 2022-05-20 DIAGNOSIS — N390 Urinary tract infection, site not specified: Secondary | ICD-10-CM | POA: Diagnosis not present

## 2022-05-20 DIAGNOSIS — M4310 Spondylolisthesis, site unspecified: Secondary | ICD-10-CM | POA: Diagnosis present

## 2022-05-20 DIAGNOSIS — N10 Acute tubulo-interstitial nephritis: Secondary | ICD-10-CM

## 2022-05-20 DIAGNOSIS — R69 Illness, unspecified: Secondary | ICD-10-CM | POA: Diagnosis not present

## 2022-05-20 LAB — CBC WITH DIFFERENTIAL/PLATELET
Abs Immature Granulocytes: 0.06 10*3/uL (ref 0.00–0.07)
Basophils Absolute: 0 10*3/uL (ref 0.0–0.1)
Basophils Relative: 0 %
Eosinophils Absolute: 0 10*3/uL (ref 0.0–0.5)
Eosinophils Relative: 0 %
HCT: 37.6 % (ref 36.0–46.0)
Hemoglobin: 12.8 g/dL (ref 12.0–15.0)
Immature Granulocytes: 0 %
Lymphocytes Relative: 7 %
Lymphs Abs: 0.9 10*3/uL (ref 0.7–4.0)
MCH: 31.4 pg (ref 26.0–34.0)
MCHC: 34 g/dL (ref 30.0–36.0)
MCV: 92.4 fL (ref 80.0–100.0)
Monocytes Absolute: 1.4 10*3/uL — ABNORMAL HIGH (ref 0.1–1.0)
Monocytes Relative: 11 %
Neutro Abs: 11 10*3/uL — ABNORMAL HIGH (ref 1.7–7.7)
Neutrophils Relative %: 82 %
Platelets: 359 10*3/uL (ref 150–400)
RBC: 4.07 MIL/uL (ref 3.87–5.11)
RDW: 13 % (ref 11.5–15.5)
WBC: 13.4 10*3/uL — ABNORMAL HIGH (ref 4.0–10.5)
nRBC: 0 % (ref 0.0–0.2)

## 2022-05-20 LAB — URINALYSIS, ROUTINE W REFLEX MICROSCOPIC
Bilirubin Urine: NEGATIVE
Glucose, UA: NEGATIVE mg/dL
Hgb urine dipstick: NEGATIVE
Ketones, ur: NEGATIVE mg/dL
Nitrite: NEGATIVE
Protein, ur: NEGATIVE mg/dL
Specific Gravity, Urine: 1.006 (ref 1.005–1.030)
pH: 6 (ref 5.0–8.0)

## 2022-05-20 LAB — BASIC METABOLIC PANEL
Anion gap: 10 (ref 5–15)
BUN: 11 mg/dL (ref 6–20)
CO2: 26 mmol/L (ref 22–32)
Calcium: 8.6 mg/dL — ABNORMAL LOW (ref 8.9–10.3)
Chloride: 100 mmol/L (ref 98–111)
Creatinine, Ser: 0.67 mg/dL (ref 0.44–1.00)
GFR, Estimated: >60 mL/min (ref >60)
Glucose, Bld: 111 mg/dL — ABNORMAL HIGH (ref 70–99)
Potassium: 2.9 mmol/L — ABNORMAL LOW (ref 3.5–5.1)
Sodium: 136 mmol/L (ref 135–145)

## 2022-05-20 LAB — MAGNESIUM: Magnesium: 2.1 mg/dL (ref 1.7–2.4)

## 2022-05-20 MED ORDER — OXYCODONE HCL 5 MG PO TABS: 10.0000 mg | ORAL_TABLET | Freq: Three times a day (TID) | ORAL | Status: DC | PRN

## 2022-05-20 MED ORDER — ALPRAZOLAM 1 MG PO TABS
1.0000 mg | ORAL_TABLET | Freq: Every evening | ORAL | Status: DC | PRN
  Administered 2022-05-20 – 2022-05-22 (×2): 1 mg via ORAL
  Filled 2022-05-20 (×2): qty 1

## 2022-05-20 MED ORDER — POTASSIUM CHLORIDE CRYS ER 20 MEQ PO TBCR
40.0000 meq | EXTENDED_RELEASE_TABLET | Freq: Once | ORAL | Status: AC
Start: 2022-05-20 — End: 2022-05-20
  Administered 2022-05-20: 40 meq via ORAL
  Filled 2022-05-20: qty 2

## 2022-05-20 MED ORDER — OXYCODONE HCL ER 10 MG PO T12A
10.0000 mg | EXTENDED_RELEASE_TABLET | Freq: Two times a day (BID) | ORAL | Status: DC
  Administered 2022-05-20 – 2022-05-22 (×4): 10 mg via ORAL
  Filled 2022-05-20 (×4): qty 1

## 2022-05-20 MED ORDER — TIZANIDINE HCL 2 MG PO TABS
2.0000 mg | ORAL_TABLET | Freq: Three times a day (TID) | ORAL | Status: DC | PRN
Start: 2022-05-20 — End: 2022-05-22

## 2022-05-20 MED ORDER — POTASSIUM CHLORIDE 10 MEQ/100ML IV SOLN
10.0000 meq | Freq: Once | INTRAVENOUS | Status: AC
  Administered 2022-05-20: 10 meq via INTRAVENOUS
  Filled 2022-05-20: qty 100

## 2022-05-20 MED ORDER — POTASSIUM CHLORIDE CRYS ER 20 MEQ PO TBCR
40.0000 meq | EXTENDED_RELEASE_TABLET | ORAL | Status: AC
  Administered 2022-05-20: 40 meq via ORAL
  Filled 2022-05-20: qty 2

## 2022-05-20 MED ORDER — MAGNESIUM SULFATE 2 GM/50ML IV SOLN
2.0000 g | Freq: Once | INTRAVENOUS | Status: AC
  Administered 2022-05-20: 2 g via INTRAVENOUS
  Filled 2022-05-20: qty 50

## 2022-05-20 MED ORDER — IOHEXOL 300 MG/ML  SOLN
100.0000 mL | Freq: Once | INTRAMUSCULAR | Status: AC | PRN
  Administered 2022-05-20: 100 mL via INTRAVENOUS

## 2022-05-20 MED ORDER — ACETAMINOPHEN 650 MG RE SUPP: 650.0000 mg | Freq: Four times a day (QID) | RECTAL | Status: DC | PRN

## 2022-05-20 MED ORDER — HEPARIN SODIUM (PORCINE) 5000 UNIT/ML IJ SOLN
5000.0000 [IU] | Freq: Three times a day (TID) | INTRAMUSCULAR | Status: DC
  Administered 2022-05-20 – 2022-05-22 (×5): 5000 [IU] via SUBCUTANEOUS
  Filled 2022-05-20 (×5): qty 1

## 2022-05-20 MED ORDER — SODIUM CHLORIDE 0.9 % IV SOLN
2.0000 g | Freq: Three times a day (TID) | INTRAVENOUS | Status: DC
  Administered 2022-05-20 – 2022-05-22 (×6): 2 g via INTRAVENOUS
  Filled 2022-05-20 (×8): qty 12.5

## 2022-05-20 MED ORDER — GABAPENTIN 300 MG PO CAPS
600.0000 mg | ORAL_CAPSULE | Freq: Three times a day (TID) | ORAL | Status: DC
  Administered 2022-05-20 – 2022-05-22 (×5): 600 mg via ORAL
  Filled 2022-05-20 (×5): qty 2

## 2022-05-20 MED ORDER — DULOXETINE HCL 30 MG PO CPEP
30.0000 mg | ORAL_CAPSULE | Freq: Two times a day (BID) | ORAL | Status: DC
  Administered 2022-05-20 – 2022-05-22 (×4): 30 mg via ORAL
  Filled 2022-05-20 (×4): qty 1

## 2022-05-20 MED ORDER — SODIUM CHLORIDE 0.9 % IV SOLN: INTRAVENOUS | Status: DC

## 2022-05-20 MED ORDER — ONDANSETRON HCL 4 MG/2ML IJ SOLN: 4.0000 mg | Freq: Four times a day (QID) | INTRAMUSCULAR | Status: DC | PRN

## 2022-05-20 MED ORDER — PANTOPRAZOLE SODIUM 40 MG PO TBEC
40.0000 mg | DELAYED_RELEASE_TABLET | Freq: Every day | ORAL | Status: DC
  Administered 2022-05-20 – 2022-05-22 (×3): 40 mg via ORAL
  Filled 2022-05-20 (×3): qty 1

## 2022-05-20 MED ORDER — ACETAMINOPHEN 325 MG PO TABS: 650.0000 mg | ORAL_TABLET | Freq: Four times a day (QID) | ORAL | Status: DC | PRN

## 2022-05-20 MED ORDER — SODIUM CHLORIDE 0.9 % IV SOLN: 2.0000 g | Freq: Once | INTRAVENOUS | Status: DC

## 2022-05-20 NOTE — ED Triage Notes (Signed)
Pt c/o fevers with headache and neck stiffness  Pt diagnosed with UTI and has had doxycycline and Rocephin  Pt was seen at Urgent care on Monday and told she has a UTI; pt states she has intermittent burning with urination  Pt had ibuprofen at 0830  Fevers at home have been as high as 105

## 2022-05-20 NOTE — H&P (Signed)
History and Physical    Patient: Shelia Snyder [id number] DOB: 1966/01/01 DOA: 05/20/2022 DOS: the patient was seen and examined on 05/20/2022 PCP: Simona Huh, NP  Patient coming from: Home  Chief Complaint:  Chief Complaint  Patient presents with   Fever   HPI: Shelia Snyder is a 56 y.o. female with medical history significant of depression with anxiety, chronic back pain (actively on chronic pain management), vitamin D deficiency/osteoporosis history and gastroesophageal reflux disease; who presented to the emergency department with complaints of nausea, vomiting, high-grade fevers, dysuria, increased frequency and flank pain.  Patient reports symptom has been present for the last 1-2 weeks.  There has not been any blood content in her emesis and expressed high-grade temperature at home 103-104 Fahrenheit.  Patient has visited urgent care/PCP with diagnosis of UTI and failure to outpatient therapy after receiving doxycycline and 48 hours of Cipro. Patient reports flank pain bilaterally is new and distinguishable from her chronic back discomfort for which she is followed at the pain clinic. No chest pain, no focal weaknesses, no sick contacts, no abdominal pain, no shortness of breath, productive coughing spells or any other complaints. In the ED CT scan abdomen and pelvis demonstrating positive changes of pyelonephritis bilaterally (left more than right) without the presence of organized abscesses. Cultures taken, antibiotics started and TRH consulted to place patient in the hospital for further evaluation and management.  Review of Systems: As mentioned in the history of present illness. All other systems reviewed and are negative. Past Medical History:  Diagnosis Date   Anxiety    Chronic low back pain    Degeneration of lumbar intervertebral disc    Degenerative spondylolisthesis    Depression    Diarrhea    Elevated liver enzymes    IBS (irritable bowel syndrome) 09/04/2011    Left leg pain    related to L4-L5 ruptured disc   Osteoporosis    Past Surgical History:  Procedure Laterality Date   ABDOMINAL EXPOSURE N/A 11/08/2019   Procedure: ABDOMINAL EXPOSURE;  Surgeon: Rosetta Posner, MD;  Location: [facility] OR;  Service: Vascular;  Laterality: N/A;   ANTERIOR LUMBAR FUSION N/A 11/08/2019   Procedure: ANTERIOR LUMBAR FUSION LUMBAR FIVE-SACRAL ONE;  Surgeon: Melina Schools, MD;  Location: [place];  Service: Orthopedics;  Laterality: N/A;   BACK SURGERY  2000   L4-L5 discectomy   BREAST LUMPECTOMY     benign, right   CHOLECYSTECTOMY     COLONOSCOPY  09/05/2011   Procedure: COLONOSCOPY;  Surgeon: Rogene Houston, MD;  Location: [facility];  Service: Endoscopy;  Laterality: N/A;   COLONOSCOPY  2016   melanosis coli, Dr. Doristine Mango   OOPHORECTOMY     right, abnormal bleeding after hysterectomy   TOTAL ABDOMINAL HYSTERECTOMY     has 1 ovary, hx/o heavy bleeding, s/p ablation   Social History:  reports that she has quit smoking. She has a 19.00 pack-year smoking history. She has never used smokeless tobacco. She reports that she does not drink alcohol and does not use drugs.  Allergies  Allergen Reactions   Bee Venom Anaphylaxis   Sulfa Antibiotics Anaphylaxis   Latex Rash    Family History  Problem Relation Age of Onset   Heart attack Mother 78       MI   Aneurysm Mother        abdominal, smoker   Hypertension Mother    Heart disease Mother 46   COPD Father  Diabetes Maternal Uncle    Cancer Maternal Grandmother        colon   Cancer Paternal Grandmother        breast   Stroke Neg Hx     Prior to Admission medications   Medication Sig Start Date End Date Taking? Authorizing Provider  ALPRAZolam Prudy Feeler) 1 MG tablet Take 1 tablet (1 mg total) by mouth at bedtime as needed for anxiety. 05/31/20  Yes Tysinger, Kermit Balo, PA-C  DULoxetine (CYMBALTA) 30 MG capsule Take 1 capsule (30 mg total) by mouth 2 (two) times daily. 05/31/20  Yes Tysinger,  Kermit Balo, PA-C  gabapentin (NEURONTIN) 600 MG tablet TAKE 1 TABLET(600 MG) BY MOUTH THREE TIMES DAILY 07/24/20  Yes Tysinger, Kermit Balo, PA-C  ibuprofen (ADVIL) 800 MG tablet Take 800 mg by mouth every 8 (eight) hours as needed.   Yes [provider]  morphine (MSIR) 15 MG tablet Take 1 tablet (15 mg total) by mouth 2 (two) times daily. 05/06/22  Yes   oxyCODONE ER (XTAMPZA ER) 9 MG C12A Xtampza ER 9 mg capsule sprinkle  TAKE 1 CAPSULE BY MOUTH TWICE DAILY   Yes [provider]  tiZANidine (ZANAFLEX) 4 MG tablet Take 2-4 mg by mouth 4 (four) times daily as needed for muscle spasms. 05/06/22  Yes [provider]  alendronate (FOSAMAX) 70 MG tablet TAKE 1 TABLET(70 MG) BY MOUTH 1 TIME A WEEK Patient not taking: Reported on 05/20/2022 05/06/21   Tysinger, Kermit Balo, PA-C  Eluxadoline (VIBERZI) 75 MG TABS Take 1 tablet by mouth 2 (two) times daily. Patient not taking: Reported on 05/20/2022 04/27/19   Jac Canavan, PA-C  famotidine (PEPCID) 40 MG tablet TAKE 1 TABLET(40 MG) BY MOUTH DAILY Patient not taking: Reported on 05/20/2022 05/31/20   Tysinger, Kermit Balo, PA-C  linaclotide [facility]) 72 MCG capsule Take 1 capsule (72 mcg total) by mouth daily before breakfast. Patient not taking: Reported on 05/20/2022 05/31/20   Tysinger, Kermit Balo, PA-C  oxyCODONE-acetaminophen (PERCOCET) 10-325 MG tablet Take 1 tablet by mouth every 6 (six) hours as needed. Patient not taking: Reported on 05/20/2022 05/06/22     oxyCODONE-acetaminophen (PERCOCET/ROXICET) 5-325 MG tablet oxycodone-acetaminophen 5 mg-325 mg tablet  TAKE 1 TABLET BY MOUTH EVERY 6 HOURS AS NEEDED Patient not taking: Reported on 05/20/2022    [provider]  traZODone (DESYREL) 50 MG tablet TAKE 1 TABLET(50 MG) BY MOUTH AT BEDTIME Patient not taking: Reported on 05/20/2022 06/27/20   Ronnald Nian, MD  Vitamin D, Ergocalciferol, (DRISDOL) 1.25 MG (50000 UNIT) CAPS capsule Take 1 capsule (50,000 Units total) by mouth once a  week. Patient not taking: Reported on 05/20/2022 05/31/20   Jac Canavan, PA-C    Physical Exam: Vitals:   05/20/22 1500 05/20/22 1530 05/20/22 1553 05/20/22 1618  BP: 99/60 110/64  (!) 119/48  Pulse: 79 82  85  Resp: 18 18  18   Temp:   98.3 F (36.8 C) 97.7 F (36.5 C)  TempSrc:   Oral Oral  SpO2: 94% 100%  94%  Weight:    66.1 kg  Height:    5\' 4"  (1.626 m)   General exam: Alert, awake, oriented x 3; no chest pain, reports feeling hungry at time of examination and expressed no nausea or vomiting currently.  Patient is afebrile here follows. Respiratory system: Clear to auscultation. Respiratory effort normal.  Good saturation on room air. Cardiovascular system:RRR. No murmurs, rubs, gallops.  No JVD. Gastrointestinal system: Abdomen is nondistended,  soft and nontender. No organomegaly or masses felt. Normal bowel sounds heard.  Positive bilateral flank discomfort on palpation. Central nervous system: Alert and oriented. No focal neurological deficits. Extremities: No cyanosis, clubbing or edema. Skin: No petechiae. Psychiatry: Judgement and insight appear normal. Mood & affect appropriate.   Data Reviewed: CBC: WBCs 13.4, hemoglobin 12.8, platelet count 359; absolute neutrophils 11.0 Basic metabolic panel: Sodium 136, potassium 2.9, chloride 100, BUN 11, creatinine 0.67. Magnesium 2.1 Blood cultures/urine culture (pending) Urinalysis with large leukocyte esterase, 21-50 WBCs and rare bacteria.  Assessment and Plan: * UTI (urinary tract infection) - Patient with fever, nausea and vomiting. -Also reporting increased frequency and dysuria. -Has failed outpatient therapy. -Started on cefepime -Follow culture results -Provide fluid resuscitation, analgesics and antiemetics as needed. -Follow clinical response.  Depression with anxiety - Stable mood overall -No suicidal ideation or hallucination -As needed Xanax and a scheduled dose of Cymbalta.   Chronic pain  syndrome -Patient with chronic back pain; actively followed by pain clinic as an outpatient. -Continue the use of as needed oxycodone and every 12 hours extended release oxycodone equivalent (at home patient uses Xtampza 9 mg every 12 hours). -Also continue as needed Zanaflex and 3 times a day's Neurontin.  GERD (gastroesophageal reflux disease) - Continue PPI.  Hypokalemia: -Potassium 2.9 at time of admission in the setting of GI losses most likely with decreased oral intake. -Magnesium within normal limit -Will replete electrolytes and follow trend.    Advance Care Planning:   Code Status: Full Code   Consults: None  Family Communication: No family at bedside  Severity of Illness: The appropriate patient status for this patient is INPATIENT. Inpatient status is judged to be reasonable and necessary in order to provide the required intensity of service to ensure the patient's safety. The patient's presenting symptoms, physical exam findings, and initial radiographic and laboratory data in the context of their chronic comorbidities is felt to place them at high risk for further clinical deterioration. Furthermore, it is not anticipated that the patient will be medically stable for discharge from the hospital within 2 midnights of admission.   * I certify that at the point of admission it is my clinical judgment that the patient will require inpatient hospital care spanning beyond 2 midnights from the point of admission due to high intensity of service, high risk for further deterioration and high frequency of surveillance required.*  Author: Vassie Loll, MD 05/20/2022 6:47 PM  For on call review [email or web address].

## 2022-05-20 NOTE — ED Provider Notes (Signed)
[facility] EMERGENCY DEPARTMENT Provider Note   CSN: [id number] Arrival date & time: 05/20/22  0941     History  Chief Complaint  Patient presents with   Fever    Shelia Snyder is a 56 y.o. female.  HPI  Medical history including depression, anxiety, presents with complaints of fevers and a UTI.  Patient states that she is having fever for last 2 weeks, states that her temperatures got up as high as 105, she endorses dysuria and difficulty urination, and slight back pain but denies stomach pains nausea vomiting diarrhea constipation denies any vaginal discharge vaginal bleeding, states that she has history of kidney stones but states this does not feel like that.  She denies any URI-like symptoms, she notes that she took doxycycline that was her husband's was taken to once daily for the last week, notes that her symptoms not improve so she went to urgent care where they noted that she had a UTI and started on antibiotics and came here because her temperature has been elevated.  Seen in urgent care on the third, UA is consistent with UTI, given a shot of Rocephin, and was started on ciprofloxacin.    Home Medications Prior to Admission medications   Medication Sig Start Date End Date Taking? Authorizing Provider  ALPRAZolam Prudy Feeler) 1 MG tablet Take 1 tablet (1 mg total) by mouth at bedtime as needed for anxiety. 05/31/20  Yes Tysinger, Kermit Balo, PA-C  DULoxetine (CYMBALTA) 30 MG capsule Take 1 capsule (30 mg total) by mouth 2 (two) times daily. 05/31/20  Yes Tysinger, Kermit Balo, PA-C  gabapentin (NEURONTIN) 600 MG tablet TAKE 1 TABLET(600 MG) BY MOUTH THREE TIMES DAILY 07/24/20  Yes Tysinger, Kermit Balo, PA-C  ibuprofen (ADVIL) 800 MG tablet Take 800 mg by mouth every 8 (eight) hours as needed.   Yes [provider]  morphine (MSIR) 15 MG tablet Take 1 tablet (15 mg total) by mouth 2 (two) times daily. 05/06/22  Yes   oxyCODONE ER (XTAMPZA ER) 9 MG C12A Xtampza ER 9 mg capsule sprinkle   TAKE 1 CAPSULE BY MOUTH TWICE DAILY   Yes [provider]  tiZANidine (ZANAFLEX) 4 MG tablet Take 2-4 mg by mouth 4 (four) times daily as needed for muscle spasms. 05/06/22  Yes [provider]  alendronate (FOSAMAX) 70 MG tablet TAKE 1 TABLET(70 MG) BY MOUTH 1 TIME A WEEK Patient not taking: Reported on 05/20/2022 05/06/21   Tysinger, Kermit Balo, PA-C  Eluxadoline (VIBERZI) 75 MG TABS Take 1 tablet by mouth 2 (two) times daily. Patient not taking: Reported on 05/20/2022 04/27/19   Jac Canavan, PA-C  famotidine (PEPCID) 40 MG tablet TAKE 1 TABLET(40 MG) BY MOUTH DAILY Patient not taking: Reported on 05/20/2022 05/31/20   Tysinger, Kermit Balo, PA-C  linaclotide [facility]) 72 MCG capsule Take 1 capsule (72 mcg total) by mouth daily before breakfast. Patient not taking: Reported on 05/20/2022 05/31/20   Tysinger, Kermit Balo, PA-C  oxyCODONE-acetaminophen (PERCOCET) 10-325 MG tablet Take 1 tablet by mouth every 6 (six) hours as needed. Patient not taking: Reported on 05/20/2022 05/06/22     oxyCODONE-acetaminophen (PERCOCET/ROXICET) 5-325 MG tablet oxycodone-acetaminophen 5 mg-325 mg tablet  TAKE 1 TABLET BY MOUTH EVERY 6 HOURS AS NEEDED Patient not taking: Reported on 05/20/2022    [provider]  traZODone (DESYREL) 50 MG tablet TAKE 1 TABLET(50 MG) BY MOUTH AT BEDTIME Patient not taking: Reported on 05/20/2022 06/27/20   Ronnald Nian, MD  Vitamin D,  Ergocalciferol, (DRISDOL) 1.25 MG (50000 UNIT) CAPS capsule Take 1 capsule (50,000 Units total) by mouth once a week. Patient not taking: Reported on 05/20/2022 05/31/20   Tysinger, Kermit Balo, PA-C      Allergies    Bee venom, Sulfa antibiotics, and Latex    Review of Systems   Review of Systems  Constitutional:  Negative for chills and fever.  Respiratory:  Negative for shortness of breath.   Cardiovascular:  Negative for chest pain.  Gastrointestinal:  Negative for abdominal pain.  Genitourinary:  Positive for difficulty urinating and  dysuria. Negative for vaginal bleeding, vaginal discharge and vaginal pain.  Neurological:  Negative for headaches.    Physical Exam Updated Vital Signs BP 106/60   Pulse 74   Temp 97.7 F (36.5 C) (Oral)   Resp 16   Ht 5\' 4"  (1.626 m)   Wt 61.7 kg   SpO2 96%   BMI 23.34 kg/m  Physical Exam Vitals and nursing note reviewed.  Constitutional:      General: She is not in acute distress.    Appearance: She is not ill-appearing.  HENT:     Head: Normocephalic and atraumatic.     Nose: No congestion.  Eyes:     Conjunctiva/sclera: Conjunctivae normal.  Cardiovascular:     Rate and Rhythm: Normal rate and regular rhythm.     Pulses: Normal pulses.     Heart sounds: No murmur heard.    No friction rub. No gallop.  Pulmonary:     Effort: No respiratory distress.     Breath sounds: No wheezing, rhonchi or rales.  Abdominal:     Palpations: Abdomen is soft.     Tenderness: There is no abdominal tenderness. There is no right CVA tenderness or left CVA tenderness.     Comments: Abdomen soft nontender, nondistended, no guarding rebound tenderness or peritoneal sign, she had noted flank tenderness bilaterally, without CVA tenderness.  Skin:    General: Skin is warm and dry.  Neurological:     Mental Status: She is alert.  Psychiatric:        Mood and Affect: Mood normal.     ED Results / Procedures / Treatments   Labs (all labs ordered are listed, but only abnormal results are displayed) Labs Reviewed  CBC WITH DIFFERENTIAL/PLATELET - Abnormal; Notable for the following components:      Result Value   WBC 13.4 (*)    Neutro Abs 11.0 (*)    Monocytes Absolute 1.4 (*)    All other components within normal limits  BASIC METABOLIC PANEL - Abnormal; Notable for the following components:   Potassium 2.9 (*)    Glucose, Bld 111 (*)    Calcium 8.6 (*)    All other components within normal limits  URINALYSIS, ROUTINE W REFLEX MICROSCOPIC - Abnormal; Notable for the following  components:   Leukocytes,Ua LARGE (*)    Bacteria, UA RARE (*)    Non Squamous Epithelial 0-5 (*)    All other components within normal limits  CULTURE, BLOOD (ROUTINE X 2)  CULTURE, BLOOD (ROUTINE X 2)  URINE CULTURE  MAGNESIUM    EKG None  Radiology CT ABDOMEN PELVIS W CONTRAST  Result Date: 05/20/2022 CLINICAL DATA:  Fevers, headache, neck stiffness. Diagnosed with a UTI. EXAM: CT ABDOMEN AND PELVIS WITH CONTRAST TECHNIQUE: Multidetector CT imaging of the abdomen and pelvis was performed using the standard protocol following bolus administration of intravenous contrast. RADIATION DOSE REDUCTION: This exam was performed according  to the departmental dose-optimization program which includes automated exposure control, adjustment of the mA and/or kV according to patient size and/or use of iterative reconstruction technique. CONTRAST:  OMNIPAQUE IOHEXOL 300 MG/ML  SOLN COMPARISON:  CT abdomen/pelvis 05/26/2013 FINDINGS: Lower chest: There is a trace left pleural effusion. The lung bases are otherwise clear. Imaged heart is unremarkable. Hepatobiliary: The liver is unremarkable. The gallbladder is surgically absent. Prominence of the common bile duct and central intrahepatic bile ducts is likely due to the history of cholecystectomy. Pancreas: Unremarkable. Spleen: Unremarkable. Adrenals/Urinary Tract: Adrenals are unremarkable. There wedge-shaped areas of hypoenhancement in the bilateral kidneys, left more than right and more conspicuous on the delayed images (7-11, 7-23). Findings are suspicious for pyelonephritis. There is possible developing phlegmonous change on the left (2-15, 2-27), but no organized abscess. There are no stones. There is no hydronephrosis or hydroureter. The bladder is unremarkable. Stomach/Bowel: The stomach is unremarkable. There is no evidence of bowel obstruction. There is no abnormal bowel wall thickening or inflammatory change. The appendix is normal.  Vascular/Lymphatic: There is scattered mild calcified atherosclerotic plaque in the nonaneurysmal abdominal aorta. The major branch vessels are patent. The main portal and splenic veins are patent. There is no abdominal or pelvic lymphadenopathy. Reproductive: The uterus is surgically absent. There is no adnexal mass. Other: There is trace free fluid in the pelvis. There is no free intraperitoneal air. Musculoskeletal: There is no acute osseous abnormality or suspicious osseous lesion. Postsurgical changes reflecting anterior fusion at L5-S1 are noted. IMPRESSION: 1. Wedge-shaped areas of hypoenhancement in both kidneys is suspicious for pyelonephritis with two areas of possible developing phlegmonous change on the left but no organized abscess. Correlate with symptoms and lab values. 2. Trace left pleural effusion. Electronically Signed   By: Lesia Hausen M.D.   On: 05/20/2022 12:06    Procedures Procedures    Medications Ordered in ED Medications  ceFEPIme (MAXIPIME) 2 g in sodium chloride 0.9 % 100 mL IVPB (has no administration in time range)  potassium chloride SA (KLOR-CON M) CR tablet 40 mEq (has no administration in time range)  magnesium sulfate IVPB 2 g 50 mL (has no administration in time range)  0.9 %  sodium chloride infusion (has no administration in time range)  potassium chloride SA (KLOR-CON M) CR tablet 40 mEq (40 mEq Oral Given 05/20/22 1149)  potassium chloride 10 mEq in 100 mL IVPB (0 mEq Intravenous Stopped 05/20/22 1252)  iohexol (OMNIPAQUE) 300 MG/ML solution 100 mL (100 mLs Intravenous Contrast Given 05/20/22 1135)    ED Course/ Medical Decision Making/ A&P                           Medical Decision Making Amount and/or Complexity of Data Reviewed Labs: ordered. Radiology: ordered.  Risk Prescription drug management. Decision regarding hospitalization.   This patient presents to the ED for concern of fevers, this involves an extensive number of treatment options, and  is a complaint that carries with it a high risk of complications and morbidity.  The differential diagnosis includes sepsis, UTI, septic stone, pyelo-, intra-abdominal abscess    Additional history obtained:  Additional history obtained from husband bedside External records from outside source obtained and reviewed including previous  urgent care note   Co morbidities that complicate the patient evaluation  N/A  Social Determinants of Health:  N/A    Lab Tests:  I Ordered, and personally interpreted labs.  The pertinent results  include: CBC shows leukocytosis of 13.4, BMP shows potassium 2.9 glucose 101 calcium 8.6, magnesium 2.1, UA shows large leukocytes no red blood cells white blood cells rare bacteria   Imaging Studies ordered:  I ordered imaging studies including CT abdomen pelvis I independently visualized and interpreted imaging which showed bilateral pyelonephrosis I agree with the radiologist interpretation   Cardiac Monitoring:  The patient was maintained on a cardiac monitor.  I personally viewed and interpreted the cardiac monitored which showed an underlying rhythm of: N/A   Medicines ordered and prescription drug management:  I ordered medication including antibiotics I have reviewed the patients home medicines and have made adjustments as needed  Critical Interventions:  N/A   Reevaluation:  Presents with fevers, as well as urinary symptoms, I am concerned for possible Pilo versus septic stone versus intrarenal abscess will obtain lab work, imaging and reassess.  Imaging reveals bilateral pyelonephrosis the formation of an abscess, since patient's been on antibiotics ciprofloxacin for last 2 days and is continue have dysuria and high fever would recommend admission for IV antibiotics.  Patient agreed this plan will consult hospitalist team.  Consultations Obtained:  I requested consultation with the hospitalist Dr. Gwenlyn Perking,  and discussed lab and  imaging findings as well as pertinent plan - they recommend: will admit the patient   Test Considered:  N/A    Rule out  Low suspicion for ruptured stomach ulcer as she has no peritoneal sign present on exam.  Low suspicion for bowel obstruction as abdomen is nondistended normal bowel sounds, so passing gas and having normal bowel movements.  Low suspicion for complicated diverticulitis CT imaging is negative for this..  Low suspicion for septic stone CT imaging is also negative for this.   Dispostion and problem list  After consideration of the diagnostic results and the patients response to treatment, I feel that the patent would benefit from admission.  Pyelonephrosis-patient started on antibiotics, will need further observation Hypokalemia-unclear etiology, patient was given potassium as well as IV potassium will need further monitoring.            Final Clinical Impression(s) / ED Diagnoses Final diagnoses:  Pyelonephritis    Rx / DC Orders ED Discharge Orders     None         Carroll Sage, PA-C 05/20/22 1401    Bethann Berkshire, MD 05/20/22 1720

## 2022-05-20 NOTE — Assessment & Plan Note (Addendum)
-   Patient with fever, nausea and vomiting prior to admission. -Also reporting increased frequency and dysuria. -Patient failed outpatient oral antibiotic therapy prior to admission. -At time of discharge no further dysuria reported currently. -Reports feeling better overall and has been able to tolerate diet without difficulties. -No growth appreciated on urine culture; most likely secondary to antibiotic usage in the outpatient setting prior to cultures being collected. -Due to response to patient's cefepime while in-house will discharge with 7 more days of cefdinir twice a day to complete treatment. -Continue as needed antiemetics and analgesics. -Continue to maintain adequate hydration.

## 2022-05-20 NOTE — Assessment & Plan Note (Addendum)
-  Patient with chronic back pain; actively followed by pain clinic as an outpatient. -Will continue the use of as needed oxycodone and every 12 hours extended release oxycodone. -Patient to continue outpatient follow-up with pain clinic management. -Will also continue as needed Zanaflex and 3 times a day's Neurontin.

## 2022-05-20 NOTE — ED Notes (Signed)
Receiving nurse, brooke, states she would call this nurse back in 15 minutes for report

## 2022-05-20 NOTE — Assessment & Plan Note (Signed)
-  Continue PPI. -Patient reports no epigastric discomfort currently.

## 2022-05-20 NOTE — Assessment & Plan Note (Signed)
-  Stable mood overall -No suicidal ideation or hallucination. -Continue as needed Xanax and a scheduled dose of Cymbalta.

## 2022-05-21 DIAGNOSIS — N12 Tubulo-interstitial nephritis, not specified as acute or chronic: Secondary | ICD-10-CM | POA: Diagnosis not present

## 2022-05-21 DIAGNOSIS — K219 Gastro-esophageal reflux disease without esophagitis: Secondary | ICD-10-CM | POA: Diagnosis not present

## 2022-05-21 DIAGNOSIS — E876 Hypokalemia: Secondary | ICD-10-CM

## 2022-05-21 DIAGNOSIS — G894 Chronic pain syndrome: Secondary | ICD-10-CM | POA: Diagnosis not present

## 2022-05-21 DIAGNOSIS — F418 Other specified anxiety disorders: Secondary | ICD-10-CM | POA: Diagnosis not present

## 2022-05-21 LAB — CBC
HCT: 35.5 % — ABNORMAL LOW (ref 36.0–46.0)
Hemoglobin: 11.5 g/dL — ABNORMAL LOW (ref 12.0–15.0)
MCH: 31.2 pg (ref 26.0–34.0)
MCHC: 32.4 g/dL (ref 30.0–36.0)
MCV: 96.2 fL (ref 80.0–100.0)
Platelets: 360 10*3/uL (ref 150–400)
RBC: 3.69 MIL/uL — ABNORMAL LOW (ref 3.87–5.11)
RDW: 13.3 % (ref 11.5–15.5)
WBC: 8.1 10*3/uL (ref 4.0–10.5)
nRBC: 0 % (ref 0.0–0.2)

## 2022-05-21 LAB — URINE CULTURE: Culture: NO GROWTH

## 2022-05-21 LAB — COMPREHENSIVE METABOLIC PANEL
ALT: 12 U/L (ref 0–44)
AST: 11 U/L — ABNORMAL LOW (ref 15–41)
Albumin: 2.7 g/dL — ABNORMAL LOW (ref 3.5–5.0)
Alkaline Phosphatase: 97 U/L (ref 38–126)
Anion gap: 5 (ref 5–15)
BUN: 7 mg/dL (ref 6–20)
CO2: 26 mmol/L (ref 22–32)
Calcium: 8.4 mg/dL — ABNORMAL LOW (ref 8.9–10.3)
Chloride: 106 mmol/L (ref 98–111)
Creatinine, Ser: 0.58 mg/dL (ref 0.44–1.00)
GFR, Estimated: >60 mL/min (ref >60)
Glucose, Bld: 102 mg/dL — ABNORMAL HIGH (ref 70–99)
Potassium: 3.7 mmol/L (ref 3.5–5.1)
Sodium: 137 mmol/L (ref 135–145)
Total Bilirubin: 0.4 mg/dL (ref 0.3–1.2)
Total Protein: 5.8 g/dL — ABNORMAL LOW (ref 6.5–8.1)

## 2022-05-21 LAB — HIV ANTIBODY (ROUTINE TESTING W REFLEX): HIV Screen 4th Generation wRfx: NONREACTIVE

## 2022-05-21 NOTE — Progress Notes (Signed)
  Transition of Care (TOC) Screening Note   Patient Details  Name: KADESHIA KASPARIAN Date of Birth: [birth year]   Transition of Care [facility]) CM/SW Contact:    Annice Needy, LCSW Phone Number: 05/21/2022, 9:28 AM    Transition of Care Department [facility]) has reviewed patient and no TOC needs have been identified at this time. We will continue to monitor patient advancement through interdisciplinary progression rounds. If new patient transition needs arise, please place a TOC consult.

## 2022-05-21 NOTE — Progress Notes (Signed)
Initial Nutrition Assessment  DOCUMENTATION CODES:      INTERVENTION:  Regular diet  Snack daily  NUTRITION DIAGNOSIS:   Inadequate oral intake related to acute illness (UTI) as evidenced by per patient/family report.  GOAL:  Patient will meet greater than or equal to 90% of their needs  MONITOR:  PO intake, Labs  REASON FOR ASSESSMENT:   Malnutrition Screening Tool    ASSESSMENT: Patient is a 56 yo female with depression, anxiety, Vitamin D deficiency, GERD. Presents with fever, flank pain- UTI after failing outpatient therapy.  Patient ate 75 % of dinner last night per chart. She didn't eat breakfast this morning but lunch is here and she is feeling hungry. Patient is up dressed and walking around in room. She feeling much better today.   Weight stable between 66-68 kg x 2 years. Currently 66.1 kg. Patient reports weight fluctuates 126-137 lb.   Medications: oxycodone, Protonix and gabapentin and IV-antibiotic. IVF-NS@100  ml/hr.      Latest Ref Rng & Units 05/21/2022    4:49 AM 05/20/2022   11:00 AM 05/31/2020   12:15 PM  BMP  Glucose 70 - 99 mg/dL 630  160  83   BUN 6 - 20 mg/dL 7  11  6    Creatinine 0.44 - 1.00 mg/dL  1.09  3.23   BUN/Creat Ratio 9 - 23   8   Sodium 135 - 145 mmol/L 137  136  140   Potassium 3.5 - 5.1 mmol/L 3.7  2.9  4.5   Chloride 98 - 111 mmol/L 106  100  97   CO2 22 - 32 mmol/L 26  26  24    Calcium 8.9 - 10.3 mg/dL 8.4  8.6  5.57       NUTRITION - FOCUSED PHYSICAL EXAM: Unable to complete Nutrition-Focused physical exam at this time.     Diet Order:   Diet Order             Diet regular Room service appropriate? Yes; Fluid consistency: Thin  Diet effective now                   EDUCATION NEEDS:  No education needs have been identified at this time  Skin:  Skin Assessment: Reviewed RN Assessment  Last BM:  7/5  Height:   Ht Readings from Last 1 Encounters:  05/20/22 5\' 4"  (1.626 m)    Weight:   Wt Readings  from Last 1 Encounters:  05/20/22 66.1 kg    Ideal Body Weight:   55 kg  BMI:  Body mass index is 25.01 kg/m.  Estimated Nutritional Needs:   Kcal:  1700 -1800  Protein:  79-85 gr  Fluid:  1.7-1.8 liters daily  07/21/22 MS,RD,CSG,LDN Contact: 

## 2022-05-21 NOTE — Progress Notes (Signed)
Progress Note   Patient: Shelia Snyder TDS:[id number] DOB: 06/08/1966 DOA: 05/20/2022     1 DOS: the patient was seen and examined on 05/21/2022   Brief hospital course: Shelia Snyder is a 56 y.o. female with medical history significant of depression with anxiety, chronic back pain (actively on chronic pain management), vitamin D deficiency/osteoporosis history and gastroesophageal reflux disease; who presented to the emergency department with complaints of nausea, vomiting, high-grade fevers, dysuria, increased frequency and flank pain.  Patient reports symptom has been present for the last 1-2 weeks.  There has not been any blood content in her emesis and expressed high-grade temperature at home 103-104 Fahrenheit.  Patient has visited urgent care/PCP with diagnosis of UTI and failure to outpatient therapy after receiving doxycycline and 48 hours of Cipro. Patient reports flank pain bilaterally is new and distinguishable from her chronic back discomfort for which she is followed at the pain clinic. No chest pain, no focal weaknesses, no sick contacts, no abdominal pain, no shortness of breath, productive coughing spells or any other complaints. In the ED CT scan abdomen and pelvis demonstrating positive changes of pyelonephritis bilaterally (left more than right) without the presence of organized abscesses. Cultures taken, antibiotics started and TRH consulted to place patient in the hospital for further evaluation and management.  Assessment and Plan: * UTI (urinary tract infection) - Patient with fever, nausea and vomiting prior to admission. -Also reporting increased frequency and dysuria. -Patient failed outpatient oral antibiotic therapy prior to admission. -No further dysuria reported currently. -Reports feeling better overall. -Continue on cefepime and Follow culture results -Continue as needed antiemetics and analgesics. -Renal function stable -Hopefully home in the next 24  hours.  Depression with anxiety -Stable mood overall -No suicidal ideation or hallucination. -Continue as needed Xanax and a scheduled dose of Cymbalta.   Chronic pain syndrome -Patient with chronic back pain; actively followed by pain clinic as an outpatient. -Will continue the use of as needed oxycodone and every 12 hours extended release oxycodone equivalent (at home patient uses Xtampza 9 mg every 12 hours). -Will continue as needed Zanaflex and 3 times a day's Neurontin. -Patient reports that overall pain management is working adequately.  GERD (gastroesophageal reflux disease) -Continue PPI. -Patient reports no epigastric discomfort currently.  Hypokalemia - In the setting of GI losses and decreased oral intake -Improve and currently within normal limits -Continue to follow electrolytes trend. -Potassium 3.7.    Subjective:  Feeling better, no further episode of nausea or vomiting.  Patient denies chest pain, shortness of breath, appetite is improving.  Still with frequency but denies dysuria.  So far no fever appreciated.  Physical Exam: Vitals:   05/20/22 2001 05/20/22 2354 05/21/22 0410 05/21/22 1400  BP: 118/65 103/60 118/62 (!) 113/57  Pulse: (!) 102 81 84 78  Resp: 16 16 16 18   Temp: 98.1 F (36.7 C) 98.3 F (36.8 C) 98.1 F (36.7 C) 98 F (36.7 C)  TempSrc: Oral Oral Oral Oral  SpO2: 99% 99% 99% 95%  Weight:      Height:       General exam: Alert, awake, oriented x 3; feeling better, reporting no nausea, vomiting or chest pain.  Still having frequency. Respiratory system: Clear to auscultation. Respiratory effort normal.  Good saturation on room air.  No using accessory muscles. Cardiovascular system:RRR. No murmurs, rubs, gallops.  No JVD. Gastrointestinal system: Abdomen is nondistended, soft and nontender. No organomegaly or masses felt. Normal bowel sounds heard. Central nervous  system: Alert and oriented. No focal neurological deficits. Extremities:  No cyanosis, clubbing or edema. Skin: No rashes, no petechiae. Psychiatry: Judgement and insight appear normal. Mood & affect appropriate.   Data Reviewed: CBC: WBCs 8.1, hemoglobin 11.5, platelet count 360 K Comprehensive metabolic panel: Sodium 137, potassium 3.7, chloride 106, bicarb 26, BUN 7, creatinine 0.58, albumin 2.7, normal rest of her LFTs.  Family Communication: No family at bedside.  Disposition: Status is: Inpatient Remains inpatient appropriate because: Still receiving IV antibiotics awaiting culture results from sepsis due to UTI.   Planned Discharge Destination: Home   Author: Vassie Loll, MD 05/21/2022 5:12 PM  For on call review [email or web address].

## 2022-05-21 NOTE — Assessment & Plan Note (Addendum)
-  In the setting of GI losses and decreased oral intake -Improve and currently within normal limits -Repeat basic metabolic panel at follow-up visit to reassess electrolytes trend/stability. -Potassium within normal limits at discharge.

## 2022-05-22 DIAGNOSIS — E876 Hypokalemia: Secondary | ICD-10-CM | POA: Diagnosis not present

## 2022-05-22 DIAGNOSIS — N12 Tubulo-interstitial nephritis, not specified as acute or chronic: Secondary | ICD-10-CM | POA: Diagnosis not present

## 2022-05-22 DIAGNOSIS — F418 Other specified anxiety disorders: Secondary | ICD-10-CM | POA: Diagnosis not present

## 2022-05-22 DIAGNOSIS — G894 Chronic pain syndrome: Secondary | ICD-10-CM | POA: Diagnosis not present

## 2022-05-22 DIAGNOSIS — R69 Illness, unspecified: Secondary | ICD-10-CM | POA: Diagnosis not present

## 2022-05-22 DIAGNOSIS — K219 Gastro-esophageal reflux disease without esophagitis: Secondary | ICD-10-CM | POA: Diagnosis not present

## 2022-05-22 MED ORDER — TIZANIDINE HCL 4 MG PO TABS
2.0000 mg | ORAL_TABLET | Freq: Three times a day (TID) | ORAL | Status: AC | PRN
Start: 2022-05-22 — End: ?

## 2022-05-22 MED ORDER — CEFDINIR 300 MG PO CAPS
300.0000 mg | ORAL_CAPSULE | Freq: Two times a day (BID) | ORAL | 0 refills | Status: AC
Start: 2022-05-22 — End: 2022-05-29

## 2022-05-22 NOTE — Discharge Summary (Signed)
Physician Discharge Summary   Patient: Shelia Snyder MRN: [id number] DOB: 1966/05/07  Admit date:     05/20/2022  Discharge date: 05/22/22  Discharge Physician: Barton Dubois   PCP: Simona Huh, NP   Recommendations at discharge:  Free bed basic metabolic panel to follow electrolytes and renal function Assure complete resolution of patient's symptoms after completed antibiotics. If needed assist patient to follow-up with urology service as an outpatient.  Discharge Diagnoses: Principal Problem:   UTI (urinary tract infection) Active Problems:   Hypokalemia   GERD (gastroesophageal reflux disease)   Chronic pain syndrome   Depression with anxiety   Pyelonephritis  Hospital Course: Shelia Snyder is a 56 y.o. female with medical history significant of depression with anxiety, chronic back pain (actively on chronic pain management), vitamin D deficiency/osteoporosis history and gastroesophageal reflux disease; who presented to the emergency department with complaints of nausea, vomiting, high-grade fevers, dysuria, increased frequency and flank pain.  Patient reports symptom has been present for the last 1-2 weeks.  There has not been any blood content in her emesis and expressed high-grade temperature at home 103-104 Fahrenheit.  Patient has visited urgent care/PCP with diagnosis of UTI and failure to outpatient therapy after receiving doxycycline and 48 hours of Cipro. Patient reports flank pain bilaterally is new and distinguishable from her chronic back discomfort for which she is followed at the pain clinic. No chest pain, no focal weaknesses, no sick contacts, no abdominal pain, no shortness of breath, productive coughing spells or any other complaints. In the ED CT scan abdomen and pelvis demonstrating positive changes of pyelonephritis bilaterally (left more than right) without the presence of organized abscesses. Cultures taken, antibiotics started and TRH consulted to place patient  in the hospital for further evaluation and management.  Assessment and Plan: * UTI (urinary tract infection) - Patient with fever, nausea and vomiting prior to admission. -Also reporting increased frequency and dysuria. -Patient failed outpatient oral antibiotic therapy prior to admission. -At time of discharge no further dysuria reported currently. -Reports feeling better overall and has been able to tolerate diet without difficulties. -No growth appreciated on urine culture; most likely secondary to antibiotic usage in the outpatient setting prior to cultures being collected. -Due to response to patient's cefepime while in-house will discharge with 7 more days of cefdinir twice a day to complete treatment. -Continue as needed antiemetics and analgesics. -Continue to maintain adequate hydration.   Depression with anxiety -Stable mood overall -No suicidal ideation or hallucination. -Continue as needed Xanax and a scheduled dose of Cymbalta.   Chronic pain syndrome -Patient with chronic back pain; actively followed by pain clinic as an outpatient. -Will continue the use of as needed oxycodone and every 12 hours extended release oxycodone. -Patient to continue outpatient follow-up with pain clinic management. -Will also continue as needed Zanaflex and 3 times a day's Neurontin.   GERD (gastroesophageal reflux disease) -Continue PPI. -Patient reports no epigastric discomfort currently.  Hypokalemia -In the setting of GI losses and decreased oral intake -Improve and currently within normal limits -Repeat basic metabolic panel at follow-up visit to reassess electrolytes trend/stability. -Potassium within normal limits at discharge.   Consultants: None Procedures performed: See below for x-ray reports. Disposition: Home Diet recommendation: Regular diet with instruction to maintain adequate hydration.  DISCHARGE MEDICATION: Allergies as of 05/22/2022       Reactions   Bee  Venom Anaphylaxis   Sulfa Antibiotics Anaphylaxis   Latex Rash  Medication List     STOP taking these medications    ibuprofen 800 MG tablet Commonly known as: ADVIL   morphine 15 MG tablet Commonly known as: MSIR   traZODone 50 MG tablet Commonly known as: DESYREL       TAKE these medications    alendronate 70 MG tablet Commonly known as: FOSAMAX TAKE 1 TABLET(70 MG) BY MOUTH 1 TIME A WEEK   ALPRAZolam 1 MG tablet Commonly known as: XANAX Take 1 tablet (1 mg total) by mouth at bedtime as needed for anxiety.   cefdinir 300 MG capsule Commonly known as: OMNICEF Take 1 capsule (300 mg total) by mouth 2 (two) times daily for 7 days.   DULoxetine 30 MG capsule Commonly known as: CYMBALTA Take 1 capsule (30 mg total) by mouth 2 (two) times daily.   famotidine 40 MG tablet Commonly known as: PEPCID TAKE 1 TABLET(40 MG) BY MOUTH DAILY   gabapentin 600 MG tablet Commonly known as: NEURONTIN TAKE 1 TABLET(600 MG) BY MOUTH THREE TIMES DAILY   linaclotide 72 MCG capsule Commonly known as: Linzess Take 1 capsule (72 mcg total) by mouth daily before breakfast.   oxyCODONE-acetaminophen 10-325 MG tablet Commonly known as: PERCOCET Take 1 tablet by mouth every 6 (six) hours as needed. What changed: Another medication with the same name was removed. Continue taking this medication, and follow the directions you see here.   tiZANidine 4 MG tablet Commonly known as: ZANAFLEX Take 0.5 tablets (2 mg total) by mouth every 8 (eight) hours as needed for muscle spasms. What changed:  how much to take when to take this   Viberzi 75 MG Tabs Generic drug: Eluxadoline Take 1 tablet by mouth 2 (two) times daily.   Vitamin D (Ergocalciferol) 1.25 MG (50000 UNIT) Caps capsule Commonly known as: DRISDOL Take 1 capsule (50,000 Units total) by mouth once a week.   Xtampza ER 9 MG C12a Generic drug: oxyCODONE ER Xtampza ER 9 mg capsule sprinkle  TAKE 1 CAPSULE BY MOUTH  TWICE DAILY        Follow-up Information     Courtney Paris, NP. Go today.   Specialty: Nurse Practitioner Why: Already scheduled follow-up appointment. Contact information: [address] [place] [address] [phone]                Discharge Exam: Filed Weights   05/20/22 1000 05/20/22 1618  Weight: 61.7 kg 66.1 kg   General exam: Alert, awake, oriented x 3; no fever, no nausea, no vomiting.  Feeling ready to go home.  Very little frequency and no dysuria reported. Respiratory system: Clear to auscultation. Respiratory effort normal.  Good saturation on room air; no using accessory muscles. Cardiovascular system:RRR. No murmurs, rubs, gallops. Gastrointestinal system: Abdomen is nondistended, soft and nontender. No organomegaly or masses felt. Normal bowel sounds heard. Central nervous system: Alert and oriented. No focal neurological deficits. Extremities: No cyanosis, clubbing or edema. Skin: No rashes, lesions or ulcers Psychiatry: Judgement and insight appear normal. Mood & affect appropriate.    Condition at discharge: Stable and improved.  The results of significant diagnostics from this hospitalization (including imaging, microbiology, ancillary and laboratory) are listed below for reference.   Imaging Studies: CT ABDOMEN PELVIS W CONTRAST  Result Date: 05/20/2022 CLINICAL DATA:  Fevers, headache, neck stiffness. Diagnosed with a UTI. EXAM: CT ABDOMEN AND PELVIS WITH CONTRAST TECHNIQUE: Multidetector CT imaging of the abdomen and pelvis was performed using the standard protocol following bolus administration of intravenous contrast.  RADIATION DOSE REDUCTION: This exam was performed according to the departmental dose-optimization program which includes automated exposure control, adjustment of the mA and/or kV according to patient size and/or use of iterative reconstruction technique. CONTRAST:  12mL OMNIPAQUE IOHEXOL 300 MG/ML  SOLN COMPARISON:  CT  abdomen/pelvis 05/26/2013 FINDINGS: Lower chest: There is a trace left pleural effusion. The lung bases are otherwise clear. Imaged heart is unremarkable. Hepatobiliary: The liver is unremarkable. The gallbladder is surgically absent. Prominence of the common bile duct and central intrahepatic bile ducts is likely due to the history of cholecystectomy. Pancreas: Unremarkable. Spleen: Unremarkable. Adrenals/Urinary Tract: Adrenals are unremarkable. There wedge-shaped areas of hypoenhancement in the bilateral kidneys, left more than right and more conspicuous on the delayed images (7-11, 7-23). Findings are suspicious for pyelonephritis. There is possible developing phlegmonous change on the left (2-15, 2-27), but no organized abscess. There are no stones. There is no hydronephrosis or hydroureter. The bladder is unremarkable. Stomach/Bowel: The stomach is unremarkable. There is no evidence of bowel obstruction. There is no abnormal bowel wall thickening or inflammatory change. The appendix is normal. Vascular/Lymphatic: There is scattered mild calcified atherosclerotic plaque in the nonaneurysmal abdominal aorta. The major branch vessels are patent. The main portal and splenic veins are patent. There is no abdominal or pelvic lymphadenopathy. Reproductive: The uterus is surgically absent. There is no adnexal mass. Other: There is trace free fluid in the pelvis. There is no free intraperitoneal air. Musculoskeletal: There is no acute osseous abnormality or suspicious osseous lesion. Postsurgical changes reflecting anterior fusion at L5-S1 are noted. IMPRESSION: 1. Wedge-shaped areas of hypoenhancement in both kidneys is suspicious for pyelonephritis with two areas of possible developing phlegmonous change on the left but no organized abscess. Correlate with symptoms and lab values. 2. Trace left pleural effusion. Electronically Signed   By: Valetta Mole M.D.   On: 05/20/2022 12:06    Microbiology: Results for  orders placed or performed during the hospital encounter of 05/20/22  Blood culture (routine x 2)     Status: None (Preliminary result)   Collection Time: 05/20/22  1:28 PM   Specimen: BLOOD LEFT ARM  Result Value Ref Range Status   Specimen Description BLOOD LEFT ARM  Final   Special Requests   Final    BOTTLES DRAWN AEROBIC AND ANAEROBIC Blood Culture results may not be optimal due to an excessive volume of blood received in culture bottles   Culture   Final    NO GROWTH 2 DAYS Performed at [facility], [address] [place], [place] [address]    Report Status PENDING  Incomplete  Blood culture (routine x 2)     Status: None (Preliminary result)   Collection Time: 05/20/22  1:28 PM   Specimen: BLOOD RIGHT HAND  Result Value Ref Range Status   Specimen Description BLOOD RIGHT HAND  Final   Special Requests   Final    BOTTLES DRAWN AEROBIC AND ANAEROBIC Blood Culture adequate volume   Culture   Final    NO GROWTH 2 DAYS Performed at [facility], [address], [place], [place] [address]    Report Status PENDING  Incomplete  Urine Culture     Status: None   Collection Time: 05/20/22  1:43 PM   Specimen: Urine, Clean Catch  Result Value Ref Range Status   Specimen Description   Final    URINE, CLEAN CATCH Performed at [facility], [address], [place],  [address]    Special Requests  Final    NONE Performed at [facility], [address], [place], [place] [address]    Culture   Final    NO GROWTH Performed at [facility], [address], [place], [place] [address]    Report Status 05/21/2022 FINAL  Final    Labs: CBC: Recent Labs  Lab 05/20/22 1100 05/21/22 0449  WBC 13.4* 8.1  NEUTROABS 11.0*  --   HGB 12.8 11.5*  HCT 37.6 35.5*  MCV 92.4 96.2  PLT 359 360   Basic Metabolic Panel: Recent Labs  Lab 05/20/22 1100 05/21/22 0449  NA 136 137  K 2.9* 3.7  CL 100 106  CO2 26 26  GLUCOSE 111* 102*  BUN 11 7  CREATININE 0.67 0.58   CALCIUM 8.6* 8.4*  MG 2.1  --    Liver Function Tests: Recent Labs  Lab 05/21/22 0449  AST 11*  ALT 12  ALKPHOS 97  BILITOT 0.4  PROT 5.8*  ALBUMIN 2.7*   CBG: No results for input(s): "GLUCAP" in the last 168 hours.  Discharge time spent: greater than 30 minutes.  Signed: Vassie Loll, MD [facility] 05/22/2022

## 2022-05-25 LAB — CULTURE, BLOOD (ROUTINE X 2)
Culture: NO GROWTH
Culture: NO GROWTH
Special Requests: ADEQUATE

## 2022-05-29 ENCOUNTER — Ambulatory Visit: Payer: 59 | Admitting: Urology

## 2022-05-29 VITALS — BP 129/71 | HR 87 | Ht 64.0 in | Wt 145.0 lb

## 2022-05-29 DIAGNOSIS — N151 Renal and perinephric abscess: Secondary | ICD-10-CM

## 2022-05-29 DIAGNOSIS — N12 Tubulo-interstitial nephritis, not specified as acute or chronic: Secondary | ICD-10-CM | POA: Diagnosis not present

## 2022-05-29 LAB — URINALYSIS, ROUTINE W REFLEX MICROSCOPIC
Bilirubin, UA: NEGATIVE
Glucose, UA: NEGATIVE
Ketones, UA: NEGATIVE
Leukocytes,UA: NEGATIVE
Nitrite, UA: NEGATIVE
Protein,UA: NEGATIVE
RBC, UA: NEGATIVE
Specific Gravity, UA: <1.005 — ABNORMAL LOW (ref 1.005–1.030)
Urobilinogen, Ur: 0.2 mg/dL (ref 0.2–1.0)
pH, UA: 5.5 (ref 5.0–7.5)

## 2022-05-29 NOTE — Progress Notes (Signed)
05/29/2022 9:24 AM   Claudette Laws 05-05-1966 [id number]  Referring provider: Courtney Paris, NP [address] [place],  [place] [address]  pyelonephritis   HPI: Ms Lanter is a 56yo here for evaluation of pyelonephritis. She was admitted 7/5 with bilateral pyelonephritis. She has been having a fever of 102-104 for 3 weeks. She is completing omnicef course today. Last night was the first night she did not have a high fever. She denies any urinary frequency, urgency or dysuria. No other complaints today.    PMH: Past Medical History:  Diagnosis Date   Anxiety    Chronic low back pain    Degeneration of lumbar intervertebral disc    Degenerative spondylolisthesis    Depression    Diarrhea    Elevated liver enzymes    IBS (irritable bowel syndrome) 09/04/2011   Left leg pain    related to L4-L5 ruptured disc   Osteoporosis     Surgical History: Past Surgical History:  Procedure Laterality Date   ABDOMINAL EXPOSURE N/A 11/08/2019   Procedure: ABDOMINAL EXPOSURE;  Surgeon: Larina Earthly, MD;  Location: [facility] OR;  Service: Vascular;  Laterality: N/A;   ANTERIOR LUMBAR FUSION N/A 11/08/2019   Procedure: ANTERIOR LUMBAR FUSION LUMBAR FIVE-SACRAL ONE;  Surgeon: Venita Lick, MD;  Location: [facility] OR;  Service: Orthopedics;  Laterality: N/A;   BACK SURGERY  2000   L4-L5 discectomy   BREAST LUMPECTOMY     benign, right   CHOLECYSTECTOMY     COLONOSCOPY  09/05/2011   Procedure: COLONOSCOPY;  Surgeon: Malissa Hippo, MD;  Location: [facility]ITE;  Service: Endoscopy;  Laterality: N/A;   COLONOSCOPY  2016   melanosis coli, Dr. Jones Skene   OOPHORECTOMY     right, abnormal bleeding after hysterectomy   TOTAL ABDOMINAL HYSTERECTOMY     has 1 ovary, hx/o heavy bleeding, s/p ablation    Home Medications:  Allergies as of 05/29/2022       Reactions   Bee Venom Anaphylaxis   Sulfa Antibiotics Anaphylaxis   Latex Rash        Medication List        Accurate as of  May 29, 2022  9:24 AM. If you have any questions, ask your nurse or doctor.          alendronate 70 MG tablet Commonly known as: FOSAMAX TAKE 1 TABLET(70 MG) BY MOUTH 1 TIME A WEEK   ALPRAZolam 1 MG tablet Commonly known as: XANAX Take 1 tablet (1 mg total) by mouth at bedtime as needed for anxiety.   cefdinir 300 MG capsule Commonly known as: OMNICEF Take 1 capsule (300 mg total) by mouth 2 (two) times daily for 7 days.   DULoxetine 30 MG capsule Commonly known as: CYMBALTA Take 1 capsule (30 mg total) by mouth 2 (two) times daily.   famotidine 40 MG tablet Commonly known as: PEPCID TAKE 1 TABLET(40 MG) BY MOUTH DAILY   gabapentin 600 MG tablet Commonly known as: NEURONTIN TAKE 1 TABLET(600 MG) BY MOUTH THREE TIMES DAILY   linaclotide 72 MCG capsule Commonly known as: Linzess Take 1 capsule (72 mcg total) by mouth daily before breakfast.   oxyCODONE-acetaminophen 10-325 MG tablet Commonly known as: PERCOCET Take 1 tablet by mouth every 6 (six) hours as needed.   tiZANidine 4 MG tablet Commonly known as: ZANAFLEX Take 0.5 tablets (2 mg total) by mouth every 8 (eight) hours as needed for muscle spasms.   Viberzi 75 MG Tabs Generic drug:  Eluxadoline Take 1 tablet by mouth 2 (two) times daily.   Vitamin D (Ergocalciferol) 1.25 MG (50000 UNIT) Caps capsule Commonly known as: DRISDOL Take 1 capsule (50,000 Units total) by mouth once a week.   Xtampza ER 9 MG C12a Generic drug: oxyCODONE ER Xtampza ER 9 mg capsule sprinkle  TAKE 1 CAPSULE BY MOUTH TWICE DAILY        Allergies:  Allergies  Allergen Reactions   Bee Venom Anaphylaxis   Sulfa Antibiotics Anaphylaxis   Latex Rash    Family History: Family History  Problem Relation Age of Onset   Heart attack Mother 67       MI   Aneurysm Mother        abdominal, smoker   Hypertension Mother    Heart disease Mother 34   COPD Father    Diabetes Maternal Uncle    Cancer Maternal Grandmother         colon   Cancer Paternal Grandmother        breast   Stroke Neg Hx     Social History:  reports that she has quit smoking. She has a 19.00 pack-year smoking history. She has never used smokeless tobacco. She reports that she does not drink alcohol and does not use drugs.  ROS: All other review of systems were reviewed and are negative except what is noted above in HPI  Physical Exam: BP 129/71   Pulse 87   Ht 5\' 4"  (1.626 m)   Wt 145 lb (65.8 kg)   BMI 24.89 kg/m   Constitutional:  Alert and oriented, No acute distress. HEENT: [place] AT, moist mucus membranes.  Trachea midline, no masses. Cardiovascular: No clubbing, cyanosis, or edema. Respiratory: Normal respiratory effort, no increased work of breathing. GI: Abdomen is soft, nontender, nondistended, no abdominal masses GU: No CVA tenderness.  Lymph: No cervical or inguinal lymphadenopathy. Skin: No rashes, bruises or suspicious lesions. Neurologic: Grossly intact, no focal deficits, moving all 4 extremities. Psychiatric: Normal mood and affect.  Laboratory Data: Lab Results  Component Value Date   WBC 8.1 05/21/2022   HGB 11.5 (L) 05/21/2022   HCT 35.5 (L) 05/21/2022   MCV 96.2 05/21/2022   PLT 360 05/21/2022    Lab Results  Component Value Date   CREATININE 0.58 05/21/2022    No results found for: "PSA"  No results found for: "TESTOSTERONE"  No results found for: "HGBA1C"  Urinalysis    Component Value Date/Time   COLORURINE YELLOW 05/20/2022 1025   APPEARANCEUR CLEAR 05/20/2022 1025   LABSPEC 1.006 05/20/2022 1025   LABSPEC 1.015 04/25/2020 1434   PHURINE 6.0 05/20/2022 1025   GLUCOSEU NEGATIVE 05/20/2022 1025   HGBUR NEGATIVE 05/20/2022 1025   BILIRUBINUR NEGATIVE 05/20/2022 1025   BILIRUBINUR negative 04/25/2020 1434   KETONESUR NEGATIVE 05/20/2022 1025   PROTEINUR NEGATIVE 05/20/2022 1025   UROBILINOGEN 0.2 05/26/2013 0145   NITRITE NEGATIVE 05/20/2022 1025   LEUKOCYTESUR LARGE (A) 05/20/2022 1025     Lab Results  Component Value Date   BACTERIA RARE (A) 05/20/2022    Pertinent Imaging: CT 05/20/2022: Images reviewed and discussed with the patient  No results found for this or any previous visit.  Results for orders placed during the hospital encounter of 11/20/19  01/18/20 Venous Img Lower Bilateral (DVT)  Narrative CLINICAL DATA:  56 year old female with pain in the right leg  EXAM: BILATERAL LOWER EXTREMITY VENOUS DOPPLER ULTRASOUND  TECHNIQUE: Gray-scale sonography with graded compression, as well as color Doppler and  duplex ultrasound were performed to evaluate the lower extremity deep venous systems from the level of the common femoral vein and including the common femoral, femoral, profunda femoral, popliteal and calf veins including the posterior tibial, peroneal and gastrocnemius veins when visible. The superficial great saphenous vein was also interrogated. Spectral Doppler was utilized to evaluate flow at rest and with distal augmentation maneuvers in the common femoral, femoral and popliteal veins.  COMPARISON:  None.  FINDINGS: RIGHT LOWER EXTREMITY  Common Femoral Vein: No evidence of thrombus. Normal compressibility, respiratory phasicity and response to augmentation.  Saphenofemoral Junction: No evidence of thrombus. Normal compressibility and flow on color Doppler imaging.  Profunda Femoral Vein: No evidence of thrombus. Normal compressibility and flow on color Doppler imaging.  Femoral Vein: No evidence of thrombus. Normal compressibility, respiratory phasicity and response to augmentation.  Popliteal Vein: No evidence of thrombus. Normal compressibility, respiratory phasicity and response to augmentation.  Calf Veins: No evidence of thrombus. Normal compressibility and flow on color Doppler imaging.  Superficial Great Saphenous Vein: Occlusion of the right great saphenous vein, in the mid and distal thigh crossing the knee.  Other Findings:   None.  LEFT LOWER EXTREMITY  Common Femoral Vein: No evidence of thrombus. Normal compressibility, respiratory phasicity and response to augmentation.  Saphenofemoral Junction: No evidence of thrombus. Normal compressibility and flow on color Doppler imaging.  Profunda Femoral Vein: No evidence of thrombus. Normal compressibility and flow on color Doppler imaging.  Femoral Vein: No evidence of thrombus. Normal compressibility, respiratory phasicity and response to augmentation.  Popliteal Vein: No evidence of thrombus. Normal compressibility, respiratory phasicity and response to augmentation.  Calf Veins: No evidence of thrombus. Normal compressibility and flow on color Doppler imaging.  Superficial Great Saphenous Vein: No evidence of thrombus. Normal compressibility and flow on color Doppler imaging.  Other Findings:  None.  IMPRESSION: Sonographic survey of the bilateral lower extremities negative for DVT.  Superficial thrombophlebitis of the right great saphenous vein   Electronically Signed By: Gilmer Mor D.O. On: 11/20/2019 14:25  No results found for this or any previous visit.  No results found for this or any previous visit.  No results found for this or any previous visit.  No results found for this or any previous visit.  No results found for this or any previous visit.  No results found for this or any previous visit.   Assessment & Plan:    1. Pyelonephritis -STAT CT abd/pelvis w/wo to rule out renal abscess due to persistent fever after pyelonephritis - Urinalysis, Routine w reflex microscopic   No follow-ups on file.  Wilkie Aye, MD  [facility]

## 2022-06-01 ENCOUNTER — Telehealth: Payer: Self-pay

## 2022-06-01 MED ORDER — CEFUROXIME AXETIL 500 MG PO TABS: 500.0000 mg | ORAL_TABLET | Freq: Two times a day (BID) | ORAL | 0 refills | Status: DC

## 2022-06-01 NOTE — Telephone Encounter (Signed)
Patient called stating that she had a fever of 101.2 and after taking Tylenol it went down to 99.8. Patient is asking if Dr. Ronne Binning would send in a prescription for the antibiotic she was on when she was in the hospital recently.  PATIENT USES [facility] IN [place]

## 2022-06-01 NOTE — Telephone Encounter (Signed)
Reviewed with Dr. Ronne Binning Medication was sent to pharmacy this am by Dr. Ronne Binning. Called patient and notified. Voiced understanding.

## 2022-06-02 ENCOUNTER — Telehealth: Payer: Self-pay | Admitting: Urology

## 2022-06-02 ENCOUNTER — Other Ambulatory Visit: Payer: Self-pay

## 2022-06-02 ENCOUNTER — Ambulatory Visit (HOSPITAL_COMMUNITY): Payer: 59

## 2022-06-02 MED ORDER — CEFUROXIME AXETIL 500 MG PO TABS: 500.0000 mg | ORAL_TABLET | Freq: Two times a day (BID) | ORAL | 0 refills | Status: DC

## 2022-06-02 NOTE — Telephone Encounter (Signed)
Antibiotics need to be called into [facility] on [address] [facility] does not cost her anything and [facility] $30.  Please call patient when this has been sent.

## 2022-06-02 NOTE — Telephone Encounter (Signed)
Patient called and notified of prescription sent to [facility].

## 2022-06-03 ENCOUNTER — Ambulatory Visit
Admission: RE | Admit: 2022-06-03 | Discharge: 2022-06-03 | Disposition: A | Discharge status: Home / Self Care | Payer: 59 | Source: Ambulatory Visit | Attending: Urology | Admitting: Urology

## 2022-06-03 ENCOUNTER — Other Ambulatory Visit: Payer: 59

## 2022-06-03 DIAGNOSIS — N3289 Other specified disorders of bladder: Secondary | ICD-10-CM | POA: Diagnosis not present

## 2022-06-03 DIAGNOSIS — K651 Peritoneal abscess: Secondary | ICD-10-CM | POA: Diagnosis not present

## 2022-06-03 DIAGNOSIS — N12 Tubulo-interstitial nephritis, not specified as acute or chronic: Secondary | ICD-10-CM

## 2022-06-03 DIAGNOSIS — I700 Atherosclerosis of aorta: Secondary | ICD-10-CM | POA: Diagnosis not present

## 2022-06-03 DIAGNOSIS — Z872 Personal history of diseases of the skin and subcutaneous tissue: Secondary | ICD-10-CM | POA: Diagnosis not present

## 2022-06-03 MED ORDER — IOPAMIDOL (ISOVUE-300) INJECTION 61%
100.0000 mL | Freq: Once | INTRAVENOUS | Status: AC | PRN
  Administered 2022-06-03: 100 mL via INTRAVENOUS

## 2022-06-04 ENCOUNTER — Other Ambulatory Visit (HOSPITAL_COMMUNITY): Payer: Self-pay

## 2022-06-04 MED ORDER — GABAPENTIN 600 MG PO TABS
ORAL_TABLET | ORAL | 0 refills | Status: DC
  Filled 2022-06-04: qty 90, 30d supply, fill #0

## 2022-06-04 MED ORDER — TIZANIDINE HCL 4 MG PO TABS
ORAL_TABLET | ORAL | 0 refills | Status: DC
  Filled 2022-06-04: qty 120, 30d supply, fill #0

## 2022-06-04 MED ORDER — ALPRAZOLAM 1 MG PO TABS
ORAL_TABLET | ORAL | 1 refills | Status: DC
  Filled 2022-06-04: qty 60, 30d supply, fill #0
  Filled 2022-07-03: qty 60, 30d supply, fill #1

## 2022-06-04 MED ORDER — MORPHINE SULFATE 15 MG PO TABS
15.0000 mg | ORAL_TABLET | Freq: Two times a day (BID) | ORAL | 0 refills | Status: DC
  Filled 2022-06-04: qty 60, 30d supply, fill #0

## 2022-06-04 MED ORDER — OXYCODONE-ACETAMINOPHEN 10-325 MG PO TABS
ORAL_TABLET | ORAL | 0 refills | Status: DC
  Filled 2022-06-04: qty 120, 30d supply, fill #0

## 2022-06-09 ENCOUNTER — Encounter: Payer: Self-pay | Admitting: Urology

## 2022-06-09 NOTE — Patient Instructions (Signed)
Urinary Tract Infection, Adult A urinary tract infection (UTI) is an infection of any part of the urinary tract. The urinary tract includes: The kidneys. The ureters. The bladder. The urethra. These organs make, store, and get rid of pee (urine) in the body. What are the causes? This infection is caused by germs (bacteria) in your genital area. These germs grow and cause swelling (inflammation) of your urinary tract. What increases the risk? The following factors may make you more likely to develop this condition: Using a small, thin tube (catheter) to drain pee. Not being able to control when you pee or poop (incontinence). Being female. If you are female, these things can increase the risk: Using these methods to prevent pregnancy: A medicine that kills sperm (spermicide). A device that blocks sperm (diaphragm). Having low levels of a female hormone (estrogen). Being pregnant. You are more likely to develop this condition if: You have genes that add to your risk. You are sexually active. You take antibiotic medicines. You have trouble peeing because of: A prostate that is bigger than normal, if you are female. A blockage in the part of your body that drains pee from the bladder. A kidney stone. A nerve condition that affects your bladder. Not getting enough to drink. Not peeing often enough. You have other conditions, such as: Diabetes. A weak disease-fighting system (immune system). Sickle cell disease. Gout. Injury of the spine. What are the signs or symptoms? Symptoms of this condition include: Needing to pee right away. Peeing small amounts often. Pain or burning when peeing. Blood in the pee. Pee that smells bad or not like normal. Trouble peeing. Pee that is cloudy. Fluid coming from the vagina, if you are female. Pain in the belly or lower back. Other symptoms include: Vomiting. Not feeling hungry. Feeling mixed up (confused). This may be the first symptom in  older adults. Being tired and grouchy (irritable). A fever. Watery poop (diarrhea). How is this treated? Taking antibiotic medicine. Taking other medicines. Drinking enough water. In some cases, you may need to see a specialist. Follow these instructions at home:  Medicines Take over-the-counter and prescription medicines only as told by your doctor. If you were prescribed an antibiotic medicine, take it as told by your doctor. Do not stop taking it even if you start to feel better. General instructions Make sure you: Pee until your bladder is empty. Do not hold pee for a long time. Empty your bladder after sex. Wipe from front to back after peeing or pooping if you are a female. Use each tissue one time when you wipe. Drink enough fluid to keep your pee pale yellow. Keep all follow-up visits. Contact a doctor if: You do not get better after 1-2 days. Your symptoms go away and then come back. Get help right away if: You have very bad back pain. You have very bad pain in your lower belly. You have a fever. You have chills. You feeling like you will vomit or you vomit. Summary A urinary tract infection (UTI) is an infection of any part of the urinary tract. This condition is caused by germs in your genital area. There are many risk factors for a UTI. Treatment includes antibiotic medicines. Drink enough fluid to keep your pee pale yellow. This information is not intended to replace advice given to you by your health care provider. Make sure you discuss any questions you have with your health care provider. Document Revised: 06/14/2020 Document Reviewed: 06/14/2020 Elsevier Patient Education    2023 [employer]  

## 2022-07-03 ENCOUNTER — Other Ambulatory Visit (HOSPITAL_COMMUNITY): Payer: Self-pay

## 2022-07-03 MED ORDER — MORPHINE SULFATE 15 MG PO TABS
15.0000 mg | ORAL_TABLET | Freq: Two times a day (BID) | ORAL | 0 refills | Status: DC
  Filled 2022-07-04: qty 60, 30d supply, fill #0

## 2022-07-03 MED ORDER — OXYCODONE-ACETAMINOPHEN 10-325 MG PO TABS
ORAL_TABLET | ORAL | 0 refills | Status: DC
  Filled 2022-07-04: qty 120, 30d supply, fill #0

## 2022-07-04 ENCOUNTER — Other Ambulatory Visit (HOSPITAL_COMMUNITY): Payer: Self-pay

## 2022-07-30 ENCOUNTER — Other Ambulatory Visit (HOSPITAL_COMMUNITY): Payer: Self-pay

## 2022-07-30 MED ORDER — ALPRAZOLAM 1 MG PO TABS
0.5000 mg | ORAL_TABLET | Freq: Two times a day (BID) | ORAL | 0 refills | Status: AC | PRN
  Filled 2022-07-30 – 2022-08-01 (×2): qty 60, 30d supply, fill #0

## 2022-07-30 MED ORDER — OXYCODONE-ACETAMINOPHEN 10-325 MG PO TABS
1.0000 | ORAL_TABLET | Freq: Four times a day (QID) | ORAL | 0 refills | Status: DC | PRN
  Filled 2022-07-30: qty 120, fill #0
  Filled 2022-08-01: qty 120, 30d supply, fill #0

## 2022-07-30 MED ORDER — MORPHINE SULFATE 15 MG PO TABS
15.0000 mg | ORAL_TABLET | Freq: Two times a day (BID) | ORAL | 0 refills | Status: AC
  Filled 2022-07-30 – 2022-08-01 (×2): qty 60, 30d supply, fill #0

## 2022-07-30 MED ORDER — GABAPENTIN 600 MG PO TABS
600.0000 mg | ORAL_TABLET | Freq: Three times a day (TID) | ORAL | 0 refills | Status: AC
  Filled 2022-07-30: qty 90, 30d supply, fill #0

## 2022-08-01 ENCOUNTER — Other Ambulatory Visit (HOSPITAL_COMMUNITY): Payer: Self-pay

## 2022-08-01 ENCOUNTER — Other Ambulatory Visit: Payer: Self-pay

## 2022-12-07 DIAGNOSIS — M5416 Radiculopathy, lumbar region: Secondary | ICD-10-CM | POA: Diagnosis not present

## 2022-12-25 ENCOUNTER — Emergency Department (HOSPITAL_COMMUNITY)
Admission: EM | Admit: 2022-12-25 | Discharge: 2022-12-26 | Disposition: A | Discharge status: Home / Self Care | Payer: 59 | Attending: Emergency Medicine | Admitting: Emergency Medicine

## 2022-12-25 ENCOUNTER — Other Ambulatory Visit: Payer: Self-pay

## 2022-12-25 ENCOUNTER — Encounter (HOSPITAL_COMMUNITY): Payer: Self-pay | Admitting: *Deleted

## 2022-12-25 DIAGNOSIS — F322 Major depressive disorder, single episode, severe without psychotic features: Secondary | ICD-10-CM

## 2022-12-25 DIAGNOSIS — F32A Depression, unspecified: Secondary | ICD-10-CM | POA: Diagnosis present

## 2022-12-25 DIAGNOSIS — Z1152 Encounter for screening for COVID-19: Secondary | ICD-10-CM | POA: Diagnosis not present

## 2022-12-25 DIAGNOSIS — R45851 Suicidal ideations: Secondary | ICD-10-CM

## 2022-12-25 DIAGNOSIS — Z91040 Latex allergy status: Secondary | ICD-10-CM | POA: Diagnosis not present

## 2022-12-25 DIAGNOSIS — X58XXXA Exposure to other specified factors, initial encounter: Secondary | ICD-10-CM | POA: Diagnosis not present

## 2022-12-25 DIAGNOSIS — T1491XA Suicide attempt, initial encounter: Secondary | ICD-10-CM | POA: Diagnosis not present

## 2022-12-25 LAB — ETHANOL: Alcohol, Ethyl (B): 110 mg/dL — ABNORMAL HIGH (ref <10)

## 2022-12-25 LAB — COMPREHENSIVE METABOLIC PANEL
ALT: 30 U/L (ref 0–44)
AST: 28 U/L (ref 15–41)
Albumin: 4.5 g/dL (ref 3.5–5.0)
Alkaline Phosphatase: 60 U/L (ref 38–126)
Anion gap: 11 (ref 5–15)
BUN: 6 mg/dL (ref 6–20)
CO2: 25 mmol/L (ref 22–32)
Calcium: 9.1 mg/dL (ref 8.9–10.3)
Chloride: 100 mmol/L (ref 98–111)
Creatinine, Ser: 0.65 mg/dL (ref 0.44–1.00)
GFR, Estimated: >60 mL/min (ref >60)
Glucose, Bld: 83 mg/dL (ref 70–99)
Potassium: 4.2 mmol/L (ref 3.5–5.1)
Sodium: 136 mmol/L (ref 135–145)
Total Bilirubin: 0.8 mg/dL (ref 0.3–1.2)
Total Protein: 7.1 g/dL (ref 6.5–8.1)

## 2022-12-25 LAB — CBC
HCT: 46.5 % — ABNORMAL HIGH (ref 36.0–46.0)
Hemoglobin: 15.6 g/dL — ABNORMAL HIGH (ref 12.0–15.0)
MCH: 31.2 pg (ref 26.0–34.0)
MCHC: 33.5 g/dL (ref 30.0–36.0)
MCV: 93 fL (ref 80.0–100.0)
Platelets: 240 10*3/uL (ref 150–400)
RBC: 5 MIL/uL (ref 3.87–5.11)
RDW: 13.6 % (ref 11.5–15.5)
WBC: 7.6 10*3/uL (ref 4.0–10.5)
nRBC: 0 % (ref 0.0–0.2)

## 2022-12-25 LAB — RAPID URINE DRUG SCREEN, HOSP PERFORMED
Amphetamines: NOT DETECTED
Barbiturates: NOT DETECTED
Benzodiazepines: POSITIVE — AB
Cocaine: NOT DETECTED
Opiates: NOT DETECTED
Tetrahydrocannabinol: NOT DETECTED

## 2022-12-25 LAB — SALICYLATE LEVEL: Salicylate Lvl: <7 mg/dL — ABNORMAL LOW (ref 7.0–30.0)

## 2022-12-25 LAB — ACETAMINOPHEN LEVEL: Acetaminophen (Tylenol), Serum: <10 ug/mL — ABNORMAL LOW (ref 10–30)

## 2022-12-25 NOTE — ED Notes (Signed)
Pt wanded in triage, as well pt's husband

## 2022-12-25 NOTE — ED Notes (Signed)
Pt wanded by security. 

## 2022-12-25 NOTE — ED Provider Notes (Signed)
[facility] Provider Note   CSN: [id number] Arrival date & time: 12/25/22  2007     History  Chief Complaint  Patient presents with   V70.1    Shelia Snyder is a 57 y.o. female.  HPI  On Paxil for depression - on chronic pain medicine.  Hx of chronic pain - and according to husband has had a couple of episodes where she lashes out and becomes very verbal and depressed recently - tonight wqas the worst - after finding out that their son needed to have another surgery she went missing and was found within the next 20 to 30 minutes holding a gun to her head, the husband was able to get away from her and that she tried to jump off the railing.  The patient is not very forthcoming with information.  She states "the family would be better off without me"    Home Medications Prior to Admission medications   Medication Sig Start Date End Date Taking? Authorizing Provider  alendronate (FOSAMAX) 70 MG tablet TAKE 1 TABLET(70 MG) BY MOUTH 1 TIME A WEEK 05/06/21   Tysinger, Camelia Eng, PA-C  ALPRAZolam Duanne Moron) 1 MG tablet Take 0.5-1 tablets (0.5-1 mg total) by mouth 2 (two) times daily as needed for anxiety. 07/30/22     DULoxetine (CYMBALTA) 30 MG capsule Take 1 capsule (30 mg total) by mouth 2 (two) times daily. 05/31/20   Tysinger, Camelia Eng, PA-C  Eluxadoline (VIBERZI) 75 MG TABS Take 1 tablet by mouth 2 (two) times daily. 04/27/19   Tysinger, Camelia Eng, PA-C  famotidine (PEPCID) 40 MG tablet TAKE 1 TABLET(40 MG) BY MOUTH DAILY 05/31/20   Tysinger, Camelia Eng, PA-C  gabapentin (NEURONTIN) 600 MG tablet Take 1 tablet (600 mg total) by mouth 3 (three) times daily for neuropathy. 07/30/22     linaclotide (LINZESS) 72 MCG capsule Take 1 capsule (72 mcg total) by mouth daily before breakfast. 05/31/20   Tysinger, Camelia Eng, PA-C  morphine (MSIR) 15 MG tablet Take 1 (one) Tablet by mouth two times daily 07/30/22     oxyCODONE ER (XTAMPZA ER) 9 MG C12A Xtampza ER 9 mg capsule  sprinkle  TAKE 1 CAPSULE BY MOUTH TWICE DAILY    [provider]  oxyCODONE-acetaminophen (PERCOCET) 10-325 MG tablet Take 1 tablet by mouth every 6 (six) hours as needed. 05/06/22     tiZANidine (ZANAFLEX) 4 MG tablet Take 0.5 tablets (2 mg total) by mouth every 8 (eight) hours as needed for muscle spasms. 05/22/22   Barton Dubois, MD  tiZANidine (ZANAFLEX) 4 MG tablet Take 1/2 to 1 tablet by mouth four times daily as needed for muscle spasms 06/04/22     Vitamin D, Ergocalciferol, (DRISDOL) 1.25 MG (50000 UNIT) CAPS capsule Take 1 capsule (50,000 Units total) by mouth once a week. 05/31/20   Tysinger, Camelia Eng, PA-C      Allergies    Bee venom, Sulfa antibiotics, and Latex    Review of Systems   Review of Systems  Psychiatric/Behavioral:  Positive for dysphoric mood, hallucinations and suicidal ideas.   All other systems reviewed and are negative.   Physical Exam Updated Vital Signs BP 114/80 (BP Location: Right Arm)   Pulse 90   Temp 97.8 F (36.6 C) (Temporal)   Resp 16   Ht 1.626 m (5' 4"$ )   Wt 49.9 kg   SpO2 99%   BMI 18.88 kg/m  Physical Exam Vitals and nursing note reviewed.  Constitutional:      General: She is not in acute distress.    Appearance: She is well-developed.  HENT:     Head: Normocephalic and atraumatic.     Mouth/Throat:     Pharynx: No oropharyngeal exudate.  Eyes:     General: No scleral icterus.       Right eye: No discharge.        Left eye: No discharge.     Conjunctiva/sclera: Conjunctivae normal.     Pupils: Pupils are equal, round, and reactive to light.  Neck:     Thyroid: No thyromegaly.     Vascular: No JVD.  Cardiovascular:     Rate and Rhythm: Normal rate and regular rhythm.     Heart sounds: Normal heart sounds. No murmur heard.    No friction rub. No gallop.  Pulmonary:     Effort: Pulmonary effort is normal. No respiratory distress.     Breath sounds: Normal breath sounds. No wheezing or rales.  Abdominal:     General:  Bowel sounds are normal. There is no distension.     Palpations: Abdomen is soft. There is no mass.     Tenderness: There is no abdominal tenderness.  Musculoskeletal:        General: No tenderness. Normal range of motion.     Cervical back: Normal range of motion and neck supple.  Lymphadenopathy:     Cervical: No cervical adenopathy.  Skin:    General: Skin is warm and dry.     Findings: No erythema or rash.  Neurological:     Mental Status: She is alert.     Coordination: Coordination normal.  Psychiatric:     Comments: Severely depressed - flat affect / depressed affect     ED Results / Procedures / Treatments   Labs (all labs ordered are listed, but only abnormal results are displayed) Labs Reviewed  COMPREHENSIVE METABOLIC PANEL  ETHANOL  SALICYLATE LEVEL  ACETAMINOPHEN LEVEL  CBC  RAPID URINE DRUG SCREEN, HOSP PERFORMED  RAPID URINE DRUG SCREEN, HOSP PERFORMED    EKG None  Radiology No results found.  Procedures Procedures    Medications Ordered in ED Medications - No data to display  ED Course/ Medical Decision Making/ A&P                             Medical Decision Making Amount and/or Complexity of Data Reviewed Labs: ordered.   Severely depressed, this seems to be somewhat situational but also has some chronic depression Labs for clearance, vital signs normal Suicide precautions, one-to-one Patient will need to be admitted to a psychiatric facility, IVC papers completed at 9:00 PM  Although labs have not yet been drawn the patient is medically cleared to speak with psychiatry and has the ability to answer questions.        Final Clinical Impression(s) / ED Diagnoses Final diagnoses:  Suicide attempt [facility])  Severe depression [facility])    Rx / [facility] Orders ED Discharge Orders     None         Noemi Chapel, MD 12/25/22 2110

## 2022-12-25 NOTE — ED Notes (Signed)
IVC PAPERWORK FAXED TO THE FOLLOWING NUMBERS @ [id number].  [phone] AND [phone]

## 2022-12-25 NOTE — ED Notes (Signed)
Pt ambulated to the bathroom with sitter and RN. Pt provided urine sample and is changing into purple scrubs

## 2022-12-25 NOTE — ED Triage Notes (Addendum)
Pt's husband states pt with screaming fits and pt upset due to pt's son needs to have surgery again with hardware, son wanting to hold off the surgery.  Pt then began to state how she is useless. Pt got missing and husband found her on very top deck holding a loaded gun and cocked.  Husband was able to get gun from pt and pt got aggressive and upset. Pt then tried to go over the rails of the deck.  Pt admits to that family would be better off without her financially.  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  Pt states she hasn't been feeling herself for months. Pt recently started on Paxil a month ago.   Pt has not had her pain medication or Paxil for today.

## 2022-12-25 NOTE — ED Notes (Addendum)
Pt states, "I'm scared because I do not know what is going to happen." Pt states she endorsed suicidal ideations prior to arrival but does not endorse them at this time. Pt denies wanting to hurt anyone else at this time. Pt appears anxious, depressed, sad, and tearful. Pt is concerned about receiving her paxil and pain medication that she takes daily.

## 2022-12-26 DIAGNOSIS — R45851 Suicidal ideations: Secondary | ICD-10-CM

## 2022-12-26 LAB — RESP PANEL BY RT-PCR (RSV, FLU A&B, COVID)  RVPGX2
Influenza A by PCR: NEGATIVE
Influenza B by PCR: NEGATIVE
Resp Syncytial Virus by PCR: NEGATIVE
SARS Coronavirus 2 by RT PCR: NEGATIVE

## 2022-12-26 MED ORDER — ACETAMINOPHEN 500 MG PO TABS
1000.0000 mg | ORAL_TABLET | Freq: Once | ORAL | Status: AC
  Administered 2022-12-26: 1000 mg via ORAL
  Filled 2022-12-26: qty 2

## 2022-12-26 NOTE — Consult Note (Signed)
Telepsych Consultation   Reason for Consult:  Suicidal Ideation Referring Physician:  Dr Alvino Chapel Location of Patient: [facility] Location of Provider: [facility]  Patient Identification: Shelia Snyder MRN:  [id number] Principal Diagnosis: Suicidal ideation Diagnosis:  Principal Problem:   Suicidal ideation   Total Time spent with patient: 30 minutes  Subjective:   Shelia Snyder is a 57 y.o. female patient admitted with suicidal ideation.  Patient states "I am so embarrassed about what happened yesterday, I would never have killed myself, I did not load the gun.  There has been a lot going on in my son is having a leg surgery and 1-1/2 years ago he almost lost his leg, my husband recently began having seizures 4 months ago."  HPI: Patient is reassessed, virtually, via telepsychiatry monitor, by nurse practitioner.  Chart reviewed and patient discussed with Dr. [place] Abbot on 12/26/2022.  She is seated on hospital stretcher, no apparent distress.  She is alert and oriented, pleasant and cooperative during assessment.  She presents with euthymic mood, congruent affect.  Patient reports feeling overwhelmed by recent stressors including health concerns for both her 51 year old son as well as her husband on yesterday.  [name] has been diagnosed with anxiety and depression.  She is not linked with outpatient psychiatry for medication management currently.  She is prescribed Paxil and alprazolam by primary care provider.  Began Paxil 1 month ago.  Her plan includes follow-up with psychiatry for more sophisticated medication management moving forward.  Patient has been seen for family counseling with her husband, family counseling ended several months ago.  Reviewed plan to consider individual counseling.  She denies history of inpatient psychiatric hospitalization/treatment.  She denies family mental health history.   [name] denies suicidal and homicidal  ideations today.  She easily contracts verbally for safety with this Probation officer.  She states "I am not thinking of hurting myself and I know that I would not have gone through with hurting myself."  She denies previous suicide attempts.  She denies nonsuicidal self-harm behaviors.  She easily contracts verbally for safety with this Probation officer.  Patient denies auditory and visual hallucinations.  There is no evidence of delusional thought content and no indication that patient is responding to internal stimuli.  She denies symptoms of paranoia.  Shelia Snyder resides in [place] with her husband and 30 year old son.  All weapons in home have been removed from the home and secured by her husband.  She is currently not employed.  She denies alcohol substance use.  She reports average appetite and decreased sleep.  She reports she average sleeps 4 to 6 hours per night for many years.  She denies alcohol and substance use.  Patient offered support and encouragement.  She gives verbal consent to speak with her husband, Shelia Snyder phone number [phone].  Patient identifies her husband as her primary emotional support.  Spoke with patient's husband, Shelia Snyder, who denies safety concerns.  He confirms he has secured all weapons away from the family home.  He verbalizes understanding of treatment plan to include outpatient psychiatry follow-up and strict return precautions.   Patient and family are educated and verbalize understanding of mental health resources and other crisis services in the community. They are instructed to call 911 and present to the nearest emergency room should patient experience any suicidal/homicidal ideation, auditory/visual/hallucinations, or detrimental worsening of mental health condition.     Past Psychiatric History: depression, anxiety, insomnia  Risk to Self:  denies  Risk to Others:   denies Prior Inpatient Therapy:  denies  Prior Outpatient Therapy:   family counseling ended approx one  year ago  Past Medical History:  Past Medical History:  Diagnosis Date   Anxiety    Chronic low back pain    Degeneration of lumbar intervertebral disc    Degenerative spondylolisthesis    Depression    Diarrhea    Elevated liver enzymes    IBS (irritable bowel syndrome) 09/04/2011   Left leg pain    related to L4-L5 ruptured disc   Osteoporosis     Past Surgical History:  Procedure Laterality Date   ABDOMINAL EXPOSURE N/A 11/08/2019   Procedure: ABDOMINAL EXPOSURE;  Surgeon: Rosetta Posner, MD;  Location: [facility];  Service: Vascular;  Laterality: N/A;   ANTERIOR LUMBAR FUSION N/A 11/08/2019   Procedure: ANTERIOR LUMBAR FUSION LUMBAR FIVE-SACRAL ONE;  Surgeon: Melina Schools, MD;  Location: [facility];  Service: Orthopedics;  Laterality: N/A;   BACK SURGERY  2000   L4-L5 discectomy   BREAST LUMPECTOMY     benign, right   CHOLECYSTECTOMY     COLONOSCOPY  09/05/2011   Procedure: COLONOSCOPY;  Surgeon: Rogene Houston, MD;  Location: [facility];  Service: Endoscopy;  Laterality: N/A;   COLONOSCOPY  2016   melanosis coli, Dr. Doristine Mango   OOPHORECTOMY     right, abnormal bleeding after hysterectomy   TOTAL ABDOMINAL HYSTERECTOMY     has 1 ovary, hx/o heavy bleeding, s/p ablation   Family History:  Family History  Problem Relation Age of Onset   Heart attack Mother 26       MI   Aneurysm Mother        abdominal, smoker   Hypertension Mother    Heart disease Mother 40   COPD Father    Diabetes Maternal Uncle    Cancer Maternal Grandmother        colon   Cancer Paternal Grandmother        breast   Stroke Neg Hx    Family Psychiatric  History: denies Social History:  Social History   Substance and Sexual Activity  Alcohol Use No     Social History   Substance and Sexual Activity  Drug Use No    Social History   Socioeconomic History   Marital status: Married    Spouse name: Not on file   Number of children: Not on file   Years of education: Not on  file   Highest education level: Not on file  Occupational History   Not on file  Tobacco Use   Smoking status: Former    Packs/day: 1.00    Years: 19.00    Total pack years: 19.00    Types: Cigarettes   Smokeless tobacco: Never  Vaping Use   Vaping Use: Never used  Substance and Sexual Activity   Alcohol use: No   Drug use: No   Sexual activity: Yes    Birth control/protection: None  Other Topics Concern   Not on file  Social History Narrative   Married, husband Shelia Snyder, son Thedore Mins, works at [employer], exercise with walking.   10/2019   Social Determinants of Health   Financial Resource Strain: Not on file  Food Insecurity: Not on file  Transportation Needs: Not on file  Physical Activity: Not on file  Stress: Not on file  Social Connections: Not on file   Additional Social History:    Allergies:   Allergies  Allergen Reactions   Bee Venom Anaphylaxis   Sulfa Antibiotics Anaphylaxis   Latex Rash    Labs:  Results for orders placed or performed during the hospital encounter of 12/25/22 (from the past 48 hour(s))  Urine rapid drug screen (hosp performed)     Status: Abnormal   Collection Time: 12/25/22  9:15 PM  Result Value Ref Range   Opiates NONE DETECTED NONE DETECTED   Cocaine NONE DETECTED NONE DETECTED   Benzodiazepines POSITIVE (A) NONE DETECTED   Amphetamines NONE DETECTED NONE DETECTED   Tetrahydrocannabinol NONE DETECTED NONE DETECTED   Barbiturates NONE DETECTED NONE DETECTED    Comment: (NOTE) DRUG SCREEN FOR MEDICAL PURPOSES ONLY.  IF CONFIRMATION IS NEEDED FOR ANY PURPOSE, NOTIFY LAB WITHIN 5 DAYS.  LOWEST DETECTABLE LIMITS FOR URINE DRUG SCREEN Drug Class                     Cutoff (ng/mL) Amphetamine and metabolites    1000 Barbiturate and metabolites    200 Benzodiazepine                 200 Opiates and metabolites        300 Cocaine and metabolites        300 THC                            50 Performed at [facility] [place], [place] [address]   Comprehensive metabolic panel     Status: None   Collection Time: 12/25/22  9:33 PM  Result Value Ref Range   Sodium 136 135 - 145 mmol/L   Potassium 4.2 3.5 - 5.1 mmol/L   Chloride 100 98 - 111 mmol/L   CO2 25 22 - 32 mmol/L   Glucose, Bld 83 70 - 99 mg/dL    Comment: Glucose reference range applies only to samples taken after fasting for at least 8 hours.   BUN 6 6 - 20 mg/dL   Creatinine, Ser 0.65 0.44 - 1.00 mg/dL   Calcium 9.1 8.9 - 10.3 mg/dL   Total Protein 7.1 6.5 - 8.1 g/dL   Albumin 4.5 3.5 - 5.0 g/dL   AST 28 15 - 41 U/L   ALT 30 0 - 44 U/L   Alkaline Phosphatase 60 38 - 126 U/L   Total Bilirubin 0.8 0.3 - 1.2 mg/dL   GFR, Estimated >60 >60 mL/min    Comment: (NOTE) Calculated using the CKD-EPI Creatinine Equation (2021)    Anion gap 11 5 - 15    Comment: Performed at [facility], [address] [place], [place] [address]  Ethanol     Status: Abnormal   Collection Time: 12/25/22  9:33 PM  Result Value Ref Range   Alcohol, Ethyl (B) 110 (H) <10 mg/dL    Comment: (NOTE) Lowest detectable limit for serum alcohol is 10 mg/dL.  For medical purposes only. Performed at [facility], [address] [place], [place] [id number]   Salicylate level     Status: Abnormal   Collection Time: 12/25/22  9:33 PM  Result Value Ref Range   Salicylate Lvl Q000111Q (L) 7.0 - 30.0 mg/dL    Comment: Performed at [facility], [address], [place], [place] [address]  Acetaminophen level     Status: Abnormal   Collection Time: 12/25/22  9:33 PM  Result Value Ref Range   Acetaminophen (Tylenol), Serum <10 (L) 10 - 30  ug/mL    Comment: (NOTE) Therapeutic concentrations vary significantly. A range of 10-30 ug/mL  may be an effective concentration for many patients. However, some  are best treated at concentrations outside of this range. Acetaminophen concentrations >150 ug/mL at 4 hours after ingestion  and >50 ug/mL at 12 hours after ingestion are often  associated with  toxic reactions.  Performed at [facility], [address], [place], [place] [address]   cbc     Status: Abnormal   Collection Time: 12/25/22  9:33 PM  Result Value Ref Range   WBC 7.6 4.0 - 10.5 K/uL   RBC 5.00 3.87 - 5.11 MIL/uL   Hemoglobin 15.6 (H) 12.0 - 15.0 g/dL   HCT 46.5 (H) 36.0 - 46.0 %   MCV 93.0 80.0 - 100.0 fL   MCH 31.2 26.0 - 34.0 pg   MCHC 33.5 30.0 - 36.0 g/dL   RDW 13.6 11.5 - 15.5 %   Platelets 240 150 - 400 K/uL   nRBC 0.0 0.0 - 0.2 %    Comment: Performed at [facility], [address] [place], [place] [address]  Resp panel by RT-PCR (RSV, Flu A&B, Covid) Anterior Nasal Swab     Status: None   Collection Time: 12/26/22 10:47 AM   Specimen: Anterior Nasal Swab  Result Value Ref Range   SARS Coronavirus 2 by RT PCR NEGATIVE NEGATIVE    Comment: (NOTE) SARS-CoV-2 target nucleic acids are NOT DETECTED.  The SARS-CoV-2 RNA is generally detectable in upper respiratory specimens during the acute phase of infection. The lowest concentration of SARS-CoV-2 viral copies this assay can detect is 138 copies/mL. A negative result does not preclude SARS-Cov-2 infection and should not be used as the sole basis for treatment or other patient management decisions. A negative result may occur with  improper specimen collection/handling, submission of specimen other than nasopharyngeal swab, presence of viral mutation(s) within the areas targeted by this assay, and inadequate number of viral copies(<138 copies/mL). A negative result must be combined with clinical observations, patient history, and epidemiological information. The expected result is Negative.  Fact Sheet for Patients:  EntrepreneurPulse.com.au  Fact Sheet for Healthcare Providers:  IncredibleEmployment.be  This test is no t yet approved or cleared by the [place] FDA and  has been authorized for detection and/or diagnosis of SARS-CoV-2 by FDA  under an Emergency Use Authorization (EUA). This EUA will remain  in effect (meaning this test can be used) for the duration of the COVID-19 declaration under Section 564(b)(1) of the Act, 21 U.S.C.section 360bbb-3(b)(1), unless the authorization is terminated  or revoked sooner.       Influenza A by PCR NEGATIVE NEGATIVE   Influenza B by PCR NEGATIVE NEGATIVE    Comment: (NOTE) The Xpert Xpress SARS-CoV-2/FLU/RSV plus assay is intended as an aid in the diagnosis of influenza from Nasopharyngeal swab specimens and should not be used as a sole basis for treatment. Nasal washings and aspirates are unacceptable for Xpert Xpress SARS-CoV-2/FLU/RSV testing.  Fact Sheet for Patients: EntrepreneurPulse.com.au  Fact Sheet for Healthcare Providers: IncredibleEmployment.be  This test is not yet approved or cleared by the [place] FDA and has been authorized for detection and/or diagnosis of SARS-CoV-2 by FDA under an Emergency Use Authorization (EUA). This EUA will remain in effect (meaning this test can be used) for the duration of the COVID-19 declaration under Section 564(b)(1) of the Act, 21 U.S.C. section 360bbb-3(b)(1), unless the authorization is terminated or revoked.  Resp Syncytial Virus by PCR NEGATIVE NEGATIVE    Comment: (NOTE) Fact Sheet for Patients: EntrepreneurPulse.com.au  Fact Sheet for Healthcare Providers: IncredibleEmployment.be  This test is not yet approved or cleared by the [place] FDA and has been authorized for detection and/or diagnosis of SARS-CoV-2 by FDA under an Emergency Use Authorization (EUA). This EUA will remain in effect (meaning this test can be used) for the duration of the COVID-19 declaration under Section 564(b)(1) of the Act, 21 U.S.C. section 360bbb-3(b)(1), unless the authorization is terminated or revoked.  Performed at [facility], [address], [place], [place] [address]     Medications:  No current facility-administered medications for this encounter.   Current Outpatient Medications  Medication Sig Dispense Refill   alendronate (FOSAMAX) 70 MG tablet TAKE 1 TABLET(70 MG) BY MOUTH 1 TIME A WEEK 12 tablet 0   ALPRAZolam (XANAX) 1 MG tablet Take 0.5-1 tablets (0.5-1 mg total) by mouth 2 (two) times daily as needed for anxiety. 60 tablet 0   DULoxetine (CYMBALTA) 30 MG capsule Take 1 capsule (30 mg total) by mouth 2 (two) times daily. 180 capsule 1   Eluxadoline (VIBERZI) 75 MG TABS Take 1 tablet by mouth 2 (two) times daily. 180 tablet 3   famotidine (PEPCID) 40 MG tablet TAKE 1 TABLET(40 MG) BY MOUTH DAILY 90 tablet 3   gabapentin (NEURONTIN) 600 MG tablet Take 1 tablet (600 mg total) by mouth 3 (three) times daily for neuropathy. 90 tablet 0   linaclotide (LINZESS) 72 MCG capsule Take 1 capsule (72 mcg total) by mouth daily before breakfast. 30 capsule 0   morphine (MSIR) 15 MG tablet Take 1 (one) Tablet by mouth two times daily 60 tablet 0   oxyCODONE ER (XTAMPZA ER) 9 MG C12A Xtampza ER 9 mg capsule sprinkle  TAKE 1 CAPSULE BY MOUTH TWICE DAILY     oxyCODONE-acetaminophen (PERCOCET) 10-325 MG tablet Take 1 tablet by mouth every 6 (six) hours as needed. 120 tablet 0   tiZANidine (ZANAFLEX) 4 MG tablet Take 0.5 tablets (2 mg total) by mouth every 8 (eight) hours as needed for muscle spasms.     tiZANidine (ZANAFLEX) 4 MG tablet Take 1/2 to 1 tablet by mouth four times daily as needed for muscle spasms 120 tablet 0   Vitamin D, Ergocalciferol, (DRISDOL) 1.25 MG (50000 UNIT) CAPS capsule Take 1 capsule (50,000 Units total) by mouth once a week. 12 capsule 3    Musculoskeletal: Strength & Muscle Tone: within normal limits Gait & Station: normal Patient leans: N/A          Psychiatric Specialty Exam:  Presentation  General Appearance: No data recorded Eye Contact:No data recorded Speech:No data recorded Speech  Volume:No data recorded Handedness:No data recorded  Mood and Affect  Mood:No data recorded Affect:No data recorded  Thought Process  Thought Processes:No data recorded Descriptions of Associations:No data recorded Orientation:No data recorded Thought Content:No data recorded History of Schizophrenia/Schizoaffective disorder:No  Duration of Psychotic Symptoms:No data recorded Hallucinations:No data recorded Ideas of Reference:No data recorded Suicidal Thoughts:No data recorded Homicidal Thoughts:No data recorded  Sensorium  Memory:No data recorded Judgment:No data recorded Insight:No data recorded  Executive Functions  Concentration:No data recorded Attention Span:No data recorded Recall:No data recorded Fund of Knowledge:No data recorded Language:No data recorded  Psychomotor Activity  Psychomotor Activity:No data recorded  Assets  Assets:No data recorded  Sleep  Sleep:No data recorded   Physical Exam: Physical Exam Vitals and nursing note reviewed.  Constitutional:  Appearance: Normal appearance. She is normal weight.  HENT:     Head: Normocephalic and atraumatic.     Nose: Nose normal.  Cardiovascular:     Rate and Rhythm: Normal rate.  Pulmonary:     Effort: Pulmonary effort is normal.  Musculoskeletal:        General: Normal range of motion.     Cervical back: Normal range of motion.  Skin:    General: Skin is warm and dry.  Neurological:     Mental Status: She is alert and oriented to person, place, and time.  Psychiatric:        Attention and Perception: Attention and perception normal.        Mood and Affect: Mood and affect normal.        Speech: Speech normal.        Behavior: Behavior normal. Behavior is cooperative.        Thought Content: Thought content normal.        Cognition and Memory: Cognition and memory normal.        Judgment: Judgment normal.    Review of Systems  Constitutional: Negative.   HENT: Negative.    Eyes:  Negative.   Respiratory: Negative.    Cardiovascular: Negative.   Gastrointestinal: Negative.   Genitourinary: Negative.   Musculoskeletal: Negative.   Skin: Negative.   Neurological: Negative.   Psychiatric/Behavioral: Negative.     Blood pressure 123/89, pulse 81, temperature 98.6 F (37 C), temperature source Oral, resp. rate 16, height 5' 4"$  (1.626 m), weight 49.9 kg, SpO2 97 %. Body mass index is 18.88 kg/m.  Treatment Plan Summary: Follow up with outpatient psychiatry, resources provided.   Disposition: No evidence of imminent risk to self or others at present.   Patient does not meet criteria for psychiatric inpatient admission. Supportive therapy provided about ongoing stressors. Discussed crisis plan, support from social network, calling 911, coming to the Emergency Department, and calling Suicide Hotline.  This service was provided via telemedicine using a 2-way, interactive audio and video technology.  Names of all persons participating in this telemedicine service and their role in this encounter. Name: Andria Frames Role: Patient  Name: Joey Zipper Role: patient's husband  Name: Beatriz Stallion Role: NP  Name: Dr Dwyane Dee Role: Psychiatrist  Name: Dr Alvino Chapel Role: ED Physician    Lucky Rathke, FNP 12/26/2022 11:43 AM

## 2022-12-26 NOTE — ED Notes (Signed)
Tina A. FNP consulting  with patient at present

## 2022-12-26 NOTE — BH Assessment (Addendum)
Comprehensive Clinical Assessment (CCA) Note  12/26/2022 Shelia Snyder [id number]  Disposition: Quintella Reichert, NP, patient meets inpatient criteria. Joann, AC, to review for [facility] placement. Vivien Rota, RN, informed of disposition.   The patient demonstrates the following risk factors for suicide: Chronic risk factors for suicide include: psychiatric disorder of depression and anxiety . Acute risk factors for suicide include: family or marital conflict and loss (financial, interpersonal, professional). Protective factors for this patient include: responsibility to others (children, family) and hope for the future. Considering these factors, the overall suicide risk at this point appears to be high. Patient is not appropriate for outpatient follow up.  Shelia Snyder is a 57 year old female presenting under IVC due to SI with holding and threatening to kill herself with gun and attempting to jump off deck. Patient denied HI, psychosis and alcohol/drug usage.   Patient onset of SI has been "not long", patient unable to recall timeframe. Per EDP note, patient has been having screaming fits, patient feeling useless, patient went to top of deck and held a loaded gun, husband took gun away and then patient became very aggressive and tried to jump over the deck railing. Patient reported stressors includes, son having to having another surgery on his leg and their financial situation. Mother is fearful that this leg surgery may result in patient losing his leg. Patient reported she has chronic pain. 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 Patient reported worsening depressive symptoms. Patient denied psych hospitalizations, suicide attempts and self-harming behaviors. Patient reported poor sleep of 4-6 hours due to chronic pain management. Patient reported poor appetite stating she has lost 40lbs since  October 2023.   Per IVC, petitioner EDP After episode of severe depression, patient tried to shoot herself and jump off of a building.   Patient was seeing a therapist at [place], however she had to stop due to inability to afford. Patient is currently being prescribed Paxil by her PCP. Patient reported feeling like the medicine is helping a little.   Patient resides with husband and 75 year old son. Patient is currently unemployed. Patient reported her husband locked gun away and that she no longer has access. Patient was depressed and cooperative during assessment.    [place] ED from 12/25/2022 in [facility] Emergency Department at [facility] ED to Hosp-Admission (Discharged) from 05/20/2022 in [place] CATEGORY High Risk No Risk        Chief Complaint:  Chief Complaint  Patient presents with   Depression   Suicidal   Visit Diagnosis:  Major depressive disorder  CCA Screening, Triage and Referral (STR)  Patient Reported Information How did you hear about [place]? Legal System  What Is the Reason for Your Visit/Call Today? IVC, SI with threatening to shoot self with gun and then attempted to jump off deck.  How Long Has This Been Causing You Problems? 1-6 months  What Do You Feel Would Help You the Most Today? Treatment for Depression or other mood problem   Have You Recently Had Any Thoughts About Hurting Yourself? Yes  Are You Planning to Commit Suicide/Harm Yourself At This time? Yes   [place] ED from 12/25/2022 in [facility] Emergency Department at [facility] ED to Hosp-Admission (Discharged) from 05/20/2022 in [place] CATEGORY High Risk No Risk  Have you Recently Had Thoughts About [place]? Yes  Are You Planning to Harm Someone at This Time? No  Explanation: denied   Have You Used Any Alcohol or Drugs in the Past 24 Hours? No  What Did You  Use and How Much? denied   Do You Currently Have a Therapist/Psychiatrist? No  Name of Therapist/Psychiatrist: Name of Therapist/Psychiatrist: denied   Have You Been Recently Discharged From Any Office Practice or Programs? No  Explanation of Discharge From Practice/Program: denied     CCA Screening Triage Referral Assessment Type of Contact: Tele-Assessment  Telemedicine Service Delivery:   Is this Initial or Reassessment? Is this Initial or Reassessment?: Initial Assessment  Date Telepsych consult ordered in [facility]:  Date Telepsych consult ordered in [facility]: 12/25/22  Time Telepsych consult ordered in [facility]:  Time Telepsych consult ordered in [facility]: 2107  Location of Assessment: [facility]  Provider Location: [facility] Assessment Services   Collateral Involvement: none reported   Does Patient Have a [place]? No  Legal Guardian Contact Information: n/a  Copy of Legal Guardianship Form: -- (n/a)  Legal Guardian Notified of Arrival: -- (n/a)  Legal Guardian Notified of Pending Discharge: -- (n/a)  If Minor and Not Living with Parent(s), Who has Custody? n/a  Is CPS involved or ever been involved? Never  Is APS involved or ever been involved? Never   Patient Determined To Be At Risk for Harm To Self or Others Based on Review of Patient Reported Information or Presenting Complaint? Yes, for Self-Harm  Method: Plan with intent and identified person  Availability of Means: In hand or used  Intent: Clearly intends on inflicting harm that could cause death  Notification Required: No need or identified person  Additional Information for Danger to Others Potential: -- (none)  Additional Comments for Danger to Others Potential: n/a  Are There Guns or Other Weapons in Your Home? Yes  Types of Guns/Weapons: hand gun  Are These Weapons Safely Secured?                            Yes  Who Could Verify You Are Able To Have These Secured: Arvid Right,  husband  Do You Have any Outstanding Charges, Pending Court Dates, Parole/Probation? none reported  Contacted To Inform of Risk of Harm To Self or Others: Event organiser; Family/Significant Other: (husband brought patient into to ED and patient then IVC'd)    Does Patient Present under Involuntary Commitment? Yes    [place] of Residence: [place]   Patient Currently Receiving the Following Services: Not Receiving Services   Determination of Need: Emergent (2 hours)   Options For Referral: Outpatient Therapy; Medication Management; Inpatient Hospitalization     CCA Biopsychosocial Patient Reported Schizophrenia/Schizoaffective Diagnosis in Past: No   Strengths: self-awareness   Mental Health Symptoms Depression:   Hopelessness; Fatigue; Sleep (too much or little); Difficulty Concentrating; Change in energy/activity; Increase/decrease in appetite; Worthlessness; Weight gain/loss; Tearfulness   Duration of Depressive symptoms:  Duration of Depressive Symptoms: Greater than two weeks   Mania:   None   Anxiety:    Worrying; Tension; Sleep   Psychosis:   None   Duration of Psychotic symptoms:    Trauma:   None   Obsessions:   None   Compulsions:   None   Inattention:   None   Hyperactivity/Impulsivity:   None   Oppositional/Defiant Behaviors:   None   Emotional Irregularity:  None   Other Mood/Personality Symptoms:   none    Mental Status Exam Appearance and self-care  Stature:   Average   Weight:   Average weight   Clothing:   Age-appropriate   Grooming:   Normal   Cosmetic use:   None   Posture/gait:   Normal   Motor activity:   Not Remarkable   Sensorium  Attention:   Normal   Concentration:   Normal   Orientation:   X5   Recall/memory:   Normal   Affect and Mood  Affect:   Appropriate   Mood:   Depressed; Worthless; Hopeless; Anxious   Relating  Eye contact:   Avoided   Facial expression:    Depressed; Sad; Responsive; Anxious   Attitude toward examiner:   Cooperative   Thought and Language  Speech flow:  Slow   Thought content:   Appropriate to Mood and Circumstances   Preoccupation:   None   Hallucinations:   None   Organization:   Coherent   [employer] of Knowledge:   Average   Intelligence:   Average   Abstraction:   Normal   Judgement:   Poor   Reality Testing:   Distorted   Insight:   Fair   Decision Making:   Impulsive   Social Functioning  Social Maturity:   Responsible   Social Judgement:   Naive   Stress  Stressors:  No data recorded  Coping Ability:   Overwhelmed; Exhausted   Skill Deficits:   Decision making; Self-control   Supports:   Family     Religion: Religion/Spirituality Are You A Religious Person?: Yes How Might This Affect Treatment?: none  Leisure/Recreation: Leisure / Recreation Do You Have Hobbies?: Yes 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  Exercise/Diet: Exercise/Diet Do You Exercise?: No Have You Gained or Lost A Significant Amount of Weight in the Past Six Months?: No Do You Follow a Special Diet?: No Do You Have Any Trouble Sleeping?: Yes Explanation of Sleeping Difficulties: 4-6 hours nightly due to pain management   CCA Employment/Education Employment/Work Situation: Employment / Work Situation Employment Situation: Unemployed Patient's Job has Been Impacted by Current Illness: No Has Patient ever Been in Passenger transport manager?: No  Education: Education Is Patient Currently Attending School?: No Last Grade Completed: 16 Did You Nutritional therapist?: Yes 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 Did You Have An Individualized Education Program (IIEP): No Did You Have Any Difficulty At School?: No Patient's Education Has Been Impacted by Current Illness: No   CCA Family/Childhood History Family and Relationship History: Family  history Marital status: Married Number of Years Married: 48 What types of issues is patient dealing with in the relationship?: financial Additional relationship information: none Does patient have children?: Yes How many children?: 1 How is patient's relationship with their children?: good  Childhood History:  Childhood History By whom was/is the patient raised?: Mother Did patient suffer any verbal/emotional/physical/sexual abuse as a child?: No Did patient suffer from severe childhood neglect?: No Has patient ever been sexually abused/assaulted/raped as an adolescent or adult?: No Was the patient ever a victim of a crime or a disaster?: No Witnessed domestic violence?: No Has patient been affected by domestic violence as an adult?: No       CCA Substance Use Alcohol/Drug Use: Alcohol / Drug Use Pain Medications: see MAR Prescriptions: see MAR Over the Counter: see MAR History of alcohol / drug use?: No history  of alcohol / drug abuse Longest period of sobriety (when/how long): n/a Negative Consequences of Use:  (n/a) Withdrawal Symptoms: None                         ASAM's:  Six Dimensions of Multidimensional Assessment  Dimension 1:  Acute Intoxication and/or Withdrawal Potential:   Dimension 1:  Description of individual's past and current experiences of substance use and withdrawal: n/a  Dimension 2:  Biomedical Conditions and Complications:   Dimension 2:  Description of patient's biomedical conditions and  complications: n/a  Dimension 3:  Emotional, Behavioral, or Cognitive Conditions and Complications:  Dimension 3:  Description of emotional, behavioral, or cognitive conditions and complications: n/a  Dimension 4:  Readiness to Change:  Dimension 4:  Description of Readiness to Change criteria: n/a  Dimension 5:  Relapse, Continued use, or Continued Problem Potential:  Dimension 5:  Relapse, continued use, or continued problem potential critiera  description: n/a  Dimension 6:  Recovery/Living Environment:  Dimension 6:  Recovery/Iiving environment criteria description: n/a  ASAM Severity Score: ASAM's Severity Rating Score: 0  ASAM Recommended Level of Treatment: ASAM Recommended Level of Treatment:  (n/a)   Substance use Disorder (SUD) Substance Use Disorder (SUD)  Checklist Symptoms of Substance Use:  (n/a)  Recommendations for Services/Supports/Treatments: Recommendations for Services/Supports/Treatments Recommendations For Services/Supports/Treatments: Inpatient Hospitalization, Individual Therapy, Medication Management  Discharge Disposition: Discharge Disposition Medical Exam completed: Yes Disposition of Patient: Admit  DSM5 Diagnoses: Patient Active Problem List   Diagnosis Date Noted   UTI (urinary tract infection) 05/20/2022   Chronic pain syndrome 05/20/2022   Depression with anxiety 05/20/2022   Elevated blood-pressure reading without diagnosis of hypertension 05/31/2020   Constipation due to opioid therapy 05/31/2020   S/P lumbar fusion 11/08/2019   Osteoporosis 10/23/2019   Melanosis, colon 10/23/2019   Preop examination 10/23/2019   Chronic diarrhea 10/04/2018   High risk medication use 10/04/2018   Vitamin D deficiency 10/04/2018   Family history of heart disease 07/01/2018   Encounter for health maintenance examination in adult 07/01/2018   Need for influenza vaccination 07/01/2018   Post-menopausal 07/01/2018   Estrogen deficiency 07/01/2018   S/P hysterectomy 07/01/2018   Vaccine counseling 04/28/2018   Decreased pedal pulses 04/28/2018   GERD (gastroesophageal reflux disease) 01/26/2018   Insomnia 01/26/2018   Chronic back pain 10/19/2017   IBS (irritable bowel syndrome) 09/04/2011   Hypokalemia 09/04/2011   Anxiety 09/02/2011     Referrals to Alternative Service(s): Referred to Alternative Service(s):   Place:   Date:   Time:    Referred to Alternative Service(s):   Place:   Date:    Time:    Referred to Alternative Service(s):   Place:   Date:   Time:    Referred to Alternative Service(s):   Place:   Date:   Time:     Venora Maples, [facility]

## 2022-12-26 NOTE — Discharge Instructions (Signed)
In the event of worsening symptoms, patient is instructed to call the crisis hotline, 911 and or go to the nearest ED for appropriate evaluation and treatment of symptoms. Information: -National Suicide Prevention Lifeline 1-800-SUICIDE or 212-675-9811.  -988 offers 24/7 access to trained crisis counselors who can help people experiencing mental health-related distress. People can call or text 988 or chat 988lifeline.org for themselves or if they are worried about a loved one who may need crisis support.

## 2022-12-26 NOTE — ED Provider Notes (Signed)
  Physical Exam  BP 133/76 (BP Location: Left Arm)   Pulse 90   Temp 97.7 F (36.5 C) (Oral)   Resp 17   Ht 5\' 4"  (1.626 m)   Wt 49.9 kg   SpO2 100%   BMI 18.88 kg/m   Physical Exam  Procedures  Procedures  ED Course / MDM    Medical Decision Making Amount and/or Complexity of Data Reviewed Labs: ordered.  Risk OTC drugs.   Patient's been seen by psychiatry and cleared for discharge.  IVC reversed.       Davonna Belling, MD 12/26/22 1255

## 2022-12-26 NOTE — ED Notes (Signed)
Patient c/o headache, spoke with EDP new order given for Tylenol.

## 2022-12-26 NOTE — ED Notes (Signed)
Spoke with Aura Fey., RN at [facility] stated milieu is elevated in [facility] possible transport to facility in am.

## 2022-12-26 NOTE — ED Notes (Signed)
Patient refused her breakfast tray. Called dietary to obtained toast per her request.

## 2022-12-26 NOTE — ED Notes (Signed)
Pt refused breakfast tray stating "I have never been able to eat the food here".  RN notified.

## 2022-12-26 NOTE — ED Notes (Signed)
Patient and husband in the room awaiting completion of reversal of IVC paperwork. Patient verbalize  understanding

## 2022-12-26 NOTE — ED Notes (Signed)
Spoke with Beatriz Stallion NP, explained that patient is psych cleared. Husband  states that patient is not a safety concern. Informed patient  that she will need to follow- up with outpatient psychiatry and will provide suicide hotline for patient to contact when in crisis. Notified Joey husband regarding discharge planning.

## 2023-07-12 NOTE — Progress Notes (Deleted)
New Patient Note  RE: Shelia Snyder MRN: [id number] DOB: 01/31/1966 Date of Office Visit: 07/13/2023  Consult requested by: Courtney Paris, NP Primary care provider: Courtney Paris, NP  Chief Complaint: No chief complaint on file.  History of Present Illness: I had the pleasure of seeing Shaakira Mohrman for initial evaluation at the [facility] on 07/12/2023. She is a 57 y.o. female, who is referred here by Courtney Paris, NP for the evaluation of ***.  ***  Assessment and Plan: Briony is a 57 y.o. female with: ***  No follow-ups on file.  No orders of the defined types were placed in this encounter.  Lab Orders  No laboratory test(s) ordered today    Other allergy screening: Asthma: {Blank single:19197::"yes","no"} Rhino conjunctivitis: {Blank single:19197::"yes","no"} Food allergy: {Blank single:19197::"yes","no"} Medication allergy: {Blank single:19197::"yes","no"} Hymenoptera allergy: {Blank single:19197::"yes","no"} Urticaria: {Blank single:19197::"yes","no"} Eczema:{Blank single:19197::"yes","no"} History of recurrent infections suggestive of immunodeficency: {Blank single:19197::"yes","no"}  Diagnostics: Spirometry:  Tracings reviewed. Her effort: {Blank single:19197::"Good reproducible efforts.","It was hard to get consistent efforts and there is a question as to whether this reflects a maximal maneuver.","Poor effort, data can not be interpreted."} FVC: ***L FEV1: ***L, ***% predicted FEV1/FVC ratio: ***% Interpretation: {Blank single:19197::"Spirometry consistent with mild obstructive disease","Spirometry consistent with moderate obstructive disease","Spirometry consistent with severe obstructive disease","Spirometry consistent with possible restrictive disease","Spirometry consistent with mixed obstructive and restrictive disease","Spirometry uninterpretable due to technique","Spirometry consistent with normal pattern","No overt abnormalities noted given  today's efforts"}.  Please see scanned spirometry results for details.  Skin Testing: {Blank single:19197::"Select foods","Environmental allergy panel","Environmental allergy panel and select foods","Food allergy panel","None","Deferred due to recent antihistamines use"}. *** Results discussed with patient/family.   Past Medical History: Patient Active Problem List   Diagnosis Date Noted  . Suicidal ideation 12/26/2022  . UTI (urinary tract infection) 05/20/2022  . Chronic pain syndrome 05/20/2022  . Depression with anxiety 05/20/2022  . Elevated blood-pressure reading without diagnosis of hypertension 05/31/2020  . Constipation due to opioid therapy 05/31/2020  . S/P lumbar fusion 11/08/2019  . Osteoporosis 10/23/2019  . Melanosis, colon 10/23/2019  . Preop examination 10/23/2019  . Chronic diarrhea 10/04/2018  . High risk medication use 10/04/2018  . Vitamin D deficiency 10/04/2018  . Family history of heart disease 07/01/2018  . Encounter for health maintenance examination in adult 07/01/2018  . Need for influenza vaccination 07/01/2018  . Post-menopausal 07/01/2018  . Estrogen deficiency 07/01/2018  . S/P hysterectomy 07/01/2018  . Vaccine counseling 04/28/2018  . Decreased pedal pulses 04/28/2018  . GERD (gastroesophageal reflux disease) 01/26/2018  . Insomnia 01/26/2018  . Chronic back pain 10/19/2017  . IBS (irritable bowel syndrome) 09/04/2011  . Hypokalemia 09/04/2011  . Anxiety 09/02/2011   Past Medical History:  Diagnosis Date  . Anxiety   . Chronic low back pain   . Degeneration of lumbar intervertebral disc   . Degenerative spondylolisthesis   . Depression   . Diarrhea   . Elevated liver enzymes   . IBS (irritable bowel syndrome) 09/04/2011  . Left leg pain    related to L4-L5 ruptured disc  . Osteoporosis    Past Surgical History: Past Surgical History:  Procedure Laterality Date  . ABDOMINAL EXPOSURE N/A 11/08/2019   Procedure: ABDOMINAL  EXPOSURE;  Surgeon: Larina Earthly, MD;  Location: [facility] OR;  Service: Vascular;  Laterality: N/A;  . ANTERIOR LUMBAR FUSION N/A 11/08/2019   Procedure: ANTERIOR LUMBAR FUSION LUMBAR FIVE-SACRAL ONE;  Surgeon: Venita Lick, MD;  Location: [facility] OR;  Service:  Orthopedics;  Laterality: N/A;  . BACK SURGERY  2000   L4-L5 discectomy  . BREAST LUMPECTOMY     benign, right  . CHOLECYSTECTOMY    . COLONOSCOPY  09/05/2011   Procedure: COLONOSCOPY;  Surgeon: Malissa Hippo, MD;  Location: [facility] SUITE;  Service: Endoscopy;  Laterality: N/A;  . COLONOSCOPY  2016   melanosis coli, Dr. Jones Skene  . OOPHORECTOMY     right, abnormal bleeding after hysterectomy  . TOTAL ABDOMINAL HYSTERECTOMY     has 1 ovary, hx/o heavy bleeding, s/p ablation   Medication List:  Current Outpatient Medications  Medication Sig Dispense Refill  . alendronate (FOSAMAX) 70 MG tablet TAKE 1 TABLET(70 MG) BY MOUTH 1 TIME A WEEK 12 tablet 0  . ALPRAZolam (XANAX) 1 MG tablet Take 0.5-1 tablets (0.5-1 mg total) by mouth 2 (two) times daily as needed for anxiety. 60 tablet 0  . DULoxetine (CYMBALTA) 30 MG capsule Take 1 capsule (30 mg total) by mouth 2 (two) times daily. 180 capsule 1  . Eluxadoline (VIBERZI) 75 MG TABS Take 1 tablet by mouth 2 (two) times daily. 180 tablet 3  . famotidine (PEPCID) 40 MG tablet TAKE 1 TABLET(40 MG) BY MOUTH DAILY 90 tablet 3  . gabapentin (NEURONTIN) 600 MG tablet Take 1 tablet (600 mg total) by mouth 3 (three) times daily for neuropathy. 90 tablet 0  . linaclotide (LINZESS) 72 MCG capsule Take 1 capsule (72 mcg total) by mouth daily before breakfast. 30 capsule 0  . morphine (MSIR) 15 MG tablet Take 1 (one) Tablet by mouth two times daily 60 tablet 0  . oxyCODONE ER (XTAMPZA ER) 9 MG C12A Xtampza ER 9 mg capsule sprinkle  TAKE 1 CAPSULE BY MOUTH TWICE DAILY    . oxyCODONE-acetaminophen (PERCOCET) 10-325 MG tablet Take 1 tablet by mouth every 6 (six) hours as needed. 120 tablet 0  .  tiZANidine (ZANAFLEX) 4 MG tablet Take 0.5 tablets (2 mg total) by mouth every 8 (eight) hours as needed for muscle spasms.    Marland Kitchen tiZANidine (ZANAFLEX) 4 MG tablet Take 1/2 to 1 tablet by mouth four times daily as needed for muscle spasms 120 tablet 0  . Vitamin D, Ergocalciferol, (DRISDOL) 1.25 MG (50000 UNIT) CAPS capsule Take 1 capsule (50,000 Units total) by mouth once a week. 12 capsule 3   No current facility-administered medications for this visit.   Allergies: Allergies  Allergen Reactions  . Bee Venom Anaphylaxis  . Sulfa Antibiotics Anaphylaxis  . Latex Rash   Social History: Social History   Socioeconomic History  . Marital status: Married    Spouse name: Not on file  . Number of children: Not on file  . Years of education: Not on file  . Highest education level: Not on file  Occupational History  . Not on file  Tobacco Use  . Smoking status: Former    Current packs/day: 1.00    Average packs/day: 1 pack/day for 19.0 years (19.0 ttl pk-yrs)    Types: Cigarettes  . Smokeless tobacco: Never  Vaping Use  . Vaping status: Never Used  Substance and Sexual Activity  . Alcohol use: No  . Drug use: No  . Sexual activity: Yes    Birth control/protection: None  Other Topics Concern  . Not on file  Social History Narrative   Married, husband Aurelio Brash, son Ian Malkin, works at [employer], exercise with walking.   10/2019   Social Determinants of Health   Financial Resource Strain: Not  on file  Food Insecurity: Not on file  Transportation Needs: Not on file  Physical Activity: Not on file  Stress: Not on file  Social Connections: Not on file   Lives in a ***. Smoking: *** Occupation: ***  Environmental HistorySurveyor, minerals in the house: Copywriter, advertising in the family room: {Blank single:19197::"yes","no"} Carpet in the bedroom: {Blank single:19197::"yes","no"} Heating: {Blank single:19197::"electric","gas","heat pump"} Cooling: {Blank  single:19197::"central","window","heat pump"} Pet: {Blank single:19197::"yes ***","no"}  Family History: Family History  Problem Relation Age of Onset  . Heart attack Mother 76       MI  . Aneurysm Mother        abdominal, smoker  . Hypertension Mother   . Heart disease Mother 14  . COPD Father   . Diabetes Maternal Uncle   . Cancer Maternal Grandmother        colon  . Cancer Paternal Grandmother        breast  . Stroke Neg Hx    Problem                               Relation Asthma                                   *** Eczema                                *** Food allergy                          *** Allergic rhino conjunctivitis     ***  Review of Systems  Constitutional:  Negative for appetite change, chills, fever and unexpected weight change.  HENT:  Negative for congestion and rhinorrhea.   Eyes:  Negative for itching.  Respiratory:  Negative for cough, chest tightness, shortness of breath and wheezing.   Cardiovascular:  Negative for chest pain.  Gastrointestinal:  Negative for abdominal pain.  Genitourinary:  Negative for difficulty urinating.  Skin:  Negative for rash.  Neurological:  Negative for headaches.   Objective: There were no vitals taken for this visit. There is no height or weight on file to calculate BMI. Physical Exam Vitals and nursing note reviewed.  Constitutional:      Appearance: Normal appearance. She is well-developed.  HENT:     Head: Normocephalic and atraumatic.     Right Ear: Tympanic membrane and external ear normal.     Left Ear: Tympanic membrane and external ear normal.     Nose: Nose normal.     Mouth/Throat:     Mouth: Mucous membranes are moist.     Pharynx: Oropharynx is clear.  Eyes:     Conjunctiva/sclera: Conjunctivae normal.  Cardiovascular:     Rate and Rhythm: Normal rate and regular rhythm.     Heart sounds: Normal heart sounds. No murmur heard.    No friction rub. No gallop.  Pulmonary:     Effort: Pulmonary  effort is normal.     Breath sounds: Normal breath sounds. No wheezing, rhonchi or rales.  Musculoskeletal:     Cervical back: Neck supple.  Skin:    General: Skin is warm.     Findings: No rash.  Neurological:     Mental Status: She is alert and oriented to  person, place, and time.  Psychiatric:        Behavior: Behavior normal.  The plan was reviewed with the patient/family, and all questions/concerned were addressed.  It was my pleasure to see Lemon today and participate in her care. Please feel free to contact me with any questions or concerns.  Sincerely,  Wyline Mood, DO Allergy & Immunology  [facility] office: [phone] [facility] office: [phone]

## 2023-07-13 ENCOUNTER — Ambulatory Visit: Payer: Medicaid Other | Admitting: Allergy

## 2023-10-19 ENCOUNTER — Encounter (HOSPITAL_BASED_OUTPATIENT_CLINIC_OR_DEPARTMENT_OTHER): Payer: Self-pay | Admitting: Orthopaedic Surgery

## 2023-10-19 ENCOUNTER — Other Ambulatory Visit: Payer: Self-pay

## 2023-10-19 NOTE — H&P (Cosign Needed Addendum)
PREOPERATIVE H&P  Chief Complaint: RIGHT CLAVICLE FRACTURE  HPI: Shelia Snyder is a 57 y.o. female who is scheduled for, Procedure(s): OPEN REDUCTION INTERNAL FIXATION (ORIF) CLAVICULAR FRACTURE.   Patient has a past medical history significant for chronic narcotic use, insomnia, IBS, anxiety, depression (H/O IVC 12/2022 for SI), chronic UTI, and recent fall resulting in comminuted distal clavicle fracture on the right side.    Symptoms are rated as moderate to severe, and have been worsening.  This is significantly impairing activities of daily living.    Please see clinic note for further details on this patient's care.    She has elected for surgical management.   Past Medical History:  Diagnosis Date   Anxiety    Chronic low back pain    Degeneration of lumbar intervertebral disc    Degenerative spondylolisthesis    Depression    Diarrhea    Elevated liver enzymes    IBS (irritable bowel syndrome) 09/04/2011   Left leg pain    related to L4-L5 ruptured disc   Osteoporosis    Past Surgical History:  Procedure Laterality Date   ABDOMINAL EXPOSURE N/A 11/08/2019   Procedure: ABDOMINAL EXPOSURE;  Surgeon: Larina Earthly, MD;  Location: [facility] OR;  Service: Vascular;  Laterality: N/A;   ANTERIOR LUMBAR FUSION N/A 11/08/2019   Procedure: ANTERIOR LUMBAR FUSION LUMBAR FIVE-SACRAL ONE;  Surgeon: Venita Lick, MD;  Location: [facility] OR;  Service: Orthopedics;  Laterality: N/A;   BACK SURGERY  2000   L4-L5 discectomy   BREAST LUMPECTOMY     benign, right   CHOLECYSTECTOMY     COLONOSCOPY  09/05/2011   Procedure: COLONOSCOPY;  Surgeon: Malissa Hippo, MD;  Location: [facility];  Service: Endoscopy;  Laterality: N/A;   COLONOSCOPY  2016   melanosis coli, Dr. Jones Skene   OOPHORECTOMY     right, abnormal bleeding after hysterectomy   TOTAL ABDOMINAL HYSTERECTOMY     has 1 ovary, hx/o heavy bleeding, s/p ablation   Social History   Socioeconomic History   Marital  status: Married    Spouse name: Not on file   Number of children: Not on file   Years of education: Not on file   Highest education level: Not on file  Occupational History   Not on file  Tobacco Use   Smoking status: Former    Current packs/day: 1.00    Average packs/day: 1 pack/day for 19.0 years (19.0 ttl pk-yrs)    Types: Cigarettes   Smokeless tobacco: Never  Vaping Use   Vaping status: Never Used  Substance and Sexual Activity   Alcohol use: No   Drug use: No   Sexual activity: Yes    Birth control/protection: None  Other Topics Concern   Not on file  Social History Narrative   Married, husband Aurelio Brash, son Ian Malkin, works at [employer], exercise with walking.   10/2019   Social Determinants of Health   Financial Resource Strain: Not on file  Food Insecurity: Not on file  Transportation Needs: Not on file  Physical Activity: Not on file  Stress: Not on file  Social Connections: Not on file   Family History  Problem Relation Age of Onset   Heart attack Mother 38       MI   Aneurysm Mother        abdominal, smoker   Hypertension Mother    Heart disease Mother 41   COPD Father    Diabetes Maternal Uncle  Cancer Maternal Grandmother        colon   Cancer Paternal Grandmother        breast   Stroke Neg Hx    Allergies  Allergen Reactions   Bee Venom Anaphylaxis   Sulfa Antibiotics Anaphylaxis   Latex Rash   Prior to Admission medications   Medication Sig Start Date End Date Taking? Authorizing Provider  alendronate (FOSAMAX) 70 MG tablet TAKE 1 TABLET(70 MG) BY MOUTH 1 TIME A WEEK 05/06/21   Tysinger, Kermit Balo, PA-C  ALPRAZolam Prudy Feeler) 1 MG tablet Take 0.5-1 tablets (0.5-1 mg total) by mouth 2 (two) times daily as needed for anxiety. 07/30/22     DULoxetine (CYMBALTA) 30 MG capsule Take 1 capsule (30 mg total) by mouth 2 (two) times daily. 05/31/20   Tysinger, Kermit Balo, PA-C  Eluxadoline (VIBERZI) 75 MG TABS Take 1 tablet by mouth 2 (two) times daily. 04/27/19    Tysinger, Kermit Balo, PA-C  famotidine (PEPCID) 40 MG tablet TAKE 1 TABLET(40 MG) BY MOUTH DAILY 05/31/20   Tysinger, Kermit Balo, PA-C  gabapentin (NEURONTIN) 600 MG tablet Take 1 tablet (600 mg total) by mouth 3 (three) times daily for neuropathy. 07/30/22     linaclotide (LINZESS) 72 MCG capsule Take 1 capsule (72 mcg total) by mouth daily before breakfast. 05/31/20   Tysinger, Kermit Balo, PA-C  morphine (MSIR) 15 MG tablet Take 1 (one) Tablet by mouth two times daily 07/30/22     oxyCODONE ER (XTAMPZA ER) 9 MG C12A Xtampza ER 9 mg capsule sprinkle  TAKE 1 CAPSULE BY MOUTH TWICE DAILY    [provider]  oxyCODONE-acetaminophen (PERCOCET) 10-325 MG tablet Take 1 tablet by mouth every 6 (six) hours as needed. 05/06/22     tiZANidine (ZANAFLEX) 4 MG tablet Take 0.5 tablets (2 mg total) by mouth every 8 (eight) hours as needed for muscle spasms. 05/22/22   Vassie Loll, MD  tiZANidine (ZANAFLEX) 4 MG tablet Take 1/2 to 1 tablet by mouth four times daily as needed for muscle spasms 06/04/22     Vitamin D, Ergocalciferol, (DRISDOL) 1.25 MG (50000 UNIT) CAPS capsule Take 1 capsule (50,000 Units total) by mouth once a week. 05/31/20   Tysinger, Kermit Balo, PA-C    ROS: All other systems have been reviewed and were otherwise negative with the exception of those mentioned in the HPI and as above.  Physical Exam: General: Alert, no acute distress Cardiovascular: No pedal edema Respiratory: No cyanosis, no use of accessory musculature GI: No organomegaly, abdomen is soft and non-tender Skin: No lesions in the area of chief complaint Neurologic: Sensation intact distally Psychiatric: Patient is competent for consent with normal mood and affect Lymphatic: No axillary or cervical lymphadenopathy  MUSCULOSKELETAL:  Severe TTP to distal clavicle, moderate skin tenting.   Imaging: XR of the right clavicle shows comminuted distal right clavicle fracture  Assessment: RIGHT CLAVICLE FRACTURE  Plan: Plan for  Procedure(s): OPEN REDUCTION INTERNAL FIXATION (ORIF) CLAVICULAR FRACTURE   The risks benefits and alternatives were discussed with the patient including but not limited to the risks of nonoperative treatment, versus surgical intervention including infection, bleeding, nerve injury,  blood clots, cardiopulmonary complications, morbidity, mortality, among others, and they were willing to proceed.   The patient acknowledged the explanation, agreed to proceed with the plan and consent was signed.   Recent Narcotic history: - 11/29: Percocet 5/325 #20 Courtney Paris)  - 11/11: MSIR #30 Courtney Paris) - 11/11: Percocet 10/325 #120 Knox Royalty DO)  Operative Plan: Right ORIF clavicle  Discharge Medications: 4 days only of equivalent of MSIR 15mg  MME of dilauded = 2mg  Q6-8 hrs as needed. No refills. Did receive this month at time of note DVT Prophylaxis: n/a Physical Therapy: outpatient Special Discharge needs: Sling, Ice Man   Corinna Capra, PA-C  10/19/2023 9:02 AM

## 2023-10-21 ENCOUNTER — Other Ambulatory Visit: Payer: Self-pay

## 2023-10-21 ENCOUNTER — Ambulatory Visit (HOSPITAL_BASED_OUTPATIENT_CLINIC_OR_DEPARTMENT_OTHER): Payer: Self-pay | Admitting: Anesthesiology

## 2023-10-21 ENCOUNTER — Ambulatory Visit (HOSPITAL_COMMUNITY): Payer: Medicaid Other

## 2023-10-21 ENCOUNTER — Encounter (HOSPITAL_BASED_OUTPATIENT_CLINIC_OR_DEPARTMENT_OTHER)
Admission: RE | Disposition: A | Discharge status: Home / Self Care | Payer: Self-pay | Source: Home / Self Care | Attending: Orthopaedic Surgery

## 2023-10-21 ENCOUNTER — Encounter (HOSPITAL_BASED_OUTPATIENT_CLINIC_OR_DEPARTMENT_OTHER): Payer: Self-pay | Admitting: Orthopaedic Surgery

## 2023-10-21 ENCOUNTER — Ambulatory Visit (HOSPITAL_BASED_OUTPATIENT_CLINIC_OR_DEPARTMENT_OTHER)
Admission: RE | Admit: 2023-10-21 | Discharge: 2023-10-21 | Disposition: A | Discharge status: Home / Self Care | Payer: Medicaid Other | Attending: Orthopaedic Surgery | Admitting: Orthopaedic Surgery

## 2023-10-21 DIAGNOSIS — S42031A Displaced fracture of lateral end of right clavicle, initial encounter for closed fracture: Secondary | ICD-10-CM | POA: Insufficient documentation

## 2023-10-21 DIAGNOSIS — G4700 Insomnia, unspecified: Secondary | ICD-10-CM | POA: Diagnosis not present

## 2023-10-21 DIAGNOSIS — X58XXXA Exposure to other specified factors, initial encounter: Secondary | ICD-10-CM | POA: Insufficient documentation

## 2023-10-21 DIAGNOSIS — F32A Depression, unspecified: Secondary | ICD-10-CM | POA: Diagnosis not present

## 2023-10-21 DIAGNOSIS — Z87891 Personal history of nicotine dependence: Secondary | ICD-10-CM | POA: Insufficient documentation

## 2023-10-21 DIAGNOSIS — Z87440 Personal history of urinary (tract) infections: Secondary | ICD-10-CM | POA: Insufficient documentation

## 2023-10-21 DIAGNOSIS — Z01818 Encounter for other preprocedural examination: Secondary | ICD-10-CM

## 2023-10-21 DIAGNOSIS — K589 Irritable bowel syndrome without diarrhea: Secondary | ICD-10-CM | POA: Diagnosis not present

## 2023-10-21 DIAGNOSIS — F419 Anxiety disorder, unspecified: Secondary | ICD-10-CM | POA: Insufficient documentation

## 2023-10-21 HISTORY — PX: SHOULDER ARTHROSCOPY: SHX128

## 2023-10-21 HISTORY — PX: ORIF CLAVICULAR FRACTURE: SHX5055

## 2023-10-21 SURGERY — OPEN REDUCTION INTERNAL FIXATION (ORIF) CLAVICULAR FRACTURE
Anesthesia: Regional | Site: Shoulder | Laterality: Right

## 2023-10-21 MED ORDER — CEFAZOLIN SODIUM-DEXTROSE 2-4 GM/100ML-% IV SOLN
INTRAVENOUS | Status: AC
  Filled 2023-10-21: qty 100

## 2023-10-21 MED ORDER — ACETAMINOPHEN 500 MG PO TABS
ORAL_TABLET | ORAL | Status: AC
  Filled 2023-10-21: qty 2

## 2023-10-21 MED ORDER — OXYCODONE HCL 5 MG PO TABS
5.0000 mg | ORAL_TABLET | Freq: Once | ORAL | Status: AC | PRN
  Administered 2023-10-21: 5 mg via ORAL

## 2023-10-21 MED ORDER — DIPHENHYDRAMINE HCL 50 MG/ML IJ SOLN
INTRAMUSCULAR | Status: AC
  Filled 2023-10-21: qty 1

## 2023-10-21 MED ORDER — PHENYLEPHRINE 80 MCG/ML (10ML) SYRINGE FOR IV PUSH (FOR BLOOD PRESSURE SUPPORT)
PREFILLED_SYRINGE | INTRAVENOUS | Status: AC
  Filled 2023-10-21: qty 10

## 2023-10-21 MED ORDER — ACETAMINOPHEN 160 MG/5ML PO SOLN: 325.0000 mg | ORAL | Status: DC | PRN

## 2023-10-21 MED ORDER — EPHEDRINE SULFATE (PRESSORS) 50 MG/ML IJ SOLN
INTRAMUSCULAR | Status: DC | PRN
  Administered 2023-10-21 (×5): 10 mg via INTRAVENOUS

## 2023-10-21 MED ORDER — HYDROMORPHONE HCL 2 MG PO TABS: 2.0000 mg | ORAL_TABLET | Freq: Four times a day (QID) | ORAL | 0 refills | Status: AC | PRN

## 2023-10-21 MED ORDER — BUPIVACAINE HCL (PF) 0.5 % IJ SOLN
INTRAMUSCULAR | Status: DC | PRN
  Administered 2023-10-21: 10 mL via PERINEURAL

## 2023-10-21 MED ORDER — ONDANSETRON HCL 4 MG PO TABS: 4.0000 mg | ORAL_TABLET | Freq: Three times a day (TID) | ORAL | 0 refills | Status: AC | PRN

## 2023-10-21 MED ORDER — ONDANSETRON HCL 4 MG/2ML IJ SOLN
INTRAMUSCULAR | Status: AC
  Filled 2023-10-21: qty 2

## 2023-10-21 MED ORDER — FENTANYL CITRATE (PF) 100 MCG/2ML IJ SOLN
INTRAMUSCULAR | Status: AC
  Filled 2023-10-21: qty 2

## 2023-10-21 MED ORDER — MIDAZOLAM HCL 5 MG/5ML IJ SOLN
INTRAMUSCULAR | Status: DC | PRN
  Administered 2023-10-21: 2 mg via INTRAVENOUS

## 2023-10-21 MED ORDER — FENTANYL CITRATE (PF) 100 MCG/2ML IJ SOLN
INTRAMUSCULAR | Status: DC | PRN
  Administered 2023-10-21 (×2): 50 ug via INTRAVENOUS

## 2023-10-21 MED ORDER — OXYCODONE HCL 5 MG PO TABS
ORAL_TABLET | ORAL | Status: AC
  Filled 2023-10-21: qty 1

## 2023-10-21 MED ORDER — SODIUM CHLORIDE 0.9 % IV SOLN: INTRAVENOUS | Status: DC | PRN

## 2023-10-21 MED ORDER — DEXAMETHASONE SODIUM PHOSPHATE 10 MG/ML IJ SOLN
INTRAMUSCULAR | Status: AC
  Filled 2023-10-21: qty 1

## 2023-10-21 MED ORDER — CEFAZOLIN SODIUM-DEXTROSE 2-4 GM/100ML-% IV SOLN
2.0000 g | INTRAVENOUS | Status: AC
  Administered 2023-10-21: 2 g via INTRAVENOUS

## 2023-10-21 MED ORDER — MIDAZOLAM HCL 2 MG/2ML IJ SOLN
4.0000 mg | Freq: Once | INTRAMUSCULAR | Status: AC
  Administered 2023-10-21: 4 mg via INTRAVENOUS

## 2023-10-21 MED ORDER — EPHEDRINE 5 MG/ML INJ
INTRAVENOUS | Status: AC
  Filled 2023-10-21: qty 5

## 2023-10-21 MED ORDER — CELECOXIB 200 MG PO CAPS
ORAL_CAPSULE | ORAL | Status: AC
  Filled 2023-10-21: qty 1

## 2023-10-21 MED ORDER — ACETAMINOPHEN 500 MG PO TABS
1000.0000 mg | ORAL_TABLET | Freq: Once | ORAL | Status: AC
  Administered 2023-10-21: 1000 mg via ORAL

## 2023-10-21 MED ORDER — ONDANSETRON HCL 4 MG/2ML IJ SOLN
4.0000 mg | Freq: Once | INTRAMUSCULAR | Status: AC | PRN
  Administered 2023-10-21: 4 mg via INTRAVENOUS

## 2023-10-21 MED ORDER — ACETAMINOPHEN 325 MG PO TABS: 325.0000 mg | ORAL_TABLET | ORAL | Status: DC | PRN

## 2023-10-21 MED ORDER — DIPHENHYDRAMINE HCL 50 MG/ML IJ SOLN
25.0000 mg | Freq: Once | INTRAMUSCULAR | Status: AC
  Administered 2023-10-21: 25 mg via INTRAVENOUS

## 2023-10-21 MED ORDER — DEXMEDETOMIDINE HCL IN NACL 80 MCG/20ML IV SOLN
INTRAVENOUS | Status: DC | PRN
  Administered 2023-10-21: 8 ug via INTRAVENOUS

## 2023-10-21 MED ORDER — SUGAMMADEX SODIUM 200 MG/2ML IV SOLN
INTRAVENOUS | Status: DC | PRN
  Administered 2023-10-21: 200 mg via INTRAVENOUS

## 2023-10-21 MED ORDER — MEPERIDINE HCL 25 MG/ML IJ SOLN
INTRAMUSCULAR | Status: AC
  Filled 2023-10-21: qty 1

## 2023-10-21 MED ORDER — MEPERIDINE HCL 25 MG/ML IJ SOLN
6.2500 mg | INTRAMUSCULAR | Status: DC | PRN
  Administered 2023-10-21: 12 mg via INTRAVENOUS

## 2023-10-21 MED ORDER — ROCURONIUM BROMIDE 10 MG/ML (PF) SYRINGE
PREFILLED_SYRINGE | INTRAVENOUS | Status: AC
  Filled 2023-10-21: qty 10

## 2023-10-21 MED ORDER — MIDAZOLAM HCL 2 MG/2ML IJ SOLN
INTRAMUSCULAR | Status: AC
  Filled 2023-10-21: qty 2

## 2023-10-21 MED ORDER — LIDOCAINE HCL (CARDIAC) PF 100 MG/5ML IV SOSY
PREFILLED_SYRINGE | INTRAVENOUS | Status: DC | PRN
  Administered 2023-10-21: 50 mg via INTRAVENOUS

## 2023-10-21 MED ORDER — VANCOMYCIN HCL 1000 MG IV SOLR
INTRAVENOUS | Status: DC | PRN
  Administered 2023-10-21: 1000 mg via TOPICAL

## 2023-10-21 MED ORDER — BUPIVACAINE LIPOSOME 1.3 % IJ SUSP
INTRAMUSCULAR | Status: DC | PRN
  Administered 2023-10-21: 10 mL via PERINEURAL

## 2023-10-21 MED ORDER — ONDANSETRON HCL 4 MG/2ML IJ SOLN
INTRAMUSCULAR | Status: DC | PRN
  Administered 2023-10-21: 4 mg via INTRAVENOUS

## 2023-10-21 MED ORDER — FENTANYL CITRATE (PF) 100 MCG/2ML IJ SOLN
100.0000 ug | Freq: Once | INTRAMUSCULAR | Status: AC
  Administered 2023-10-21: 100 ug via INTRAVENOUS

## 2023-10-21 MED ORDER — PROPOFOL 10 MG/ML IV BOLUS
INTRAVENOUS | Status: DC | PRN
  Administered 2023-10-21: 200 mg via INTRAVENOUS

## 2023-10-21 MED ORDER — PROPOFOL 10 MG/ML IV BOLUS
INTRAVENOUS | Status: AC
  Filled 2023-10-21: qty 20

## 2023-10-21 MED ORDER — ROCURONIUM BROMIDE 100 MG/10ML IV SOLN
INTRAVENOUS | Status: DC | PRN
  Administered 2023-10-21: 50 mg via INTRAVENOUS

## 2023-10-21 MED ORDER — OXYCODONE HCL 5 MG/5ML PO SOLN: 5.0000 mg | Freq: Once | ORAL | Status: AC | PRN

## 2023-10-21 MED ORDER — DEXAMETHASONE SODIUM PHOSPHATE 4 MG/ML IJ SOLN
INTRAMUSCULAR | Status: DC | PRN
  Administered 2023-10-21: 5 mg via INTRAVENOUS

## 2023-10-21 MED ORDER — PHENYLEPHRINE HCL (PRESSORS) 10 MG/ML IV SOLN
INTRAVENOUS | Status: DC | PRN
  Administered 2023-10-21 (×3): 80 ug via INTRAVENOUS
  Administered 2023-10-21: 160 ug via INTRAVENOUS
  Administered 2023-10-21 (×2): 80 ug via INTRAVENOUS
  Administered 2023-10-21: 160 ug via INTRAVENOUS

## 2023-10-21 MED ORDER — FENTANYL CITRATE (PF) 100 MCG/2ML IJ SOLN: 25.0000 ug | INTRAMUSCULAR | Status: DC | PRN

## 2023-10-21 MED ORDER — SODIUM CHLORIDE 0.9 % IR SOLN
Status: DC | PRN
Start: 2023-10-21 — End: 2023-10-21
  Administered 2023-10-21: 1000 mL

## 2023-10-21 MED ORDER — LACTATED RINGERS IV SOLN: INTRAVENOUS | Status: DC

## 2023-10-21 MED ORDER — CELECOXIB 200 MG PO CAPS
200.0000 mg | ORAL_CAPSULE | Freq: Once | ORAL | Status: AC
  Administered 2023-10-21: 200 mg via ORAL

## 2023-10-21 SURGICAL SUPPLY — 59 items
BIT DRILL 2 CANN GRADUATED (BIT) IMPLANT
BIT DRILL 2.5 CANN ENDOSCOPIC (BIT) IMPLANT
BLADE HEX COATED 2.75 (ELECTRODE) IMPLANT
BLADE SURG 10 STRL SS (BLADE) ×1 IMPLANT
BLADE SURG 15 STRL LF DISP TIS (BLADE) ×1 IMPLANT
BNDG COHESIVE 4X5 TAN STRL LF (GAUZE/BANDAGES/DRESSINGS) IMPLANT
BUTTON DIST CLAVICLE PLATE TR (Anchor) IMPLANT
CHLORAPREP W/TINT 26 (MISCELLANEOUS) ×1 IMPLANT
CLSR STERI-STRIP ANTIMIC 1/2X4 (GAUZE/BANDAGES/DRESSINGS) ×1 IMPLANT
COOLER ICEMAN CLASSIC (MISCELLANEOUS) ×1 IMPLANT
DRAPE C-ARM 42X72 X-RAY (DRAPES) ×1 IMPLANT
DRAPE IMP U-DRAPE 54X76 (DRAPES) ×1 IMPLANT
DRAPE INCISE IOBAN 66X45 STRL (DRAPES) ×1 IMPLANT
DRAPE POUCH INSTRU U-SHP 10X18 (DRAPES) ×1 IMPLANT
DRAPE U-SHAPE 76X120 STRL (DRAPES) ×2 IMPLANT
DRSG AQUACEL AG ADV 3.5X 6 (GAUZE/BANDAGES/DRESSINGS) ×1 IMPLANT
ELECT REM PT RETURN 9FT ADLT (ELECTROSURGICAL) ×1
ELECTRODE REM PT RTRN 9FT ADLT (ELECTROSURGICAL) ×1 IMPLANT
GLOVE BIO SURGEON STRL SZ 6.5 (GLOVE) ×1 IMPLANT
GLOVE BIO SURGEON STRL SZ8 (GLOVE) IMPLANT
GLOVE BIOGEL PI IND STRL 6.5 (GLOVE) ×1 IMPLANT
GLOVE BIOGEL PI IND STRL 8 (GLOVE) ×1 IMPLANT
GLOVE ECLIPSE 8.0 STRL XLNG CF (GLOVE) ×1 IMPLANT
GLOVE SURG SS PI 7.0 STRL IVOR (GLOVE) IMPLANT
GOWN STRL REUS W/ TWL LRG LVL3 (GOWN DISPOSABLE) ×1 IMPLANT
GOWN STRL REUS W/TWL XL LVL3 (GOWN DISPOSABLE) ×1 IMPLANT
K-WIRE .062X3 (DISPOSABLE) ×2
KIT SHOULDER STAB MARCO (KITS) ×1 IMPLANT
KWIRE .062X3 (DISPOSABLE) IMPLANT
PACK ARTHROSCOPY DSU (CUSTOM PROCEDURE TRAY) ×1 IMPLANT
PACK BASIN DAY SURGERY FS (CUSTOM PROCEDURE TRAY) ×1 IMPLANT
PAD COLD SHLDR WRAP-ON (PAD) ×1 IMPLANT
PAD ORTHO SHOULDER 7X19 LRG (SOFTGOODS) IMPLANT
PENCIL SMOKE EVACUATOR (MISCELLANEOUS) ×1 IMPLANT
PIN DRILL 3.7MM KNTLS DIST (PIN) IMPLANT
PLATE DISTAL CLAVICLE SH RT SS (Plate) IMPLANT
RESTRAINT HEAD UNIVERSAL NS (MISCELLANEOUS) ×1 IMPLANT
SCREW COMP KREULOCK 2.7X14 (Screw) IMPLANT
SCREW COMP KREULOCK 2.7X16 (Screw) IMPLANT
SCREW COMP KREULOCK 2.7X18 (Screw) IMPLANT
SCREW COMP KREULOCK 3.5X14 (Screw) IMPLANT
SCREW CORTEX 2.7X24MM LP SS (Screw) IMPLANT
SCREW LOW PROFILE 3.5X14 (Screw) IMPLANT
SCREW LOW PROFILE 3.5X16 (Screw) IMPLANT
SHEET MEDIUM DRAPE 40X70 STRL (DRAPES) ×1 IMPLANT
SLEEVE SCD COMPRESS KNEE MED (STOCKING) ×1 IMPLANT
SLING ARM FOAM STRAP LRG (SOFTGOODS) IMPLANT
SPIKE FLUID TRANSFER (MISCELLANEOUS) IMPLANT
SPONGE T-LAP 18X18 ~~LOC~~+RFID (SPONGE) ×1 IMPLANT
SPONGE T-LAP 4X18 ~~LOC~~+RFID (SPONGE) IMPLANT
SUCTION TUBE FRAZIER 10FR DISP (SUCTIONS) ×1 IMPLANT
SUT MNCRL AB 4-0 PS2 18 (SUTURE) IMPLANT
SUT VIC AB 0 CT1 18XCR BRD 8 (SUTURE) ×1 IMPLANT
SUT VIC AB 0 CT1 27XCR 8 STRN (SUTURE) IMPLANT
SUT VIC AB 3-0 SH 27X BRD (SUTURE) ×1 IMPLANT
SUT VICRYL 0 SH 27 (SUTURE) IMPLANT
SYR BULB EAR ULCER 3OZ GRN STR (SYRINGE) ×1 IMPLANT
TOWEL GREEN STERILE FF (TOWEL DISPOSABLE) ×1 IMPLANT
TUBE SUCTION HIGH CAP CLEAR NV (SUCTIONS) ×1 IMPLANT

## 2023-10-21 NOTE — Transfer of Care (Signed)
Immediate Anesthesia Transfer of Care Note  Patient: Shelia Snyder  Procedure(s) Performed: OPEN REDUCTION INTERNAL FIXATION (ORIF) CLAVICULAR FRACTURE (Right: Shoulder) ARTHROSCOPY SHOULDER (Right: Shoulder)  Patient Location: PACU  Anesthesia Type:General and Regional  Level of Consciousness: awake, alert , and oriented  Airway & Oxygen Therapy: Patient Spontanous Breathing and Patient connected to face mask oxygen  Post-op Assessment: Report given to RN and Post -op Vital signs reviewed and stable  Post vital signs: Reviewed and stable  Last Vitals:  Vitals Value Taken Time  BP    Temp    Pulse    Resp    SpO2      Last Pain:  Vitals:   10/21/23 0851  TempSrc: Temporal  PainSc: 8       Patients Stated Pain Goal: 5 (10/21/23 0851)  Complications: No notable events documented.

## 2023-10-21 NOTE — Progress Notes (Signed)
Assisted Dr. Oddono with right, interscalene, ultrasound guided block. Side rails up, monitors on throughout procedure. See vital signs in flow sheet. Tolerated Procedure well. 

## 2023-10-21 NOTE — Op Note (Signed)
Orthopaedic Surgery Operative Note (CSN: [id number])  Shelia Snyder  15-Jul-1966 Date of Surgery: 10/21/2023   Diagnoses:  Right comminuted distal clavicle fracture with skin compromise  Procedure: Right open reduction internal fixation of distal clavicle fracture Right shoulder arthroscopic extensive debridement    Operative Finding Successful completion of the planned procedure.  Patient's fracture is relatively laterally fixation with a lateral plate.  We initially attempted to use coracoid based fixation however based on the plate position itself it was possible to make it safe to pass the coracoid with our 3.75.  Because of this I decided that the downsides of drilling for the coracoid center to a position were higher than the benefit.   If the patient has failure fixation we may consider a pelvic plate versus CC ligament reconstruction bypassing the sutures are reported.  She did have small areas of full-thickness cartilage loss in the glenoid.  There were corresponding areas in the humeral head.  Extensive debridement was performed of the anterior interval, coracoid, glenohumeral cartilage.  Pain control likely be an issue and would very clear that the Dilaudid to the first 4 days resolved prescribed.  At that point she will go back to her standard dosing for contact.  Post-operative plan: The patient will be nonweightbearing for at least 6 weeks in a sling, depending fixation after suture removal week 3 we could consider starting some gentle therapy at that point versus pendulums.  The patient will be discharged home.  DVT prophylaxis not indicated in this ambulatory upper extremity patient without significant risk factors.   Pain control with PRN pain medication preferring oral medicines.  Follow up plan will be scheduled in approximately 7 days for incision check and XR.  Post-Op Diagnosis: Same Surgeons:Primary: Bjorn Pippin, MD Assistants:Deb Lizbeth Bark Location: [facility] OR ROOM  6 Anesthesia: General with regional anesthesia Antibiotics: Ancef 2 g with local vancomycin powder 1 g at the surgical site Tourniquet time:  Estimated Blood Loss: 50 Complications: None Specimens: None Implants: Implant Name Type Inv. Item Serial No. Manufacturer Lot No. LRB No. Used Action  PLATE DISTAL CLAVICLE SH RT SS - [id number] Plate PLATE DISTAL CLAVICLE SH RT SS  [employer]  Right 1 Implanted  SCREW LOW PROFILE 3.5X14 - [id number] Screw SCREW LOW PROFILE 3.5X14  [employer]  Right 1 Implanted  SCREW COMP KREULOCK 2.7X18 - [id number] Screw SCREW COMP KREULOCK 2.7X18  [employer]  Right 1 Implanted  SCREW COMP KREULOCK 2.7X16 - [id number] Screw SCREW COMP KREULOCK 2.7X16  [employer]  Right 2 Implanted  SCREW COMP KREULOCK 2.7X14 - [id number] Screw SCREW COMP KREULOCK 2.7X14  [employer]  Right 1 Implanted  SCREW CORTEX 2.7X24MM LP SS - [id number] Screw SCREW CORTEX 2.7X24MM LP SS  [employer]  Right 1 Implanted  SCREW LOW PROFILE 3.5X16 - [id number] Screw SCREW LOW PROFILE 3.5X16  [employer]  Right 1 Implanted  SCREW COMP KREULOCK 3.5X14 - [id number] Screw SCREW COMP KREULOCK 3.5X14  [employer]  Right 1 Implanted    Indications for Surgery:   Shelia Snyder is a 57 y.o. female with comminuted distal clavicle fracture with skin compromise.  Benefits and risks of operative and nonoperative management were discussed prior to surgery with patient/guardian(s) and informed consent form was completed.  Specific risks including infection, need for additional surgery, nonunion, hardware failure, malunion amongst others.   Procedure:   The patient was identified properly. Informed consent was obtained and the surgical site was marked. The patient was  taken up to suite where general anesthesia was induced.  The patient was positioned beachchair on [employer] table.  The right clavicle was prepped and draped in the usual sterile fashion.  Timeout was performed before the beginning of the  case.  Began with a longitudinal incision over the clavicle fracture site.  We made full-thickness skin flaps and a fascial incision that was out of plane with her skin incision.  Once we had cleared the fracture site we cleared interposed hematoma and periosteum.  We are able to identify that there are multiple fragments of fracture and it was going to be impossible to perform light fixation.  We used a series of clamps to simplify the fracture and K wires to hold a provisional reduction.  We selected an Ar[employer]rex distal clavicle plate.   We provisionally reduced and then used the plate to perform a series of nonlocking and locking screws obtaining 4 locking screws lateral to the fracture site.  The intercalary fragment anterior was fixed with a position with 2.7 millimeter screw.  We then made a posterior portal using spinal needle localization.  We placed our scope.  At that point we were able to perform extensive debridement of humeral bone, humeral cartilage, glenoid bone, glenoid cartilage, middle glenohumeral ligament and the rotator interval.  We placed an anterior portal in standard fashion.  A passport cannula was placed.  We then used the Arthrex Apollo to clear the coracoid and skeletonize it.  Once we had done so we used a ACL style guide from [employer] for the coracoid to try and drill in the center position on the posterior aspect of the coracoid.  Unfortunately based on her plate we were not able to make a appropriate past through the coracoid and I felt that repeated passes through the coracoid and end up leading to a high risk of failure.  We instead aborted this portion of the case.  We irrigated copiously placed local vancomycin powder and closed the incision in a multilayer fashion finishing with nonabsorbable suture.  Sterile dressing was placed.  Patient woken and taken to PACU in a sling.   Shelia Radon, PA-C, present and scrubbed throughout the case, critical for completion in a timely  fashion, and for retraction, instrumentation, closure.

## 2023-10-21 NOTE — Anesthesia Procedure Notes (Signed)
Procedure Name: Intubation Date/Time: 10/21/2023 10:50 AM  Performed by: Cleda Clarks, CRNAPre-anesthesia Checklist: Patient identified, Emergency Drugs available, Suction available and Patient being monitored Patient Re-evaluated:Patient Re-evaluated prior to induction Oxygen Delivery Method: Circle system utilized Preoxygenation: Pre-oxygenation with 100% oxygen Induction Type: IV induction Ventilation: Mask ventilation without difficulty Laryngoscope Size: Miller and 2 Grade View: Grade II Tube type: Oral Tube size: 7.0 mm Number of attempts: 1 Airway Equipment and Method: Stylet and Oral airway Placement Confirmation: ETT inserted through vocal cords under direct vision, positive ETCO2 and breath sounds checked- equal and bilateral Secured at: 21 cm Tube secured with: Tape Dental Injury: Teeth and Oropharynx as per pre-operative assessment

## 2023-10-21 NOTE — Anesthesia Procedure Notes (Signed)
Anesthesia Regional Block: Interscalene brachial plexus block   Pre-Anesthetic Checklist: , timeout performed,  Correct Patient, Correct Site, Correct Laterality,  Correct Procedure, Correct Position, site marked,  Risks and benefits discussed,  Surgical consent,  Pre-op evaluation,  At surgeon's request and post-op pain management  Laterality: Right  Prep: chloraprep       Needles:  Injection technique: Single-shot  Needle Type: Echogenic Stimulator Needle     Needle Length: 5cm  Needle Gauge: 22     Additional Needles:   Procedures:, nerve stimulator,,, ultrasound used (permanent image in chart),,     Nerve Stimulator or Paresthesia:  Response: hand, 0.45 mA  Additional Responses:   Narrative:  Start time: 10/21/2023 9:24 AM End time: 10/21/2023 9:34 AM Injection made incrementally with aspirations every 5 mL.  Performed by: Personally  Anesthesiologist: Bethena Midget, MD  Additional Notes: Functioning IV was confirmed and monitors were applied.  A 50mm 22ga Arrow echogenic stimulator needle was used. Sterile prep and drape,hand hygiene and sterile gloves were used. Ultrasound guidance: relevant anatomy identified, needle position confirmed, local anesthetic spread visualized around nerve(s)., vascular puncture avoided.  Image printed for medical record. Negative aspiration and negative test dose prior to incremental administration of local anesthetic. The patient tolerated the procedure well.

## 2023-10-21 NOTE — Anesthesia Postprocedure Evaluation (Signed)
Anesthesia Post Note  Patient: Zykeria Gamel Wherley  Procedure(s) Performed: OPEN REDUCTION INTERNAL FIXATION (ORIF) CLAVICULAR FRACTURE (Right: Shoulder) ARTHROSCOPY SHOULDER (Right: Shoulder)     Patient location during evaluation: PACU Anesthesia Type: Regional and General Level of consciousness: awake and alert Pain management: pain level controlled Vital Signs Assessment: post-procedure vital signs reviewed and stable Respiratory status: spontaneous breathing, nonlabored ventilation, respiratory function stable and patient connected to nasal cannula oxygen Cardiovascular status: blood pressure returned to baseline and stable Postop Assessment: no apparent nausea or vomiting Anesthetic complications: no   No notable events documented.  Last Vitals:  Vitals:   10/21/23 1245 10/21/23 1315  BP: 126/88 (!) 143/78  Pulse: 100   Resp: 15 16  Temp:  (!) 36.2 C  SpO2: 94% 96%    Last Pain:  Vitals:   10/21/23 1315  TempSrc:   PainSc: 5                  Kaleb Sek

## 2023-10-21 NOTE — Interval H&P Note (Signed)
All questions answered, patient wants to proceed with procedure. ? ?

## 2023-10-21 NOTE — Discharge Instructions (Signed)
Ramond Marrow MD, MPH Alfonse Alpers, PA-C [facility] [address] [phone] (tel)   [phone] (fax)   POST-OPERATIVE INSTRUCTIONS - CLAVICLE   WOUND CARE - Please keep dressing intact until followup.  - You may shower on Post-Op Day #2.  - Keep the dressing dry and do not let it get wet! - Gently pat the area dry.  - Do not soak the shoulder in water.  - Do not go swimming in the pool or ocean until your incision has completely healed (about 4-6 weeks after surgery) - KEEP THE INCISIONS CLEAN AND DRY.   EXERCISES Wear the sling at all times  You may remove the sling for showering, but keep the arm across the chest or in a secondary sling.     It is normal for your fingers/hand to become more swollen after surgery and discolored from bruising.   This will resolve over the first few weeks usually after surgery. Please continue to ambulate and do not stay sitting or lying for too long.  Perform foot and wrist pumps to assist in circulation.  PHYSICAL THERAPY - You will begin physical therapy soon after surgery (unless otherwise specified) - Please call to set up an appointment, if you do not already have one  - Let our office if there are any issues with scheduling your therapy    REGIONAL ANESTHESIA (NERVE BLOCKS) The anesthesia team may have performed a nerve block for you this is a great tool used to minimize pain.   The block may start wearing off overnight (between 8-24 hours postop) When the block wears off, your pain may go from nearly zero to the pain you would have had postop without the block. This is an abrupt transition but nothing dangerous is happening.   This can be a challenging period but utilize your as needed pain medications to try and manage this period. We suggest you use the pain medication the first night prior to going to bed, to ease this transition.  You may take an extra dose of narcotic when this happens if  needed   POST-OP MEDICATIONS- Multimodal approach to pain control In general your pain will be controlled with a combination of substances.  Prescriptions unless otherwise discussed are electronically sent to your pharmacy.  This is a carefully made plan we use to minimize narcotic use.     Dilauded - This is a strong narcotic, to be used only on an "as needed" basis for SEVERE pain. Zofran - take as needed for nausea   FOLLOW-UP - If you develop a Fever (>101.5), Redness or Drainage from the surgical incision site, please call our office to arrange for an evaluation. - Please call the office to schedule a follow-up appointment for your incision check if you do not already have one, 7-10 days post-operatively.  IF YOU HAVE ANY QUESTIONS, PLEASE FEEL FREE TO CALL OUR OFFICE.  HELPFUL INFORMATION  Your arm will be in a sling following surgery. You will be in this sling for the next 3 weeks at least.  I will let you know the exact duration at your follow-up visit. You may walk and ambulate without restrictions.  You may be more comfortable sleeping in a semi-seated position the first few nights following surgery.  Keep a pillow propped under the elbow and forearm for comfort.  If you have a recliner type of chair it might be beneficial.    When dressing, put your operative arm in  the sleeve first.  When getting undressed, take your operative arm out last.  Loose fitting, button-down shirts are recommended.  Often in the first days after surgery you may be more comfortable keeping your operative arm under your shirt and not through the sleeve.  You may return to work/school in the next couple of days when you feel up to it. Desk work and typing in the sling is fine.  We suggest you use the pain medication the first night prior to going to bed, in order to ease any pain when the anesthesia wears off. You should avoid taking pain medications on an empty stomach as it will make you  nauseous.  You should wean off your narcotic medicines as soon as you are able.  Most patients will be off narcotics before their first postop appointment.   Do not drink alcoholic beverages or take illicit drugs when taking pain medications.  In most states it is against the law to drive while your arm is in a sling. And certainly against the law to drive while taking narcotics.  Pain medication may make you constipated.  Below are a few solutions to try in this order: - Decrease the amount of pain medication if you aren't having pain. - Drink lots of decaffeinated fluids. - Drink prune juice and/or each dried prunes  If the first 3 don't work start with additional solutions - Take Colace - an over-the-counter stool softener - Take Senokot - an over-the-counter laxative - Take Miralax - a stronger over-the-counter laxative  For more information including helpful videos and documents visit our website:   [email or web address]

## 2023-10-21 NOTE — Anesthesia Preprocedure Evaluation (Addendum)
Anesthesia Evaluation  Patient identified by MRN, date of birth, ID band Patient awake    Reviewed: Allergy & Precautions, NPO status , Patient's Chart, lab work & pertinent test results  Airway Mallampati: II  TM Distance: >3 FB Neck ROM: Full    Dental  (+) Dental Advisory Given, Teeth Intact   Pulmonary Patient abstained from smoking., former smoker   breath sounds clear to auscultation       Cardiovascular negative cardio ROS  Rhythm:Regular Rate:Normal     Neuro/Psych  PSYCHIATRIC DISORDERS Anxiety Depression    negative neurological ROS     GI/Hepatic Neg liver ROS,GERD  ,,  Endo/Other  negative endocrine ROS    Renal/GU negative Renal ROS     Musculoskeletal  (+) Arthritis ,    Abdominal   Peds  Hematology negative hematology ROS (+)   Anesthesia Other Findings   Reproductive/Obstetrics                               Lab Results  Component Value Date   CREATININE 0.65 12/25/2022   BUN 6 12/25/2022   NA 136 12/25/2022   K 4.2 12/25/2022   CL 100 12/25/2022   CO2 25 12/25/2022    Anesthesia Physical Anesthesia Plan  ASA: 3  Anesthesia Plan: General and Regional   Post-op Pain Management:  Regional for Post-op pain and Minimal or no pain anticipated and Regional block*   Induction: Intravenous  PONV Risk Score and Plan: 3 and Dexamethasone, Ondansetron and Treatment may vary due to age or medical condition  Airway Management Planned: Oral ETT and LMA  Additional Equipment: Arterial line  Intra-op Plan:   Post-operative Plan: Extubation in OR  Informed Consent: I have reviewed the patients History and Physical, chart, labs and discussed the procedure including the risks, benefits and alternatives for the proposed anesthesia with the patient or authorized representative who has indicated his/her understanding and acceptance.     Dental advisory given  Plan  Discussed with: CRNA and Anesthesiologist  Anesthesia Plan Comments: (Remote hx of elevated LFTs due to gallstones. Review of labs shows LFTs have been WNL for the past 2 years.  Spirometry 10/23/19: FVC 102% pred FEV1 100% pred FEV1/FVC 97% pred  TTE 09/02/11: - Left ventricle: The cavity size was normal. Wall thickness  was normal. Systolic function was normal. The estimated  ejection fraction was in the range of 55% to 60%. Wall  motion was normal; there were no regional wall motion  abnormalities. The study is not technically sufficient to  allow evaluation of LV diastolic function.  - Mitral valve: Trivial regurgitation.  - Atrial septum: The septum was thickened. No definite  defect or patent foramen ovale was identified.  - Tricuspid valve: Trivial regurgitation.  - Pericardium, extracardiac: There was no pericardial  effusion.  )        Anesthesia Quick Evaluation

## 2023-10-22 ENCOUNTER — Encounter (HOSPITAL_BASED_OUTPATIENT_CLINIC_OR_DEPARTMENT_OTHER): Payer: Self-pay | Admitting: Orthopaedic Surgery
